# Patient Record
Sex: Male | Born: 1935 | Race: White | Hispanic: No | Marital: Married | Smoking: Former smoker
Health system: Southern US, Community
[De-identification: ages and names within clinical notes are randomized; demographics above are authoritative.]

## PROBLEM LIST (undated history)

## (undated) DIAGNOSIS — D649 Anemia, unspecified: Secondary | ICD-10-CM

## (undated) DIAGNOSIS — I739 Peripheral vascular disease, unspecified: Secondary | ICD-10-CM

## (undated) DIAGNOSIS — I2510 Atherosclerotic heart disease of native coronary artery without angina pectoris: Secondary | ICD-10-CM

## (undated) DIAGNOSIS — I472 Ventricular tachycardia: Secondary | ICD-10-CM

## (undated) DIAGNOSIS — N451 Epididymitis: Secondary | ICD-10-CM

## (undated) DIAGNOSIS — I480 Paroxysmal atrial fibrillation: Secondary | ICD-10-CM

## (undated) DIAGNOSIS — I447 Left bundle-branch block, unspecified: Secondary | ICD-10-CM

## (undated) DIAGNOSIS — D509 Iron deficiency anemia, unspecified: Secondary | ICD-10-CM

## (undated) DIAGNOSIS — I255 Ischemic cardiomyopathy: Secondary | ICD-10-CM

## (undated) DIAGNOSIS — I10 Essential (primary) hypertension: Secondary | ICD-10-CM

## (undated) DIAGNOSIS — E785 Hyperlipidemia, unspecified: Secondary | ICD-10-CM

## (undated) DIAGNOSIS — I509 Heart failure, unspecified: Secondary | ICD-10-CM

## (undated) DIAGNOSIS — C8590 Non-Hodgkin lymphoma, unspecified, unspecified site: Secondary | ICD-10-CM

## (undated) HISTORY — DX: Non-Hodgkin lymphoma, unspecified, unspecified site: C85.90

## (undated) HISTORY — DX: Paroxysmal atrial fibrillation: I48.0

## (undated) HISTORY — DX: Atherosclerotic heart disease of native coronary artery without angina pectoris: I25.10

## (undated) HISTORY — DX: Epididymitis: N45.1

## (undated) HISTORY — DX: Heart failure, unspecified: I50.9

## (undated) HISTORY — DX: Iron deficiency anemia, unspecified: D50.9

## (undated) HISTORY — PX: TONSILLECTOMY: SUR1361

## (undated) HISTORY — DX: Essential (primary) hypertension: I10

## (undated) HISTORY — PX: CORONARY ANGIOPLASTY WITH STENT PLACEMENT: SHX49

## (undated) HISTORY — DX: Hyperlipidemia, unspecified: E78.5

## (undated) HISTORY — DX: Ischemic cardiomyopathy: I25.5

## (undated) HISTORY — PX: ORCHIECTOMY: SHX2116

---

## 1986-07-07 HISTORY — PX: CORONARY ARTERY BYPASS GRAFT: SHX141

## 2005-02-14 ENCOUNTER — Encounter: Admission: RE | Admit: 2005-02-14 | Discharge: 2005-02-14 | Payer: Self-pay | Admitting: Family Medicine

## 2005-03-03 ENCOUNTER — Ambulatory Visit (HOSPITAL_COMMUNITY): Admission: RE | Admit: 2005-03-03 | Discharge: 2005-03-03 | Payer: Self-pay | Admitting: Otolaryngology

## 2005-03-03 ENCOUNTER — Encounter (INDEPENDENT_AMBULATORY_CARE_PROVIDER_SITE_OTHER): Payer: Self-pay | Admitting: Otolaryngology

## 2005-03-03 ENCOUNTER — Encounter (INDEPENDENT_AMBULATORY_CARE_PROVIDER_SITE_OTHER): Payer: Self-pay | Admitting: *Deleted

## 2005-03-11 ENCOUNTER — Ambulatory Visit: Payer: Self-pay | Admitting: Oncology

## 2005-03-25 ENCOUNTER — Ambulatory Visit: Payer: Self-pay

## 2005-03-25 ENCOUNTER — Ambulatory Visit (HOSPITAL_COMMUNITY): Admission: RE | Admit: 2005-03-25 | Discharge: 2005-03-25 | Payer: Self-pay | Admitting: Oncology

## 2005-03-26 ENCOUNTER — Encounter (HOSPITAL_COMMUNITY): Payer: Self-pay | Admitting: Oncology

## 2005-03-26 ENCOUNTER — Encounter (INDEPENDENT_AMBULATORY_CARE_PROVIDER_SITE_OTHER): Payer: Self-pay | Admitting: *Deleted

## 2005-03-26 ENCOUNTER — Ambulatory Visit (HOSPITAL_COMMUNITY): Admission: RE | Admit: 2005-03-26 | Discharge: 2005-03-26 | Payer: Self-pay | Admitting: Oncology

## 2005-03-26 ENCOUNTER — Ambulatory Visit: Payer: Self-pay | Admitting: Oncology

## 2005-04-08 ENCOUNTER — Ambulatory Visit (HOSPITAL_COMMUNITY): Admission: RE | Admit: 2005-04-08 | Discharge: 2005-04-08 | Payer: Self-pay | Admitting: Oncology

## 2005-04-08 ENCOUNTER — Ambulatory Visit: Admission: RE | Admit: 2005-04-08 | Discharge: 2005-04-18 | Payer: Self-pay | Admitting: Radiation Oncology

## 2005-04-28 ENCOUNTER — Ambulatory Visit: Payer: Self-pay | Admitting: Oncology

## 2005-05-05 ENCOUNTER — Encounter: Admission: RE | Admit: 2005-05-05 | Discharge: 2005-05-05 | Payer: Self-pay | Admitting: Dentistry

## 2005-05-05 ENCOUNTER — Ambulatory Visit: Payer: Self-pay | Admitting: Dentistry

## 2005-06-24 ENCOUNTER — Ambulatory Visit (HOSPITAL_COMMUNITY): Admission: RE | Admit: 2005-06-24 | Discharge: 2005-06-24 | Payer: Self-pay | Admitting: Oncology

## 2005-07-01 ENCOUNTER — Ambulatory Visit: Payer: Self-pay | Admitting: Oncology

## 2005-07-02 ENCOUNTER — Ambulatory Visit: Admission: RE | Admit: 2005-07-02 | Discharge: 2005-09-12 | Payer: Self-pay | Admitting: Oncology

## 2005-08-07 ENCOUNTER — Emergency Department (HOSPITAL_COMMUNITY): Admission: EM | Admit: 2005-08-07 | Discharge: 2005-08-08 | Payer: Self-pay | Admitting: Emergency Medicine

## 2005-08-22 ENCOUNTER — Ambulatory Visit: Payer: Self-pay | Admitting: Oncology

## 2005-10-20 ENCOUNTER — Ambulatory Visit: Payer: Self-pay | Admitting: Oncology

## 2005-10-20 LAB — COMPREHENSIVE METABOLIC PANEL
ALT: 9 U/L (ref 0–40)
AST: 15 U/L (ref 0–37)
Alkaline Phosphatase: 81 U/L (ref 39–117)
BUN: 10 mg/dL (ref 6–23)
Calcium: 8.9 mg/dL (ref 8.4–10.5)
Chloride: 103 mEq/L (ref 96–112)
Creatinine, Ser: 0.7 mg/dL (ref 0.4–1.5)
Total Bilirubin: 1 mg/dL (ref 0.3–1.2)

## 2005-10-20 LAB — CBC WITH DIFFERENTIAL/PLATELET
BASO%: 0.6 % (ref 0.0–2.0)
Basophils Absolute: 0 10*3/uL (ref 0.0–0.1)
EOS%: 2.5 % (ref 0.0–7.0)
HCT: 36.8 % — ABNORMAL LOW (ref 38.7–49.9)
HGB: 12.8 g/dL — ABNORMAL LOW (ref 13.0–17.1)
LYMPH%: 24.1 % (ref 14.0–48.0)
MCH: 30.3 pg (ref 28.0–33.4)
MCHC: 34.7 g/dL (ref 32.0–35.9)
MCV: 87.3 fL (ref 81.6–98.0)
MONO%: 14.6 % — ABNORMAL HIGH (ref 0.0–13.0)
NEUT%: 58.2 % (ref 40.0–75.0)
lymph#: 0.8 10*3/uL — ABNORMAL LOW (ref 0.9–3.3)

## 2005-12-02 ENCOUNTER — Ambulatory Visit: Payer: Self-pay | Admitting: Oncology

## 2005-12-02 LAB — COMPREHENSIVE METABOLIC PANEL
ALT: 8 U/L (ref 0–40)
AST: 18 U/L (ref 0–37)
Alkaline Phosphatase: 75 U/L (ref 39–117)
CO2: 27 mEq/L (ref 19–32)
Sodium: 140 mEq/L (ref 135–145)
Total Bilirubin: 0.9 mg/dL (ref 0.3–1.2)
Total Protein: 6 g/dL (ref 6.0–8.3)

## 2005-12-02 LAB — CBC WITH DIFFERENTIAL/PLATELET
BASO%: 0.1 % (ref 0.0–2.0)
EOS%: 1.8 % (ref 0.0–7.0)
LYMPH%: 18.8 % (ref 14.0–48.0)
MCHC: 34.4 g/dL (ref 32.0–35.9)
MONO#: 0.3 10*3/uL (ref 0.1–0.9)
Platelets: 166 10*3/uL (ref 145–400)
RBC: 4.33 10*6/uL (ref 4.20–5.71)
WBC: 4.5 10*3/uL (ref 4.0–10.0)

## 2005-12-02 LAB — LACTATE DEHYDROGENASE: LDH: 203 U/L (ref 94–250)

## 2005-12-23 LAB — CBC WITH DIFFERENTIAL/PLATELET
Basophils Absolute: 0 10*3/uL (ref 0.0–0.1)
Eosinophils Absolute: 0.1 10*3/uL (ref 0.0–0.5)
HGB: 13.1 g/dL (ref 13.0–17.1)
LYMPH%: 17.2 % (ref 14.0–48.0)
MCV: 87.4 fL (ref 81.6–98.0)
MONO%: 8.2 % (ref 0.0–13.0)
NEUT#: 3.8 10*3/uL (ref 1.5–6.5)
NEUT%: 71.5 % (ref 40.0–75.0)
Platelets: 187 10*3/uL (ref 145–400)

## 2005-12-30 LAB — CBC WITH DIFFERENTIAL/PLATELET
BASO%: 0.6 % (ref 0.0–2.0)
Eosinophils Absolute: 0.1 10*3/uL (ref 0.0–0.5)
LYMPH%: 16.8 % (ref 14.0–48.0)
MCHC: 34.8 g/dL (ref 32.0–35.9)
MCV: 86.5 fL (ref 81.6–98.0)
MONO%: 8 % (ref 0.0–13.0)
Platelets: 166 10*3/uL (ref 145–400)
RBC: 4.5 10*6/uL (ref 4.20–5.71)

## 2006-01-06 LAB — CBC WITH DIFFERENTIAL/PLATELET
BASO%: 0.5 % (ref 0.0–2.0)
Basophils Absolute: 0 10*3/uL (ref 0.0–0.1)
Eosinophils Absolute: 0.1 10*3/uL (ref 0.0–0.5)
HCT: 38.8 % (ref 38.7–49.9)
HGB: 13.5 g/dL (ref 13.0–17.1)
LYMPH%: 16.9 % (ref 14.0–48.0)
MCHC: 34.9 g/dL (ref 32.0–35.9)
MONO#: 0.5 10*3/uL (ref 0.1–0.9)
NEUT%: 70.3 % (ref 40.0–75.0)
Platelets: 171 10*3/uL (ref 145–400)
WBC: 5.4 10*3/uL (ref 4.0–10.0)

## 2006-01-13 ENCOUNTER — Inpatient Hospital Stay (HOSPITAL_COMMUNITY): Admission: EM | Admit: 2006-01-13 | Discharge: 2006-01-16 | Payer: Self-pay | Admitting: Emergency Medicine

## 2006-01-13 LAB — CBC WITH DIFFERENTIAL/PLATELET
BASO%: 0.2 % (ref 0.0–2.0)
Basophils Absolute: 0 10*3/uL (ref 0.0–0.1)
EOS%: 1.3 % (ref 0.0–7.0)
HCT: 37.7 % — ABNORMAL LOW (ref 38.7–49.9)
HGB: 13 g/dL (ref 13.0–17.1)
LYMPH%: 6.2 % — ABNORMAL LOW (ref 14.0–48.0)
MCH: 30.3 pg (ref 28.0–33.4)
MCHC: 34.3 g/dL (ref 32.0–35.9)
MCV: 88.3 fL (ref 81.6–98.0)
MONO%: 9.6 % (ref 0.0–13.0)
NEUT%: 82.7 % — ABNORMAL HIGH (ref 40.0–75.0)
Platelets: 176 10*3/uL (ref 145–400)

## 2006-01-13 LAB — COMPREHENSIVE METABOLIC PANEL
ALT: 137 U/L — ABNORMAL HIGH (ref 0–40)
AST: 134 U/L — ABNORMAL HIGH (ref 0–37)
BUN: 13 mg/dL (ref 6–23)
Calcium: 8.7 mg/dL (ref 8.4–10.5)
Creatinine, Ser: 0.86 mg/dL (ref 0.40–1.50)
Total Bilirubin: 4.3 mg/dL — ABNORMAL HIGH (ref 0.3–1.2)

## 2006-01-14 ENCOUNTER — Encounter (INDEPENDENT_AMBULATORY_CARE_PROVIDER_SITE_OTHER): Payer: Self-pay | Admitting: Cardiology

## 2006-01-15 ENCOUNTER — Ambulatory Visit: Payer: Self-pay | Admitting: Oncology

## 2006-01-16 ENCOUNTER — Encounter (HOSPITAL_COMMUNITY): Admission: RE | Admit: 2006-01-16 | Discharge: 2006-03-18 | Payer: Self-pay | Admitting: Cardiology

## 2006-03-20 ENCOUNTER — Ambulatory Visit: Payer: Self-pay | Admitting: Oncology

## 2006-03-24 LAB — CBC WITH DIFFERENTIAL/PLATELET
BASO%: 0.4 % (ref 0.0–2.0)
Eosinophils Absolute: 0.1 10*3/uL (ref 0.0–0.5)
HCT: 36.6 % — ABNORMAL LOW (ref 38.7–49.9)
HGB: 12.6 g/dL — ABNORMAL LOW (ref 13.0–17.1)
LYMPH%: 17.7 % (ref 14.0–48.0)
MCH: 30.8 pg (ref 28.0–33.4)
MONO%: 9 % (ref 0.0–13.0)
NEUT#: 3.4 10*3/uL (ref 1.5–6.5)
WBC: 4.9 10*3/uL (ref 4.0–10.0)

## 2006-03-24 LAB — COMPREHENSIVE METABOLIC PANEL
Alkaline Phosphatase: 83 U/L (ref 39–117)
Creatinine, Ser: 0.7 mg/dL (ref 0.40–1.50)
Glucose, Bld: 104 mg/dL — ABNORMAL HIGH (ref 70–99)
Sodium: 141 mEq/L (ref 135–145)
Total Bilirubin: 1.2 mg/dL (ref 0.3–1.2)
Total Protein: 6.3 g/dL (ref 6.0–8.3)

## 2006-05-19 ENCOUNTER — Ambulatory Visit: Payer: Self-pay | Admitting: Oncology

## 2006-05-21 LAB — COMPREHENSIVE METABOLIC PANEL WITH GFR
ALT: 11 U/L (ref 0–53)
AST: 13 U/L (ref 0–37)
Albumin: 3.8 g/dL (ref 3.5–5.2)
Alkaline Phosphatase: 75 U/L (ref 39–117)
BUN: 10 mg/dL (ref 6–23)
CO2: 29 meq/L (ref 19–32)
Calcium: 9 mg/dL (ref 8.4–10.5)
Chloride: 104 meq/L (ref 96–112)
Creatinine, Ser: 0.77 mg/dL (ref 0.40–1.50)
Glucose, Bld: 98 mg/dL (ref 70–99)
Potassium: 4 meq/L (ref 3.5–5.3)
Sodium: 139 meq/L (ref 135–145)
Total Bilirubin: 0.9 mg/dL (ref 0.3–1.2)
Total Protein: 6.2 g/dL (ref 6.0–8.3)

## 2006-05-21 LAB — CBC WITH DIFFERENTIAL/PLATELET
BASO%: 0.3 % (ref 0.0–2.0)
Basophils Absolute: 0 10e3/uL (ref 0.0–0.1)
EOS%: 2.3 % (ref 0.0–7.0)
Eosinophils Absolute: 0.1 10e3/uL (ref 0.0–0.5)
HCT: 37.3 % — ABNORMAL LOW (ref 38.7–49.9)
HGB: 12.8 g/dL — ABNORMAL LOW (ref 13.0–17.1)
LYMPH%: 13.9 % — ABNORMAL LOW (ref 14.0–48.0)
MCH: 30.5 pg (ref 28.0–33.4)
MCHC: 34.5 g/dL (ref 32.0–35.9)
MCV: 88.7 fL (ref 81.6–98.0)
MONO#: 0.4 10e3/uL (ref 0.1–0.9)
MONO%: 7.5 % (ref 0.0–13.0)
NEUT#: 4.4 10e3/uL (ref 1.5–6.5)
NEUT%: 76 % — ABNORMAL HIGH (ref 40.0–75.0)
Platelets: 200 10e3/uL (ref 145–400)
RBC: 4.2 10e6/uL (ref 4.20–5.71)
RDW: 14 % (ref 11.2–14.6)
WBC: 5.8 10e3/uL (ref 4.0–10.0)
lymph#: 0.8 10e3/uL — ABNORMAL LOW (ref 0.9–3.3)

## 2006-05-21 LAB — LACTATE DEHYDROGENASE: LDH: 126 U/L (ref 94–250)

## 2006-06-04 LAB — CBC WITH DIFFERENTIAL/PLATELET
Basophils Absolute: 0 10*3/uL (ref 0.0–0.1)
Eosinophils Absolute: 0.1 10*3/uL (ref 0.0–0.5)
HGB: 13.1 g/dL (ref 13.0–17.1)
LYMPH%: 14 % (ref 14.0–48.0)
MCV: 86.7 fL (ref 81.6–98.0)
MONO%: 7.3 % (ref 0.0–13.0)
NEUT#: 4.8 10*3/uL (ref 1.5–6.5)
NEUT%: 76 % — ABNORMAL HIGH (ref 40.0–75.0)
Platelets: 186 10*3/uL (ref 145–400)

## 2006-08-03 ENCOUNTER — Ambulatory Visit: Payer: Self-pay | Admitting: Oncology

## 2006-08-06 LAB — COMPREHENSIVE METABOLIC PANEL
Albumin: 3.9 g/dL (ref 3.5–5.2)
Alkaline Phosphatase: 81 U/L (ref 39–117)
BUN: 11 mg/dL (ref 6–23)
Calcium: 9.1 mg/dL (ref 8.4–10.5)
Creatinine, Ser: 0.81 mg/dL (ref 0.40–1.50)
Glucose, Bld: 90 mg/dL (ref 70–99)
Potassium: 4.1 mEq/L (ref 3.5–5.3)

## 2006-08-06 LAB — CBC WITH DIFFERENTIAL/PLATELET
Basophils Absolute: 0 10*3/uL (ref 0.0–0.1)
EOS%: 2.1 % (ref 0.0–7.0)
Eosinophils Absolute: 0.1 10*3/uL (ref 0.0–0.5)
HCT: 38.8 % (ref 38.7–49.9)
HGB: 13.3 g/dL (ref 13.0–17.1)
MCH: 30.6 pg (ref 28.0–33.4)
MCV: 89.2 fL (ref 81.6–98.0)
MONO%: 10.7 % (ref 0.0–13.0)
NEUT#: 4.1 10*3/uL (ref 1.5–6.5)
NEUT%: 68.2 % (ref 40.0–75.0)
Platelets: 199 10*3/uL (ref 145–400)
RDW: 14.7 % — ABNORMAL HIGH (ref 11.2–14.6)

## 2006-08-06 LAB — LACTATE DEHYDROGENASE: LDH: 139 U/L (ref 94–250)

## 2006-09-03 LAB — CBC WITH DIFFERENTIAL/PLATELET
Basophils Absolute: 0 10*3/uL (ref 0.0–0.1)
EOS%: 2.2 % (ref 0.0–7.0)
HCT: 36.6 % — ABNORMAL LOW (ref 38.7–49.9)
HGB: 13.3 g/dL (ref 13.0–17.1)
LYMPH%: 19.6 % (ref 14.0–48.0)
MCH: 30.8 pg (ref 28.0–33.4)
MONO#: 0.4 10*3/uL (ref 0.1–0.9)
NEUT%: 69.5 % (ref 40.0–75.0)
Platelets: 190 10*3/uL (ref 145–400)
lymph#: 1 10*3/uL (ref 0.9–3.3)

## 2006-09-29 ENCOUNTER — Ambulatory Visit: Payer: Self-pay | Admitting: Oncology

## 2006-10-01 LAB — COMPREHENSIVE METABOLIC PANEL
AST: 14 U/L (ref 0–37)
Alkaline Phosphatase: 80 U/L (ref 39–117)
BUN: 11 mg/dL (ref 6–23)
Creatinine, Ser: 0.81 mg/dL (ref 0.40–1.50)
Total Bilirubin: 0.8 mg/dL (ref 0.3–1.2)

## 2006-10-01 LAB — CBC WITH DIFFERENTIAL/PLATELET
Basophils Absolute: 0 10*3/uL (ref 0.0–0.1)
EOS%: 2.2 % (ref 0.0–7.0)
HCT: 36.1 % — ABNORMAL LOW (ref 38.7–49.9)
HGB: 12.8 g/dL — ABNORMAL LOW (ref 13.0–17.1)
MCH: 31 pg (ref 28.0–33.4)
MCV: 87.6 fL (ref 81.6–98.0)
MONO%: 8 % (ref 0.0–13.0)
NEUT%: 72.7 % (ref 40.0–75.0)

## 2006-11-25 ENCOUNTER — Ambulatory Visit: Payer: Self-pay | Admitting: Oncology

## 2006-11-27 LAB — CBC WITH DIFFERENTIAL/PLATELET
BASO%: 0.5 % (ref 0.0–2.0)
LYMPH%: 17.1 % (ref 14.0–48.0)
MCHC: 35.3 g/dL (ref 32.0–35.9)
MCV: 87.6 fL (ref 81.6–98.0)
MONO#: 0.6 10*3/uL (ref 0.1–0.9)
MONO%: 9.2 % (ref 0.0–13.0)
Platelets: 193 10*3/uL (ref 145–400)
RBC: 4.15 10*6/uL — ABNORMAL LOW (ref 4.20–5.71)
RDW: 12.7 % (ref 11.2–14.6)
WBC: 6.2 10*3/uL (ref 4.0–10.0)

## 2006-11-27 LAB — COMPREHENSIVE METABOLIC PANEL
ALT: 13 U/L (ref 0–53)
Alkaline Phosphatase: 83 U/L (ref 39–117)
Potassium: 3.7 mEq/L (ref 3.5–5.3)
Sodium: 140 mEq/L (ref 135–145)
Total Bilirubin: 1.1 mg/dL (ref 0.3–1.2)
Total Protein: 6.8 g/dL (ref 6.0–8.3)

## 2007-02-21 ENCOUNTER — Ambulatory Visit: Payer: Self-pay | Admitting: Oncology

## 2007-02-25 ENCOUNTER — Ambulatory Visit (HOSPITAL_COMMUNITY): Admission: RE | Admit: 2007-02-25 | Discharge: 2007-02-25 | Payer: Self-pay | Admitting: Oncology

## 2007-02-25 LAB — CBC WITH DIFFERENTIAL/PLATELET
BASO%: 0.7 % (ref 0.0–2.0)
EOS%: 2 % (ref 0.0–7.0)
HCT: 37.4 % — ABNORMAL LOW (ref 38.7–49.9)
LYMPH%: 15 % (ref 14.0–48.0)
MCH: 31.2 pg (ref 28.0–33.4)
MCHC: 36.1 g/dL — ABNORMAL HIGH (ref 32.0–35.9)
MCV: 86.6 fL (ref 81.6–98.0)
MONO%: 6.5 % (ref 0.0–13.0)
NEUT%: 75.8 % — ABNORMAL HIGH (ref 40.0–75.0)
Platelets: 184 10*3/uL (ref 145–400)

## 2007-02-25 LAB — COMPREHENSIVE METABOLIC PANEL
ALT: 12 U/L (ref 0–53)
AST: 16 U/L (ref 0–37)
CO2: 26 mEq/L (ref 19–32)
Creatinine, Ser: 0.91 mg/dL (ref 0.40–1.50)
Total Bilirubin: 0.8 mg/dL (ref 0.3–1.2)

## 2007-02-25 LAB — LACTATE DEHYDROGENASE: LDH: 141 U/L (ref 94–250)

## 2007-06-04 ENCOUNTER — Emergency Department (HOSPITAL_COMMUNITY): Admission: EM | Admit: 2007-06-04 | Discharge: 2007-06-04 | Payer: Self-pay | Admitting: Emergency Medicine

## 2007-06-08 ENCOUNTER — Ambulatory Visit: Payer: Self-pay | Admitting: Oncology

## 2007-06-14 ENCOUNTER — Encounter (INDEPENDENT_AMBULATORY_CARE_PROVIDER_SITE_OTHER): Payer: Self-pay | Admitting: Urology

## 2007-06-14 ENCOUNTER — Ambulatory Visit (HOSPITAL_COMMUNITY): Admission: RE | Admit: 2007-06-14 | Discharge: 2007-06-15 | Payer: Self-pay | Admitting: Urology

## 2007-07-12 LAB — CBC WITH DIFFERENTIAL/PLATELET
Basophils Absolute: 0 10*3/uL (ref 0.0–0.1)
EOS%: 2.2 % (ref 0.0–7.0)
Eosinophils Absolute: 0.2 10*3/uL (ref 0.0–0.5)
HGB: 12.4 g/dL — ABNORMAL LOW (ref 13.0–17.1)
LYMPH%: 13.8 % — ABNORMAL LOW (ref 14.0–48.0)
MCH: 29.1 pg (ref 28.0–33.4)
MCV: 85.1 fL (ref 81.6–98.0)
MONO%: 7.6 % (ref 0.0–13.0)
NEUT#: 6.1 10*3/uL (ref 1.5–6.5)
Platelets: 202 10*3/uL (ref 145–400)

## 2007-07-12 LAB — COMPREHENSIVE METABOLIC PANEL
AST: 10 U/L (ref 0–37)
Alkaline Phosphatase: 69 U/L (ref 39–117)
BUN: 16 mg/dL (ref 6–23)
Creatinine, Ser: 0.75 mg/dL (ref 0.40–1.50)
Glucose, Bld: 182 mg/dL — ABNORMAL HIGH (ref 70–99)
Total Bilirubin: 0.9 mg/dL (ref 0.3–1.2)

## 2007-10-07 ENCOUNTER — Ambulatory Visit: Payer: Self-pay | Admitting: Oncology

## 2007-10-11 LAB — COMPREHENSIVE METABOLIC PANEL
ALT: 9 U/L (ref 0–53)
AST: 13 U/L (ref 0–37)
Albumin: 4.1 g/dL (ref 3.5–5.2)
Alkaline Phosphatase: 64 U/L (ref 39–117)
Potassium: 3.5 mEq/L (ref 3.5–5.3)
Sodium: 141 mEq/L (ref 135–145)
Total Protein: 6.4 g/dL (ref 6.0–8.3)

## 2007-10-11 LAB — CBC WITH DIFFERENTIAL/PLATELET
EOS%: 2.9 % (ref 0.0–7.0)
MCH: 30.5 pg (ref 28.0–33.4)
MCV: 87.5 fL (ref 81.6–98.0)
MONO%: 6.9 % (ref 0.0–13.0)
NEUT#: 6.1 10*3/uL (ref 1.5–6.5)
RBC: 4.03 10*6/uL — ABNORMAL LOW (ref 4.20–5.71)
RDW: 14.6 % (ref 11.2–14.6)

## 2008-01-05 ENCOUNTER — Ambulatory Visit: Payer: Self-pay | Admitting: Oncology

## 2008-01-10 ENCOUNTER — Ambulatory Visit (HOSPITAL_COMMUNITY): Admission: RE | Admit: 2008-01-10 | Discharge: 2008-01-10 | Payer: Self-pay | Admitting: Oncology

## 2008-01-10 LAB — COMPREHENSIVE METABOLIC PANEL
ALT: 14 U/L (ref 0–53)
AST: 17 U/L (ref 0–37)
Albumin: 4 g/dL (ref 3.5–5.2)
BUN: 12 mg/dL (ref 6–23)
CO2: 28 mEq/L (ref 19–32)
Calcium: 8.6 mg/dL (ref 8.4–10.5)
Chloride: 101 mEq/L (ref 96–112)
Potassium: 3.5 mEq/L (ref 3.5–5.3)

## 2008-01-10 LAB — LACTATE DEHYDROGENASE: LDH: 149 U/L (ref 94–250)

## 2008-01-10 LAB — CBC WITH DIFFERENTIAL/PLATELET
Basophils Absolute: 0 10*3/uL (ref 0.0–0.1)
EOS%: 4 % (ref 0.0–7.0)
HCT: 33.8 % — ABNORMAL LOW (ref 38.7–49.9)
HGB: 11.8 g/dL — ABNORMAL LOW (ref 13.0–17.1)
MCH: 30.2 pg (ref 28.0–33.4)
MONO#: 0.5 10*3/uL (ref 0.1–0.9)
NEUT#: 5.1 10*3/uL (ref 1.5–6.5)
RDW: 15.2 % — ABNORMAL HIGH (ref 11.2–14.6)
WBC: 6.7 10*3/uL (ref 4.0–10.0)
lymph#: 0.8 10*3/uL — ABNORMAL LOW (ref 0.9–3.3)

## 2008-04-13 ENCOUNTER — Ambulatory Visit: Payer: Self-pay | Admitting: Oncology

## 2008-04-17 ENCOUNTER — Ambulatory Visit (HOSPITAL_COMMUNITY): Admission: RE | Admit: 2008-04-17 | Discharge: 2008-04-17 | Payer: Self-pay | Admitting: Oncology

## 2008-05-11 LAB — COMPREHENSIVE METABOLIC PANEL
BUN: 12 mg/dL (ref 6–23)
CO2: 27 mEq/L (ref 19–32)
Calcium: 8.4 mg/dL (ref 8.4–10.5)
Chloride: 102 mEq/L (ref 96–112)
Creatinine, Ser: 0.89 mg/dL (ref 0.40–1.50)
Glucose, Bld: 98 mg/dL (ref 70–99)
Total Bilirubin: 0.9 mg/dL (ref 0.3–1.2)

## 2008-05-11 LAB — CBC WITH DIFFERENTIAL/PLATELET
Basophils Absolute: 0 10*3/uL (ref 0.0–0.1)
Eosinophils Absolute: 0.2 10*3/uL (ref 0.0–0.5)
LYMPH%: 12.9 % — ABNORMAL LOW (ref 14.0–48.0)
MCH: 29.5 pg (ref 28.0–33.4)
MCV: 87.1 fL (ref 81.6–98.0)
NEUT#: 5.8 10*3/uL (ref 1.5–6.5)
NEUT%: 74.6 % (ref 40.0–75.0)
RBC: 4.16 10*6/uL — ABNORMAL LOW (ref 4.20–5.71)
RDW: 15.3 % — ABNORMAL HIGH (ref 11.2–14.6)

## 2008-05-11 LAB — LACTATE DEHYDROGENASE: LDH: 132 U/L (ref 94–250)

## 2008-09-06 ENCOUNTER — Ambulatory Visit: Payer: Self-pay | Admitting: Oncology

## 2008-10-16 LAB — CBC WITH DIFFERENTIAL/PLATELET
Basophils Absolute: 0 10*3/uL (ref 0.0–0.1)
EOS%: 2.4 % (ref 0.0–7.0)
Eosinophils Absolute: 0.2 10*3/uL (ref 0.0–0.5)
HGB: 11.4 g/dL — ABNORMAL LOW (ref 13.0–17.1)
MCH: 28.4 pg (ref 27.2–33.4)
MONO#: 0.5 10*3/uL (ref 0.1–0.9)
NEUT#: 4.8 10*3/uL (ref 1.5–6.5)
RDW: 15.4 % — ABNORMAL HIGH (ref 11.0–14.6)
WBC: 6.4 10*3/uL (ref 4.0–10.3)
lymph#: 0.9 10*3/uL (ref 0.9–3.3)

## 2008-10-16 LAB — COMPREHENSIVE METABOLIC PANEL
AST: 15 U/L (ref 0–37)
Albumin: 4 g/dL (ref 3.5–5.2)
BUN: 14 mg/dL (ref 6–23)
Calcium: 8.9 mg/dL (ref 8.4–10.5)
Chloride: 102 mEq/L (ref 96–112)
Potassium: 3.6 mEq/L (ref 3.5–5.3)

## 2009-02-20 ENCOUNTER — Ambulatory Visit: Payer: Self-pay | Admitting: Oncology

## 2009-02-22 LAB — COMPREHENSIVE METABOLIC PANEL
BUN: 16 mg/dL (ref 6–23)
CO2: 29 mEq/L (ref 19–32)
Calcium: 8.7 mg/dL (ref 8.4–10.5)
Chloride: 101 mEq/L (ref 96–112)
Creatinine, Ser: 0.91 mg/dL (ref 0.40–1.50)
Glucose, Bld: 104 mg/dL — ABNORMAL HIGH (ref 70–99)

## 2009-02-22 LAB — CBC WITH DIFFERENTIAL/PLATELET
Basophils Absolute: 0 10*3/uL (ref 0.0–0.1)
HCT: 33 % — ABNORMAL LOW (ref 38.4–49.9)
HGB: 11 g/dL — ABNORMAL LOW (ref 13.0–17.1)
MCH: 27.7 pg (ref 27.2–33.4)
MONO#: 0.4 10*3/uL (ref 0.1–0.9)
NEUT%: 76.2 % — ABNORMAL HIGH (ref 39.0–75.0)
Platelets: 180 10*3/uL (ref 140–400)
WBC: 6.4 10*3/uL (ref 4.0–10.3)
lymph#: 0.9 10*3/uL (ref 0.9–3.3)

## 2009-02-22 LAB — LACTATE DEHYDROGENASE: LDH: 131 U/L (ref 94–250)

## 2009-03-19 ENCOUNTER — Ambulatory Visit: Payer: Self-pay | Admitting: Vascular Surgery

## 2009-04-10 ENCOUNTER — Ambulatory Visit: Payer: Self-pay | Admitting: Vascular Surgery

## 2009-04-10 ENCOUNTER — Encounter: Admission: RE | Admit: 2009-04-10 | Discharge: 2009-04-10 | Payer: Self-pay | Admitting: Vascular Surgery

## 2009-05-01 ENCOUNTER — Ambulatory Visit: Payer: Self-pay | Admitting: Vascular Surgery

## 2009-05-07 DIAGNOSIS — I739 Peripheral vascular disease, unspecified: Secondary | ICD-10-CM

## 2009-05-07 HISTORY — DX: Peripheral vascular disease, unspecified: I73.9

## 2009-05-07 HISTORY — PX: ABDOMINAL AORTIC ANEURYSM REPAIR: SUR1152

## 2009-05-17 ENCOUNTER — Inpatient Hospital Stay (HOSPITAL_COMMUNITY): Admission: RE | Admit: 2009-05-17 | Discharge: 2009-05-26 | Payer: Self-pay | Admitting: Vascular Surgery

## 2009-05-17 ENCOUNTER — Encounter: Payer: Self-pay | Admitting: Vascular Surgery

## 2009-05-17 ENCOUNTER — Ambulatory Visit: Payer: Self-pay | Admitting: Vascular Surgery

## 2009-06-05 ENCOUNTER — Ambulatory Visit: Payer: Self-pay | Admitting: Vascular Surgery

## 2009-06-12 ENCOUNTER — Ambulatory Visit: Payer: Self-pay | Admitting: Vascular Surgery

## 2009-08-14 ENCOUNTER — Ambulatory Visit: Payer: Self-pay | Admitting: Vascular Surgery

## 2009-08-23 ENCOUNTER — Ambulatory Visit: Payer: Self-pay | Admitting: Oncology

## 2009-08-28 LAB — COMPREHENSIVE METABOLIC PANEL
CO2: 28 mEq/L (ref 19–32)
Glucose, Bld: 100 mg/dL — ABNORMAL HIGH (ref 70–99)
Sodium: 141 mEq/L (ref 135–145)
Total Bilirubin: 0.8 mg/dL (ref 0.3–1.2)
Total Protein: 5.9 g/dL — ABNORMAL LOW (ref 6.0–8.3)

## 2009-08-28 LAB — CBC WITH DIFFERENTIAL/PLATELET
Basophils Absolute: 0 10*3/uL (ref 0.0–0.1)
Eosinophils Absolute: 0.2 10*3/uL (ref 0.0–0.5)
HCT: 35.1 % — ABNORMAL LOW (ref 38.4–49.9)
HGB: 11.7 g/dL — ABNORMAL LOW (ref 13.0–17.1)
LYMPH%: 12.3 % — ABNORMAL LOW (ref 14.0–49.0)
MONO#: 0.5 10*3/uL (ref 0.1–0.9)
NEUT#: 5.1 10*3/uL (ref 1.5–6.5)
NEUT%: 76.9 % — ABNORMAL HIGH (ref 39.0–75.0)
Platelets: 204 10*3/uL (ref 140–400)
RBC: 4.28 10*6/uL (ref 4.20–5.82)
WBC: 6.6 10*3/uL (ref 4.0–10.3)

## 2009-08-28 LAB — LACTATE DEHYDROGENASE: LDH: 129 U/L (ref 94–250)

## 2010-03-19 ENCOUNTER — Ambulatory Visit: Payer: Self-pay | Admitting: Oncology

## 2010-04-16 LAB — CBC WITH DIFFERENTIAL/PLATELET
Basophils Absolute: 0 10*3/uL (ref 0.0–0.1)
EOS%: 1.9 % (ref 0.0–7.0)
Eosinophils Absolute: 0.1 10*3/uL (ref 0.0–0.5)
HGB: 11 g/dL — ABNORMAL LOW (ref 13.0–17.1)
LYMPH%: 15.5 % (ref 14.0–49.0)
MCH: 28 pg (ref 27.2–33.4)
MCV: 83.3 fL (ref 79.3–98.0)
MONO%: 9.1 % (ref 0.0–14.0)
NEUT#: 3.9 10*3/uL (ref 1.5–6.5)
Platelets: 178 10*3/uL (ref 140–400)

## 2010-04-16 LAB — COMPREHENSIVE METABOLIC PANEL
Alkaline Phosphatase: 67 U/L (ref 39–117)
BUN: 14 mg/dL (ref 6–23)
Creatinine, Ser: 0.81 mg/dL (ref 0.40–1.50)
Glucose, Bld: 94 mg/dL (ref 70–99)
Total Bilirubin: 0.9 mg/dL (ref 0.3–1.2)

## 2010-06-06 DIAGNOSIS — I472 Ventricular tachycardia, unspecified: Secondary | ICD-10-CM

## 2010-06-06 DIAGNOSIS — I480 Paroxysmal atrial fibrillation: Secondary | ICD-10-CM

## 2010-06-06 HISTORY — DX: Ventricular tachycardia: I47.2

## 2010-06-06 HISTORY — DX: Paroxysmal atrial fibrillation: I48.0

## 2010-06-06 HISTORY — DX: Ventricular tachycardia, unspecified: I47.20

## 2010-06-06 HISTORY — PX: CARDIAC DEFIBRILLATOR PLACEMENT: SHX171

## 2010-07-02 ENCOUNTER — Encounter: Payer: Self-pay | Admitting: Internal Medicine

## 2010-07-02 ENCOUNTER — Inpatient Hospital Stay (HOSPITAL_COMMUNITY)
Admission: EM | Admit: 2010-07-02 | Discharge: 2010-07-07 | Payer: Self-pay | Source: Home / Self Care | Attending: Cardiology | Admitting: Cardiology

## 2010-07-03 ENCOUNTER — Encounter (INDEPENDENT_AMBULATORY_CARE_PROVIDER_SITE_OTHER): Payer: Self-pay | Admitting: Cardiology

## 2010-07-04 ENCOUNTER — Encounter: Payer: Self-pay | Admitting: Internal Medicine

## 2010-07-09 ENCOUNTER — Encounter: Payer: Self-pay | Admitting: Internal Medicine

## 2010-07-25 NOTE — Discharge Summary (Signed)
NAME:  Dakota Liu, DOCTER NO.:  [id number]  MEDICAL RECORD NO.:  [id number]          PATIENT TYPE:  INP  LOCATION:  2925                         FACILITY:  [facility]  PHYSICIAN:  Thereasa Solo. Little, M.D. DATE OF BIRTH:  [birth year]  DATE OF ADMISSION:  07/02/2010 DATE OF DISCHARGE:  07/07/2010                              DISCHARGE SUMMARY   DISCHARGE DIAGNOSES: 1. Unstable angina. 2. Ventricular tachycardia on admission. 3. Known coronary artery disease with coronary artery bypass grafting     in 1998, catheterization this admission with subsequent right     coronary artery Integrity stenting after the vein graft. 4. Ischemic cardiomyopathy with an ejection fraction of 30%, status     post biventricular implantable cardioverter-defibrillator this     admission. 5. History of lymphoma and with metal radiation 5 years ago. 6. Treated hypertension. 7. Treated dyslipidemia. 8. Vascular disease with remote abdominal aortic aneurysm repair. 9. Past history of renal insufficiency, stable this admission.  HOSPITAL COURSE:  The patient is an 77 year old male followed by Dr. Clarene Duke.  He has coronary artery disease.  He had an MI in 1986 and then bypass grafting in 1988.  He had a Myoview a year ago that was low risk. He does have cardiomyopathy with an EF of 30%.  He had abdominal aortic aneurysm repair and grafting without problems.  On the night of admission, he was awakened with chest pain and tachycardia.  In the emergency room, he was noted to be in wide complex tachycardia that was suspected to be either atrial fibrillation with bundle-branch block for v tach.  Catheterization showed patency of the patient's grafts, the LIMA to the LAD was patent, the SVG to diagonal was patent, SVG to OM-1, OM-2, and circumflex was patent, and the SVG to the RCA was patent.  The abdominal aortic graft was patent.  Renal and iliac arteries were patent.  His EF was 30%.  He did have  distal disease in the RCA after the graft insertion.  He underwent elective intervention on July 04, 2010, by Dr. Tresa Endo.  The patient was seen in consult by the EP Service for possible BiV ICD.  They felt he had sustained monomorphic v tach in the setting of ischemic cardiomyopathy.  They also felt he had atrial fibrillation.  He underwent intervention on July 04, 2010, and then [facility] ICD implant on July 05, 2010.  He tolerated this well.  We feel he can be discharged on July 07, 2010.  He will follow up with Dr. Graciela Husbands for a site check and also Dr. Clarene Duke.  We may need to consider anticoagulation for atrial fibrillation, although at discharge he is in sinus rhythm.  This can be reviewed further by Dr. Clarene Duke as an outpatient.  LABORATORY DATA AT DISCHARGE:  White count 10.6, hemoglobin 10.2, hematocrit 32.6, and platelets 144.  Sodium 134, potassium 3.4, BUN 13, and creatinine 0.99.  Chest x-ray shows mild interstitial edema on July 06, 2010, and the patient was given extra Lasix and diuresed.  DISCHARGE MEDICATIONS:  Please see med rec, there was some confusion about his home medicines, I  believe we thought he was on Benicar/HCTZ and this was continued while he was in the hospital.  He seems to be tolerating this well, so we will send him home on this.  According to the office records, it looks like this was stopped some time ago.  This will need be followed up with Dr. Clarene Duke.  He will need a BMP next week as well as a pacer site check.  He will need to see Dr. Clarene Duke in followup, we will discuss further with Dr. Clarene Duke and Dr. Tresa Endo whether or not he needs Coumadin.     Abelino Derrick, P.A.   ______________________________ Thereasa Solo. Little, M.D.    [name]  D:  07/07/2010  T:  07/08/2010  Job:  [id number]  cc:   Duke Salvia, MD, [facility]  Electronically Signed by Corine Shelter P.A. on 07/10/2010 04:02:20 PM Electronically Signed by Julieanne Manson M.D. on  07/25/2010 02:31:42 PM

## 2010-08-08 NOTE — Miscellaneous (Signed)
Summary: Device preload  Clinical Lists Changes  Observations: Added new observation of ICD INDICATN: ICM (07/09/2010 14:05) Added new observation of ICDLEADSTAT3: active (07/09/2010 14:05) Added new observation of ICDLEADSER3: [id number] (07/09/2010 14:05) Added new observation of ICDLEADMOD3: 4196  (07/09/2010 14:05) Added new observation of ICDLEADLOC3: LV  (07/09/2010 14:05) Added new observation of ICDLEADSTAT2: active  (07/09/2010 14:05) Added new observation of ICDLEADSER2: [id number]  (07/09/2010 14:05) Added new observation of ICDLEADMOD2: 7829  (07/09/2010 14:05) Added new observation of ICDLEADLOC2: RV  (07/09/2010 14:05) Added new observation of ICDLEADSTAT1: active  (07/09/2010 14:05) Added new observation of ICDLEADSER1: [id number]  (07/09/2010 14:05) Added new observation of ICDLEADMOD1: 5076  (07/09/2010 14:05) Added new observation of ICDLEADLOC1: RA  (07/09/2010 14:05) Added new observation of ICD IMP MD: Lewayne Bunting, MD  (07/09/2010 14:05) Added new observation of ICDLEADDOI3: 07/05/2010  (07/09/2010 14:05) Added new observation of ICDLEADDOI2: 07/05/2010  (07/09/2010 14:05) Added new observation of ICDLEADDOI1: 07/05/2010  (07/09/2010 14:05) Added new observation of ICD IMPL DTE: 07/05/2010  (07/09/2010 14:05) Added new observation of ICD SERL#: [id number]  (07/09/2010 14:05) Added new observation of ICD MODL#: [id number]  (07/09/2010 14:05) Added new observation of ICDMANUFACTR: Medtronic  (07/09/2010 14:05) Added new observation of ICD MD: Lewayne Bunting, MD  (07/09/2010 14:05)       ICD Specifications Following MD:  Lewayne Bunting, MD     ICD Vendor:  Medtronic     ICD Model Number:  [id number]     ICD Serial Number:  [id number] ICD DOI:  07/05/2010     ICD Implanting MD:  Lewayne Bunting, MD  Lead 1:    Location: RA     DOI: 07/05/2010     Model #: [id number]     Serial #: [id number]     Status: active Lead 2:    Location: RV     DOI: 07/05/2010     Model #: [id number]      Serial #: [id number]     Status: active Lead 3:    Location: LV     DOI: 07/05/2010     Model #: [id number]     Serial #: [id number]     Status: active  Indications::  ICM

## 2010-08-15 NOTE — Op Note (Signed)
NAME:  Dakota Liu, Dakota Liu NO.:  [id number]  MEDICAL RECORD NO.:  [id number]          PATIENT TYPE:  INP  LOCATION:  2925                         FACILITY:  [facility]  PHYSICIAN:  Doylene Canning. Ladona Ridgel, MD    DATE OF BIRTH:  04/24/1928  DATE OF PROCEDURE:  07/05/2010 DATE OF DISCHARGE:                              OPERATIVE REPORT   PROCEDURE PERFORMED:  Biventricular implantable cardioverter- defibrillator insertion.  INDICATIONS:  Ischemic cardiomyopathy, EF 30%, left bundle-branch block with sustained hemodynamically unstable monomorphic ventricular tachycardia in the setting of class III congestive heart failure.  INTRODUCTION:  The patient is an 77 year old man who presented to the hospital with sustained hemodynamically unstable ventricular tachycardia.  He underwent catheterization which demonstrated severe three-vessel coronary artery disease with no culprit lesions.  His EF was 30% by echo.  His EKG demonstrated left bundle-branch block with a QRS duration of 150 milliseconds.  Of note, the patient's activity is markedly reduced in the last several months with class III heart failure symptoms and he is now referred for BiV ICD insertion.  PROCEDURE:  After informed consent was obtained, the patient was taken to the Diagnostic EP Lab in the fasting state.  After usual preparation and draping, intravenous fentanyl and midazolam was given for sedation. Initial attempts to puncture the subclavian vein were unsuccessful and venogram was carried out demonstrating that the vein was in fact much more caudal than normal.  Having demonstrated this, the vein was punctured x3 and the Medtronic model 6947 active fixation defibrillation lead, serial number [id number] was advanced into the right ventricle.  At the final site on the RV apical septum, the R-waves measured 15 mV and the pacing impedance of 500 ohms, threshold 0.8 volts at 0.5 milliseconds and there was a prominent  injury current with active fixation of lead.  A 10-volt pacing did not stimulate the diaphragm. With the ventricular lead in satisfactory position, attention was turned to placing the atrial lead and it was placed anterolateral wall of the right atrium where P-waves measured 2 mV and the pace impedance with lead actively fixed was 451 ohms.  The threshold was 0.4 volts at 0.5 milliseconds and again 10-volt pacing did not stimulate the diaphragm. There was again a prominent injury current.  Having accomplished the above, attention was then turned to placement of the LV pacing lead. The right atrium was quite large and the initial MB2 guiding catheter was unfit to cannulate the CS.  Utilizing the Medtronic extended hook guiding catheter, the 6-French hexapolar EP catheter was inserted into the guiding catheter and the coronary sinus cannulation was quite difficult.  Eventually utilizing coronary sinus electrograms, it was confirmed that the coronary sinus was cannulated and venography was carried out.  This demonstrated a fairly large posterior vein.  It also demonstrated a smaller lateral vein before terminating in the anterior vein which was also quite small.  Initially, the posterior vein was cannulated, but because of its large size it was thought not to be the best location for LV placement.  The more lateral vein was cannulated. This was a small vein, but coursed down  the posterolateral wall of the left ventricle.  This vein was cannulated and the Medtronic Attain Ability LV pacing lead, serial number [id number] was advanced into this lateral vein.  Despite being positioned in an optimal site about halfway from base to apex, there was diaphragmatic stimulation below 5 volts and the pacing threshold was above 5 volts.  Multiple locations on the lateral vein were tried, but the diaphragm was always stimulated and the pacing threshold was always greater than 5 volts making this  lateral vein unsuitable for LV lead placement.  After this was carried out, attention was then turned to placement of the LV lead into the posterior vein.  The posterior vein was recannulated and the LV lead was advanced into it.  Unfortunately, capture was again not satisfactory with diaphragmatic stimulation below 5 volts and the threshold above 5 volts at multiple locations along the posterior vein and the left ventricle. At this point, it was noticed that there was an anastomosis between the posterior vein and the lateral vein and the 0.014 guidewire was advanced into this anastomotic area and advanced from the posterior vein through the anastomotic channels into the lateral vein.  The LV lead was advanced over the guidewire into the distal portion on the lateral wall of the lateral vein and here the threshold was less than 3 volts at 0.8 milliseconds.  Diaphragmatic stimulation was not present.  This appeared to be a satisfactory location and the guiding catheter was removed from the LV lead in the usual manner and the leads were secured to subpectoralis fascia with silk suture.  Addition sewing sleeve was secured with silk suture.  At this point, the guidewires were all removed and electrocautery was utilized to make a subcutaneous pocket. The Medtronic BiV ICD serial number [id number] was connected to the atrial RV and LV leads and placed back in the subcutaneous pocket.  The pocket was irrigated with bacitracin irrigation.  Having accomplished all of this, the patient was more deeply sedated and defibrillation threshold testing was carried out.  After the patient more deeply sedated with fentanyl and Versed, VF was induced with T-wave shock.  A 15-joule shock was subsequently delivered which terminated VF and restored sinus rhythm.  At this point, additional pacing was carried out which demonstrated that in fact the LV threshold had increased to 5 volts at 1 millisecond.  Because  there were no other real options for LV pacing, it was deemed most appropriately to leave the lead in this location as it did not appreciably removed and the skin was sewn with up with 2-0 and 3-0 Vicryl.  Benzoin and Steri- Strips were painted on the skin.  A pressure dressing was applied and the patient was returned to his room in satisfactory condition.  COMPLICATIONS:  There were no immediate procedural complications.  RESULTS:  This demonstrates successful insertion of a BiV ICD in a patient with ventricular tachycardia, ischemic cardiomyopathy, left bundle-branch block, and congestive heart failure as previously described.  It is noted that the LV pacing threshold despite initial satisfactory results was elevated at the time that the procedure was terminated.     Doylene Canning. Ladona Ridgel, MD     GWT/MEDQ  D:  07/05/2010  T:  07/06/2010  Job:  [id number]  cc:   Thurmon Fair, MD Landry Corporal, MD Thereasa Solo Little, M.D.  Electronically Signed by Lewayne Bunting MD on 08/15/2010 05:06:26 PM

## 2010-08-26 ENCOUNTER — Telehealth: Payer: Self-pay | Admitting: Internal Medicine

## 2010-08-28 NOTE — Letter (Signed)
Summary: [facility] Discharge Summary  [facility] Discharge Summary   Imported By: Earl Many 08/19/2010 09:20:17  _____________________________________________________________________  External Attachment:    Type:   Image     Comment:   External Document

## 2010-08-28 NOTE — Cardiovascular Report (Signed)
Summary: [facility] Cardiac Cath  [facility] Cardiac Cath   Imported By: Earl Many 08/15/2010 15:37:34  _____________________________________________________________________  External Attachment:    Type:   Image     Comment:   External Document

## 2010-09-03 NOTE — Progress Notes (Signed)
Summary: question re transmitting  Phone Note Call from Patient Call back at Home Phone [phone]   Caller: Spouse Reason for Call: Talk to Nurse Summary of Call: pt wife states pt does not know how to transmitt over the phone. pt has some question. Initial call taken by: Roe Coombs,  August 26, 2010 2:30 PM  Follow-up for Phone Call        spoke w/pt ---pt received a letter to make an appt.  Advised pt to call tomorrow morning and make an appt w/Dr Ladona Ridgel. Vella Kohler  August 26, 2010 5:55 PM

## 2010-09-16 LAB — CBC
HCT: 30.9 % — ABNORMAL LOW (ref 39.0–52.0)
HCT: 32.6 % — ABNORMAL LOW (ref 39.0–52.0)
HCT: 37 % — ABNORMAL LOW (ref 39.0–52.0)
Hemoglobin: 9.8 g/dL — ABNORMAL LOW (ref 13.0–17.0)
Hemoglobin: 9.8 g/dL — ABNORMAL LOW (ref 13.0–17.0)
MCH: 25.8 pg — ABNORMAL LOW (ref 26.0–34.0)
MCH: 26.6 pg (ref 26.0–34.0)
MCHC: 30.7 g/dL (ref 30.0–36.0)
MCHC: 31.7 g/dL (ref 30.0–36.0)
MCV: 82.5 fL (ref 78.0–100.0)
Platelets: 162 10*3/uL (ref 150–400)
Platelets: 172 10*3/uL (ref 150–400)
Platelets: 219 10*3/uL (ref 150–400)
RBC: 3.77 MIL/uL — ABNORMAL LOW (ref 4.22–5.81)
RDW: 15.7 % — ABNORMAL HIGH (ref 11.5–15.5)
RDW: 16.1 % — ABNORMAL HIGH (ref 11.5–15.5)
RDW: 16.2 % — ABNORMAL HIGH (ref 11.5–15.5)
WBC: 10.6 10*3/uL — ABNORMAL HIGH (ref 4.0–10.5)
WBC: 8.1 10*3/uL (ref 4.0–10.5)
WBC: 9.6 10*3/uL (ref 4.0–10.5)

## 2010-09-16 LAB — BASIC METABOLIC PANEL
BUN: 13 mg/dL (ref 6–23)
BUN: 5 mg/dL — ABNORMAL LOW (ref 6–23)
CO2: 25 mEq/L (ref 19–32)
CO2: 28 mEq/L (ref 19–32)
CO2: 28 mEq/L (ref 19–32)
Calcium: 8.1 mg/dL — ABNORMAL LOW (ref 8.4–10.5)
Calcium: 8.2 mg/dL — ABNORMAL LOW (ref 8.4–10.5)
Calcium: 8.2 mg/dL — ABNORMAL LOW (ref 8.4–10.5)
Calcium: 8.5 mg/dL (ref 8.4–10.5)
Chloride: 108 mEq/L (ref 96–112)
Creatinine, Ser: 0.76 mg/dL (ref 0.4–1.5)
Creatinine, Ser: 0.78 mg/dL (ref 0.4–1.5)
Creatinine, Ser: 0.87 mg/dL (ref 0.4–1.5)
Creatinine, Ser: 0.99 mg/dL (ref 0.4–1.5)
GFR calc Af Amer: >60 mL/min (ref >60)
GFR calc Af Amer: >60 mL/min (ref >60)
GFR calc non Af Amer: >60 mL/min (ref >60)
GFR calc non Af Amer: >60 mL/min (ref >60)
GFR calc non Af Amer: >60 mL/min (ref >60)
GFR calc non Af Amer: >60 mL/min (ref >60)
Glucose, Bld: 115 mg/dL — ABNORMAL HIGH (ref 70–99)
Glucose, Bld: 121 mg/dL — ABNORMAL HIGH (ref 70–99)
Glucose, Bld: 169 mg/dL — ABNORMAL HIGH (ref 70–99)
Potassium: 3.3 mEq/L — ABNORMAL LOW (ref 3.5–5.1)
Potassium: 3.9 mEq/L (ref 3.5–5.1)
Sodium: 133 mEq/L — ABNORMAL LOW (ref 135–145)
Sodium: 137 mEq/L (ref 135–145)
Sodium: 138 mEq/L (ref 135–145)

## 2010-09-16 LAB — PROTIME-INR
INR: 1.23 (ref 0.00–1.49)
INR: 1.28 (ref 0.00–1.49)
Prothrombin Time: 15.7 seconds — ABNORMAL HIGH (ref 11.6–15.2)

## 2010-09-16 LAB — LIPID PANEL
HDL: 28 mg/dL — ABNORMAL LOW (ref >39)
LDL Cholesterol: 113 mg/dL — ABNORMAL HIGH (ref 0–99)
Triglycerides: 115 mg/dL (ref <150)

## 2010-09-16 LAB — COMPREHENSIVE METABOLIC PANEL
Albumin: 3.6 g/dL (ref 3.5–5.2)
BUN: 9 mg/dL (ref 6–23)
Calcium: 8.5 mg/dL (ref 8.4–10.5)
Creatinine, Ser: 0.88 mg/dL (ref 0.4–1.5)
Potassium: 3.7 mEq/L (ref 3.5–5.1)
Total Protein: 6.1 g/dL (ref 6.0–8.3)

## 2010-09-16 LAB — CARDIAC PANEL(CRET KIN+CKTOT+MB+TROPI)
CK, MB: 60.2 ng/mL (ref 0.3–4.0)
Relative Index: INVALID (ref 0.0–2.5)
Total CK: 80 U/L (ref 7–232)
Troponin I: 12.19 ng/mL (ref 0.00–0.06)

## 2010-09-16 LAB — HEPATIC FUNCTION PANEL
AST: 53 U/L — ABNORMAL HIGH (ref 0–37)
Albumin: 3.3 g/dL — ABNORMAL LOW (ref 3.5–5.2)
Alkaline Phosphatase: 60 U/L (ref 39–117)
Total Bilirubin: 0.8 mg/dL (ref 0.3–1.2)

## 2010-09-16 LAB — BRAIN NATRIURETIC PEPTIDE: Pro B Natriuretic peptide (BNP): 190 pg/mL — ABNORMAL HIGH (ref 0.0–100.0)

## 2010-09-16 LAB — DIFFERENTIAL
Lymphocytes Relative: 12 % (ref 12–46)
Monocytes Absolute: 0.5 10*3/uL (ref 0.1–1.0)
Monocytes Relative: 6 % (ref 3–12)
Neutro Abs: 7.8 10*3/uL — ABNORMAL HIGH (ref 1.7–7.7)

## 2010-09-16 LAB — MAGNESIUM: Magnesium: 2.2 mg/dL (ref 1.5–2.5)

## 2010-09-16 LAB — POCT CARDIAC MARKERS
CKMB, poc: 1.7 ng/mL (ref 1.0–8.0)
Myoglobin, poc: 171 ng/mL (ref 12–200)

## 2010-09-16 LAB — CK TOTAL AND CKMB (NOT AT ARMC)
CK, MB: 2.9 ng/mL (ref 0.3–4.0)
Total CK: 65 U/L (ref 7–232)

## 2010-09-16 LAB — TSH: TSH: 2.73 u[IU]/mL (ref 0.350–4.500)

## 2010-09-27 ENCOUNTER — Telehealth: Payer: Self-pay | Admitting: Internal Medicine

## 2010-09-27 ENCOUNTER — Encounter: Payer: Medicare Other | Admitting: *Deleted

## 2010-09-30 ENCOUNTER — Emergency Department (HOSPITAL_COMMUNITY): Payer: Medicare Other

## 2010-09-30 ENCOUNTER — Inpatient Hospital Stay (HOSPITAL_COMMUNITY)
Admission: EM | Admit: 2010-09-30 | Discharge: 2010-10-03 | DRG: 251 | Disposition: A | Discharge status: Home / Self Care | Payer: Medicare Other | Attending: Cardiology | Admitting: Cardiology

## 2010-09-30 DIAGNOSIS — Z7982 Long term (current) use of aspirin: Secondary | ICD-10-CM

## 2010-09-30 DIAGNOSIS — T82897A Other specified complication of cardiac prosthetic devices, implants and grafts, initial encounter: Principal | ICD-10-CM | POA: Diagnosis present

## 2010-09-30 DIAGNOSIS — I10 Essential (primary) hypertension: Secondary | ICD-10-CM | POA: Diagnosis present

## 2010-09-30 DIAGNOSIS — Z951 Presence of aortocoronary bypass graft: Secondary | ICD-10-CM

## 2010-09-30 DIAGNOSIS — I2589 Other forms of chronic ischemic heart disease: Secondary | ICD-10-CM | POA: Diagnosis present

## 2010-09-30 DIAGNOSIS — I4891 Unspecified atrial fibrillation: Secondary | ICD-10-CM | POA: Diagnosis present

## 2010-09-30 DIAGNOSIS — Z9861 Coronary angioplasty status: Secondary | ICD-10-CM

## 2010-09-30 DIAGNOSIS — I2510 Atherosclerotic heart disease of native coronary artery without angina pectoris: Secondary | ICD-10-CM | POA: Diagnosis present

## 2010-09-30 DIAGNOSIS — Y849 Medical procedure, unspecified as the cause of abnormal reaction of the patient, or of later complication, without mention of misadventure at the time of the procedure: Secondary | ICD-10-CM | POA: Diagnosis present

## 2010-09-30 DIAGNOSIS — E785 Hyperlipidemia, unspecified: Secondary | ICD-10-CM | POA: Diagnosis present

## 2010-09-30 DIAGNOSIS — Z95810 Presence of automatic (implantable) cardiac defibrillator: Secondary | ICD-10-CM

## 2010-09-30 DIAGNOSIS — I200 Unstable angina: Secondary | ICD-10-CM | POA: Diagnosis present

## 2010-09-30 DIAGNOSIS — G609 Hereditary and idiopathic neuropathy, unspecified: Secondary | ICD-10-CM | POA: Diagnosis present

## 2010-09-30 DIAGNOSIS — I252 Old myocardial infarction: Secondary | ICD-10-CM

## 2010-09-30 DIAGNOSIS — Z7902 Long term (current) use of antithrombotics/antiplatelets: Secondary | ICD-10-CM

## 2010-09-30 LAB — CBC
MCH: 25.2 pg — ABNORMAL LOW (ref 26.0–34.0)
MCV: 81.3 fL (ref 78.0–100.0)
Platelets: 185 10*3/uL (ref 150–400)
RBC: 4.01 MIL/uL — ABNORMAL LOW (ref 4.22–5.81)
RDW: 16.1 % — ABNORMAL HIGH (ref 11.5–15.5)

## 2010-09-30 LAB — URINALYSIS, ROUTINE W REFLEX MICROSCOPIC
Glucose, UA: NEGATIVE mg/dL
Ketones, ur: NEGATIVE mg/dL
Nitrite: NEGATIVE
Specific Gravity, Urine: 1.017 (ref 1.005–1.030)
pH: 6 (ref 5.0–8.0)

## 2010-09-30 LAB — POCT CARDIAC MARKERS
CKMB, poc: <1 ng/mL — ABNORMAL LOW (ref 1.0–8.0)
Troponin i, poc: <0.05 ng/mL (ref 0.00–0.09)

## 2010-09-30 LAB — CK TOTAL AND CKMB (NOT AT ARMC)
CK, MB: 2.3 ng/mL (ref 0.3–4.0)
Total CK: 67 U/L (ref 7–232)

## 2010-09-30 LAB — COMPREHENSIVE METABOLIC PANEL
AST: 17 U/L (ref 0–37)
Albumin: 3.7 g/dL (ref 3.5–5.2)
Chloride: 105 mEq/L (ref 96–112)
Creatinine, Ser: 0.81 mg/dL (ref 0.4–1.5)
GFR calc Af Amer: >60 mL/min (ref >60)
Total Bilirubin: 1 mg/dL (ref 0.3–1.2)
Total Protein: 6 g/dL (ref 6.0–8.3)

## 2010-09-30 LAB — DIFFERENTIAL
Eosinophils Absolute: 0.1 10*3/uL (ref 0.0–0.7)
Eosinophils Relative: 2 % (ref 0–5)
Lymphs Abs: 0.8 10*3/uL (ref 0.7–4.0)
Monocytes Relative: 9 % (ref 3–12)

## 2010-09-30 LAB — CARDIAC PANEL(CRET KIN+CKTOT+MB+TROPI)
CK, MB: 2.8 ng/mL (ref 0.3–4.0)
Total CK: 167 U/L (ref 7–232)
Troponin I: 0.02 ng/mL (ref 0.00–0.06)

## 2010-09-30 LAB — MAGNESIUM: Magnesium: 2.1 mg/dL (ref 1.5–2.5)

## 2010-09-30 MED ORDER — IOHEXOL 300 MG/ML  SOLN
100.0000 mL | Freq: Once | INTRAMUSCULAR | Status: AC | PRN
  Administered 2010-09-30: 100 mL via INTRAVENOUS

## 2010-10-01 ENCOUNTER — Encounter: Payer: Self-pay | Admitting: Internal Medicine

## 2010-10-01 LAB — CARDIAC PANEL(CRET KIN+CKTOT+MB+TROPI)
Relative Index: 1.2 (ref 0.0–2.5)
Troponin I: 0.02 ng/mL (ref 0.00–0.06)

## 2010-10-01 LAB — BASIC METABOLIC PANEL
BUN: 10 mg/dL (ref 6–23)
Calcium: 8.7 mg/dL (ref 8.4–10.5)
Creatinine, Ser: 0.84 mg/dL (ref 0.4–1.5)
GFR calc non Af Amer: >60 mL/min (ref >60)
Glucose, Bld: 104 mg/dL — ABNORMAL HIGH (ref 70–99)
Potassium: 3.4 mEq/L — ABNORMAL LOW (ref 3.5–5.1)

## 2010-10-01 LAB — CBC
MCH: 26 pg (ref 26.0–34.0)
MCHC: 32 g/dL (ref 30.0–36.0)
MCV: 81.3 fL (ref 78.0–100.0)
Platelets: 161 10*3/uL (ref 150–400)
RBC: 4 MIL/uL — ABNORMAL LOW (ref 4.22–5.81)
RDW: 16.1 % — ABNORMAL HIGH (ref 11.5–15.5)

## 2010-10-01 LAB — HEPARIN LEVEL (UNFRACTIONATED): Heparin Unfractionated: 0.46 IU/mL (ref 0.30–0.70)

## 2010-10-02 LAB — BASIC METABOLIC PANEL
BUN: 13 mg/dL (ref 6–23)
CO2: 29 mEq/L (ref 19–32)
Chloride: 106 mEq/L (ref 96–112)
Creatinine, Ser: 0.91 mg/dL (ref 0.4–1.5)
Glucose, Bld: 105 mg/dL — ABNORMAL HIGH (ref 70–99)
Potassium: 3.9 mEq/L (ref 3.5–5.1)

## 2010-10-02 LAB — CBC
HCT: 30.5 % — ABNORMAL LOW (ref 39.0–52.0)
MCH: 25.9 pg — ABNORMAL LOW (ref 26.0–34.0)
MCV: 83.1 fL (ref 78.0–100.0)
Platelets: 146 10*3/uL — ABNORMAL LOW (ref 150–400)
RDW: 16.3 % — ABNORMAL HIGH (ref 11.5–15.5)
WBC: 5.8 10*3/uL (ref 4.0–10.5)

## 2010-10-03 LAB — CBC
HCT: 28.3 % — ABNORMAL LOW (ref 39.0–52.0)
MCH: 27 pg (ref 26.0–34.0)
MCV: 82.3 fL (ref 78.0–100.0)
RDW: 16.3 % — ABNORMAL HIGH (ref 11.5–15.5)
WBC: 5.8 10*3/uL (ref 4.0–10.5)

## 2010-10-03 LAB — BASIC METABOLIC PANEL
BUN: 12 mg/dL (ref 6–23)
Chloride: 107 mEq/L (ref 96–112)
Creatinine, Ser: 0.75 mg/dL (ref 0.4–1.5)
Glucose, Bld: 104 mg/dL — ABNORMAL HIGH (ref 70–99)
Potassium: 3.5 mEq/L (ref 3.5–5.1)

## 2010-10-06 NOTE — H&P (Signed)
NAME:  Dakota Liu, Dakota Liu NO.:  [id number]  MEDICAL RECORD NO.:  [id number]           PATIENT TYPE:  E  LOCATION:  [facility]                         FACILITY:  [facility]  PHYSICIAN:  Thereasa Solo. Statia Burdick, M.D. DATE OF BIRTH:  02-18-1928  DATE OF ADMISSION:  09/30/2010 DATE OF DISCHARGE:                             HISTORY & PHYSICAL   CHIEF COMPLAINT:  Chest pain and shortness of breath.  HISTORY OF PRESENT ILLNESS:  The patient is an 77 year old male followed by Dr. Clarene Duke with a history of coronary artery disease.  He has had remote MI in 1986.  He had bypass grafting in 1988.  He was recently admitted, December 2011, when he presented with weakness and ventricular tachycardia.  Catheterization showed patent grafts with a patent LIMA to the LAD, patent SVG to the diagonal, patent SVG to the OM1, OM2 and patent SVG to the RCA with distal RCA disease.  He was treated with an elective integrity stent to the distal RCA via the graft.  His EF by echocardiogram during that admission was 30-35%.  He was seen by the EP service.  They felt he had both atrial fibrillation and ventricular tachycardia with a baseline bundle left bundle-branch block.  A Bi-V ICD was implanted, July 06, 2010.  The patient has done pretty well since discharge.  He last saw Dr. Clarene Duke, August 15, 2010.  He has been without chest pain until 1 o'clock this morning when he was awakened with pain in his chest.  He describes this as high up in his chest.  It went to his neck and back and down both arms.  He also had orthopnea, he could not lay down without being short of breath.  His symptoms waxed and waned all night until about 5:00 a.m..  He took an aspirin and then felt better.  He called the office this morning and was sent to the emergency room by Dr. Clarene Duke.  CT scan of his chest and abdomen were ordered to rule out dissection.  He is currently pain-free.  He did not take any nitroglycerin at home.   He is admitted now for further evaluation.  I spoke with Dr. Clarene Duke on the phone and he would like to get the CT scans done and then if these are stable, he can be anticoagulated and set up for diagnostic catheterization tomorrow pending his renal function.  PAST MEDICAL HISTORY:  Remarkable for lymphoma, this was treated 5 years ago.  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  He has treated hypertension and treated dyslipidemia.  He has vascular disease and had an abdominal aortic aneurysm repair and graft November 2010.  In the past, he has had renal insufficiency but his creatinine today is stable at 0.8.  He has PAF as documented during his recent admission in December, he is not on Coumadin at this time and has not had recurrent atrial fibrillation as far as we know, he was discharged in January 2012 in sinus rhythm.  MEDICATIONS:  His medications as best we know are: 1. Zocor 40 mg a day. 2. Potassium 20 mEq a day. 3.  Diovan HCTZ, unknown dose. 4. Metoprolol 25 mg once a day. 5. Aspirin 325 mg a day. 6. Omeprazole 20 mg a day. 7. Plavix 75 mg a day.  ALLERGIES: 1. He has had ACE INHIBITOR intolerance with a cough. 2. He has had myalgias from ZOCOR in the past.  SOCIAL HISTORY:  He is married.  He has 3 children.  He is a remote smoker.  He is here with his wife.  FAMILY HISTORY:  Unremarkable for coronary artery disease.  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  He has bilateral foot braces and uses a cane to ambulate.  PHYSICAL EXAMINATION:  GENERAL:  He is an elderly male, appears somewhat chronically ill but in no acute distress. VITAL SIGNS:  Blood pressure 180/84, pulse 72, temperature 98, O2 percent sat is 97 on room air. HEENT: Normocephalic, atraumatic.  Extraocular movements are intact. Sclerae are nonicteric. NECK:  Without JVD or bruit. CHEST:  Clear to auscultation and percussion. CARDIAC:  Regular rate and  rhythm without obvious murmur, rub, or gallop.  Normal S1, S2. ABDOMEN:  Midline surgical scar.  Nontender.  No hepatosplenomegaly.  No bruits.  No was widened pulsation. EXTREMITIES:  Trace edema with diminished distal pulses in both lower extremities.  There are no femoral bruits noted.  Upper extremities reveal 3+/4 radial pulses that are equal in both upper extremities. NEURO:  Grossly intact.  He is awake, alert, oriented, and cooperative. Moves all extremities without obvious deficit. SKIN:  Cool and dry.  LABORATORY DATA:  EKG shows sinus rhythm with interventricular conduction delay.  White count 6.5, hemoglobin 10.1, hematocrit 32.6, platelets 185.  Sodium 136, potassium 3.8, BUN 12, creatinine 0.81. SGOT and SGPT are normal.  INR is 1.18, troponin is negative x1.  IMPRESSION: 1. Chest pain consistent with unstable angina, rule out aortic     dissection as well. 2. Known coronary artery disease with coronary artery bypass grafting     in 1988 with recent distal right coronary artery stent via patent     saphenous vein graft, July 05, 2010. 3. Known ischemic cardiomyopathy with an ejection fraction of 30-35%     by echocardiogram, January 2012. 4. Ventricular tachycardia in December 2012 on presentation, status     post biventricular implantable cardioverter defibrillator July 06, 2010. 5. History of paroxysmal atrial fibrillation in December 2011,     discharged in sinus rhythm. 6. Treated hypertension. 7. Treated dyslipidemia. 8. Known vascular disease with a history of an abdominal aortic     aneurysm repair and grafting November 2010. 9. Past history of lymphoma treated 5 years ago with residual     neuropathy.  PLAN:  The patient is admitted through the emergency room.  We will start him on nitroglycerin paste.  Continue his home medications.  He will get a CT of his abdomen and chest, rule out dissection.  If these are negative, we will start him on  anticoagulants and plan for diagnostic catheterization tomorrow pending his renal function.     Dakota Liu, P.A.   ______________________________ Thereasa Solo. Darvin Dials, M.D.    [name]  D:  09/30/2010  T:  09/30/2010  Job:  [id number]  Electronically Signed by Corine Shelter P.A. on 10/04/2010 02:40:31 PM Electronically Signed by Julieanne Manson M.D. on 10/06/2010 10:27:30 AM

## 2010-10-09 LAB — GLUCOSE, CAPILLARY
Glucose-Capillary: 102 mg/dL — ABNORMAL HIGH (ref 70–99)
Glucose-Capillary: 102 mg/dL — ABNORMAL HIGH (ref 70–99)
Glucose-Capillary: 103 mg/dL — ABNORMAL HIGH (ref 70–99)
Glucose-Capillary: 107 mg/dL — ABNORMAL HIGH (ref 70–99)
Glucose-Capillary: 110 mg/dL — ABNORMAL HIGH (ref 70–99)
Glucose-Capillary: 117 mg/dL — ABNORMAL HIGH (ref 70–99)
Glucose-Capillary: 121 mg/dL — ABNORMAL HIGH (ref 70–99)
Glucose-Capillary: 121 mg/dL — ABNORMAL HIGH (ref 70–99)
Glucose-Capillary: 94 mg/dL (ref 70–99)
Glucose-Capillary: 118 mg/dL — ABNORMAL HIGH (ref 70–99)
Glucose-Capillary: 126 mg/dL — ABNORMAL HIGH (ref 70–99)
Glucose-Capillary: 129 mg/dL — ABNORMAL HIGH (ref 70–99)
Glucose-Capillary: 132 mg/dL — ABNORMAL HIGH (ref 70–99)
Glucose-Capillary: 133 mg/dL — ABNORMAL HIGH (ref 70–99)
Glucose-Capillary: 137 mg/dL — ABNORMAL HIGH (ref 70–99)
Glucose-Capillary: 138 mg/dL — ABNORMAL HIGH (ref 70–99)
Glucose-Capillary: 139 mg/dL — ABNORMAL HIGH (ref 70–99)
Glucose-Capillary: 140 mg/dL — ABNORMAL HIGH (ref 70–99)
Glucose-Capillary: 143 mg/dL — ABNORMAL HIGH (ref 70–99)
Glucose-Capillary: 148 mg/dL — ABNORMAL HIGH (ref 70–99)
Glucose-Capillary: 153 mg/dL — ABNORMAL HIGH (ref 70–99)

## 2010-10-09 LAB — HEMOGLOBIN A1C: Hgb A1c MFr Bld: 5.9 % (ref 4.6–6.1)

## 2010-10-09 LAB — BASIC METABOLIC PANEL
BUN: 12 mg/dL (ref 6–23)
BUN: 8 mg/dL (ref 6–23)
BUN: 8 mg/dL (ref 6–23)
BUN: 8 mg/dL (ref 6–23)
CO2: 28 mEq/L (ref 19–32)
CO2: 31 mEq/L (ref 19–32)
CO2: 32 mEq/L (ref 19–32)
CO2: 27 mEq/L (ref 19–32)
CO2: 27 mEq/L (ref 19–32)
Calcium: 7.4 mg/dL — ABNORMAL LOW (ref 8.4–10.5)
Chloride: 96 mEq/L (ref 96–112)
Chloride: 99 mEq/L (ref 96–112)
Chloride: 104 mEq/L (ref 96–112)
Creatinine, Ser: 0.78 mg/dL (ref 0.4–1.5)
Creatinine, Ser: 0.82 mg/dL (ref 0.4–1.5)
Creatinine, Ser: 0.76 mg/dL (ref 0.4–1.5)
Creatinine, Ser: 0.76 mg/dL (ref 0.4–1.5)
GFR calc Af Amer: >60 mL/min (ref >60)
GFR calc non Af Amer: >60 mL/min (ref >60)
GFR calc non Af Amer: >60 mL/min (ref >60)
GFR calc non Af Amer: >60 mL/min (ref >60)
Glucose, Bld: 104 mg/dL — ABNORMAL HIGH (ref 70–99)
Glucose, Bld: 117 mg/dL — ABNORMAL HIGH (ref 70–99)
Glucose, Bld: 94 mg/dL (ref 70–99)
Potassium: 3.1 mEq/L — ABNORMAL LOW (ref 3.5–5.1)
Potassium: 3.6 mEq/L (ref 3.5–5.1)
Potassium: 3.7 mEq/L (ref 3.5–5.1)
Sodium: 134 mEq/L — ABNORMAL LOW (ref 135–145)
Sodium: 136 mEq/L (ref 135–145)

## 2010-10-09 LAB — COMPREHENSIVE METABOLIC PANEL
ALT: 15 U/L (ref 0–53)
AST: 17 U/L (ref 0–37)
Albumin: 3.7 g/dL (ref 3.5–5.2)
Alkaline Phosphatase: 56 U/L (ref 39–117)
BUN: 7 mg/dL (ref 6–23)
BUN: 8 mg/dL (ref 6–23)
BUN: 11 mg/dL (ref 6–23)
CO2: 29 mEq/L (ref 19–32)
Calcium: 7 mg/dL — ABNORMAL LOW (ref 8.4–10.5)
Calcium: 7 mg/dL — ABNORMAL LOW (ref 8.4–10.5)
Chloride: 99 mEq/L (ref 96–112)
Chloride: 102 mEq/L (ref 96–112)
Creatinine, Ser: 0.75 mg/dL (ref 0.4–1.5)
Creatinine, Ser: 0.75 mg/dL (ref 0.4–1.5)
Creatinine, Ser: 0.81 mg/dL (ref 0.4–1.5)
Creatinine, Ser: 0.83 mg/dL (ref 0.4–1.5)
GFR calc Af Amer: >60 mL/min (ref >60)
GFR calc non Af Amer: >60 mL/min (ref >60)
Glucose, Bld: 101 mg/dL — ABNORMAL HIGH (ref 70–99)
Glucose, Bld: 123 mg/dL — ABNORMAL HIGH (ref 70–99)
Glucose, Bld: 153 mg/dL — ABNORMAL HIGH (ref 70–99)
Potassium: 3.3 mEq/L — ABNORMAL LOW (ref 3.5–5.1)
Potassium: 3.6 mEq/L (ref 3.5–5.1)
Sodium: 137 mEq/L (ref 135–145)
Total Bilirubin: 1.6 mg/dL — ABNORMAL HIGH (ref 0.3–1.2)
Total Bilirubin: 1.1 mg/dL (ref 0.3–1.2)
Total Protein: 4.4 g/dL — ABNORMAL LOW (ref 6.0–8.3)
Total Protein: 4.6 g/dL — ABNORMAL LOW (ref 6.0–8.3)
Total Protein: 4.2 g/dL — ABNORMAL LOW (ref 6.0–8.3)
Total Protein: 6 g/dL (ref 6.0–8.3)

## 2010-10-09 LAB — POCT I-STAT 7, (LYTES, BLD GAS, ICA,H+H)
Bicarbonate: 29 mEq/L — ABNORMAL HIGH (ref 20.0–24.0)
Hemoglobin: 8.8 g/dL — ABNORMAL LOW (ref 13.0–17.0)
O2 Saturation: 100 %
Patient temperature: 35.8
Potassium: 3 mEq/L — ABNORMAL LOW (ref 3.5–5.1)
TCO2: 30 mmol/L (ref 0–100)
pCO2 arterial: 37.2 mmHg (ref 35.0–45.0)
pH, Arterial: 7.494 — ABNORMAL HIGH (ref 7.350–7.450)
pO2, Arterial: 457 mmHg — ABNORMAL HIGH (ref 80.0–100.0)

## 2010-10-09 LAB — CBC
HCT: 30.3 % — ABNORMAL LOW (ref 39.0–52.0)
HCT: 23.3 % — ABNORMAL LOW (ref 39.0–52.0)
HCT: 23.8 % — ABNORMAL LOW (ref 39.0–52.0)
Hemoglobin: 9.5 g/dL — ABNORMAL LOW (ref 13.0–17.0)
Hemoglobin: 8.2 g/dL — ABNORMAL LOW (ref 13.0–17.0)
Hemoglobin: 9.1 g/dL — ABNORMAL LOW (ref 13.0–17.0)
MCHC: 33.1 g/dL (ref 30.0–36.0)
MCHC: 33.7 g/dL (ref 30.0–36.0)
MCHC: 33.8 g/dL (ref 30.0–36.0)
MCHC: 32.3 g/dL (ref 30.0–36.0)
MCHC: 32.6 g/dL (ref 30.0–36.0)
MCHC: 33.1 g/dL (ref 30.0–36.0)
MCV: 83.2 fL (ref 78.0–100.0)
MCV: 83.4 fL (ref 78.0–100.0)
MCV: 83.8 fL (ref 78.0–100.0)
MCV: 81.4 fL (ref 78.0–100.0)
MCV: 81.4 fL (ref 78.0–100.0)
MCV: 81.4 fL (ref 78.0–100.0)
Platelets: 158 10*3/uL (ref 150–400)
Platelets: 179 10*3/uL (ref 150–400)
Platelets: 112 10*3/uL — ABNORMAL LOW (ref 150–400)
Platelets: 142 10*3/uL — ABNORMAL LOW (ref 150–400)
Platelets: 192 10*3/uL (ref 150–400)
RBC: 3.39 MIL/uL — ABNORMAL LOW (ref 4.22–5.81)
RBC: 2.93 MIL/uL — ABNORMAL LOW (ref 4.22–5.81)
RBC: 3.4 MIL/uL — ABNORMAL LOW (ref 4.22–5.81)
RDW: 16.7 % — ABNORMAL HIGH (ref 11.5–15.5)
RDW: 16.8 % — ABNORMAL HIGH (ref 11.5–15.5)
RDW: 17 % — ABNORMAL HIGH (ref 11.5–15.5)
RDW: 15.7 % — ABNORMAL HIGH (ref 11.5–15.5)
RDW: 16.2 % — ABNORMAL HIGH (ref 11.5–15.5)
RDW: 16.4 % — ABNORMAL HIGH (ref 11.5–15.5)
WBC: 7.3 10*3/uL (ref 4.0–10.5)
WBC: 8.8 10*3/uL (ref 4.0–10.5)

## 2010-10-09 LAB — CROSSMATCH
ABO/RH(D): O POS
ABO/RH(D): O POS
Antibody Screen: NEGATIVE
Antibody Screen: NEGATIVE

## 2010-10-09 LAB — BLOOD GAS, ARTERIAL
Bicarbonate: 26.5 mEq/L — ABNORMAL HIGH (ref 20.0–24.0)
Drawn by: 206361
O2 Saturation: 98.5 %
Patient temperature: 97.3
Patient temperature: 98.6
TCO2: 27.8 mmol/L (ref 0–100)
pCO2 arterial: 36.3 mmHg (ref 35.0–45.0)
pH, Arterial: 7.471 — ABNORMAL HIGH (ref 7.350–7.450)

## 2010-10-09 LAB — PROTIME-INR
INR: 1.48 (ref 0.00–1.49)
Prothrombin Time: 17.8 seconds — ABNORMAL HIGH (ref 11.6–15.2)

## 2010-10-09 LAB — URINALYSIS, ROUTINE W REFLEX MICROSCOPIC
Bilirubin Urine: NEGATIVE
Nitrite: NEGATIVE
Specific Gravity, Urine: 1.006 (ref 1.005–1.030)
Urobilinogen, UA: 1 mg/dL (ref 0.0–1.0)
pH: 7 (ref 5.0–8.0)

## 2010-10-09 LAB — PHOSPHORUS: Phosphorus: 1.4 mg/dL — ABNORMAL LOW (ref 2.3–4.6)

## 2010-10-09 LAB — APTT
aPTT: 29 seconds (ref 24–37)
aPTT: 32 seconds (ref 24–37)

## 2010-10-09 LAB — CARDIAC PANEL(CRET KIN+CKTOT+MB+TROPI)
Total CK: 59 U/L (ref 7–232)
Troponin I: 0.11 ng/mL — ABNORMAL HIGH (ref 0.00–0.06)

## 2010-10-09 LAB — AMYLASE: Amylase: 113 U/L (ref 27–131)

## 2010-10-09 LAB — MAGNESIUM
Magnesium: 2.2 mg/dL (ref 1.5–2.5)
Magnesium: 2.1 mg/dL (ref 1.5–2.5)

## 2010-10-09 LAB — ABO/RH: ABO/RH(D): O POS

## 2010-10-09 LAB — BRAIN NATRIURETIC PEPTIDE: Pro B Natriuretic peptide (BNP): 1097 pg/mL — ABNORMAL HIGH (ref 0.0–100.0)

## 2010-10-10 ENCOUNTER — Ambulatory Visit (INDEPENDENT_AMBULATORY_CARE_PROVIDER_SITE_OTHER): Payer: Medicare Other | Admitting: Internal Medicine

## 2010-10-10 ENCOUNTER — Encounter: Payer: Self-pay | Admitting: Internal Medicine

## 2010-10-10 VITALS — BP 150/70 | Ht 75.0 in | Wt 170.0 lb

## 2010-10-10 DIAGNOSIS — I2589 Other forms of chronic ischemic heart disease: Secondary | ICD-10-CM

## 2010-10-10 DIAGNOSIS — Z95810 Presence of automatic (implantable) cardiac defibrillator: Secondary | ICD-10-CM | POA: Insufficient documentation

## 2010-10-10 DIAGNOSIS — I4891 Unspecified atrial fibrillation: Secondary | ICD-10-CM

## 2010-10-10 DIAGNOSIS — I509 Heart failure, unspecified: Secondary | ICD-10-CM

## 2010-10-10 DIAGNOSIS — I5022 Chronic systolic (congestive) heart failure: Secondary | ICD-10-CM

## 2010-10-10 DIAGNOSIS — I480 Paroxysmal atrial fibrillation: Secondary | ICD-10-CM | POA: Insufficient documentation

## 2010-10-10 DIAGNOSIS — I5023 Acute on chronic systolic (congestive) heart failure: Secondary | ICD-10-CM | POA: Insufficient documentation

## 2010-10-10 DIAGNOSIS — I255 Ischemic cardiomyopathy: Secondary | ICD-10-CM | POA: Insufficient documentation

## 2010-10-10 DIAGNOSIS — I428 Other cardiomyopathies: Secondary | ICD-10-CM

## 2010-10-10 NOTE — Assessment & Plan Note (Signed)
He appears to be maintaining normal sinus rhythm. He will continue his current medications.

## 2010-10-10 NOTE — Assessment & Plan Note (Signed)
His device is working normally. We'll recheck in several months. 

## 2010-10-10 NOTE — Assessment & Plan Note (Signed)
His symptoms remain class II. His fluid index is improved. I discussed the importance of a low sodium diet. He will continue his current medications.

## 2010-10-10 NOTE — Patient Instructions (Signed)
Your physician wants you to follow-up in: Dec 2012 You will receive a reminder letter in the mail two months in advance. If you don't receive a letter, please call our office to schedule the follow-up appointment.

## 2010-10-10 NOTE — Progress Notes (Signed)
HPI Dakota Liu returns today for followup. He is a pleasant 77 year old male with long-standing ischemic cardiomyopathy. He has congestive heart failure and atrial fibrillation and left bundle branch block. The patient underwent insertion of a defibrillator several months ago. A biventricular device was placed at that time. He was recently hospitalized with chest pain and was found to have an occluded stent and was treated by Dr. Gery Pray. He does admit to sodium indiscretion. He is trying to improve on this. He denies peripheral edema. He denies medical noncompliance. He has had no defibrillator shocks. No Known Allergies   Current Outpatient Prescriptions  Medication Sig Dispense Refill  . acetaminophen (TYLENOL) 325 MG tablet Take 650 mg by mouth every 6 (six) hours as needed.        Marland Kitchen aspirin 325 MG tablet Take 325 mg by mouth daily.        . clopidogrel (PLAVIX) 75 MG tablet Take 75 mg by mouth daily.        . hydrochlorothiazide 25 MG tablet Take 12.5 mg by mouth daily.        . metoprolol succinate (TOPROL-XL) 25 MG 24 hr tablet Take 25 mg by mouth daily.        . nitroGLYCERIN (NITROSTAT) 0.4 MG SL tablet Place 0.4 mg under the tongue every 5 (five) minutes as needed.        Marland Kitchen omeprazole (PRILOSEC) 20 MG capsule Take 20 mg by mouth daily.        . potassium chloride SA (K-DUR,KLOR-CON) 20 MEQ tablet Take 20 mEq by mouth daily.        . simvastatin (ZOCOR) 40 MG tablet Take 40 mg by mouth at bedtime.        . valsartan (DIOVAN) 320 MG tablet Take 320 mg by mouth daily.           Past Medical History  Diagnosis Date  . HTN (hypertension)   . Dyslipidemia   . Lymphoma     with metal radiation about 2006  . Epididymitis   . PAF (paroxysmal atrial fibrillation)     ROS:   All systems reviewed and negative except as noted in the HPI.   Past Surgical History  Procedure Date  . Tonsillectomy   . Orchiectomy   . Coronary artery bypass graft   . Abdominal aortic aneurysm repair     . Cardiac defibrillator placement      No family history on file.   History   Social History  . Marital Status: Married    Spouse Name: N/A    Number of Children: N/A  . Years of Education: N/A   Occupational History  . Not on file.   Social History Main Topics  . Smoking status: Former Games developer  . Smokeless tobacco: Not on file  . Alcohol Use: Not on file  . Drug Use: Not on file  . Sexually Active: Not on file   Other Topics Concern  . Not on file   Social History Narrative  . No narrative on file     BP 150/70  Ht 6\' 3"  (1.905 m)  Wt 170 lb (77.111 kg)  BMI 21.25 kg/m2  Physical Exam:  Well appearing NAD HEENT: Unremarkable Neck:  No JVD, no thyromegally Lymphatics:  No adenopathy Back:  No CVA tenderness Lungs:  Clear.well-healed ICD incision. HEART:  Regular rate rhythm, no murmurs, no rubs, no clicks Abd:  Flat, positive bowel sounds, no organomegally, no rebound, no guarding Ext:  2 plus  pulses, no edema, no cyanosis, no clubbing Skin:  No rashes no nodules Neuro:  CN II through XII intact, motor grossly intact  DEVICE  Normal device function.  See PaceArt for details.   Assess/Plan:

## 2010-10-15 ENCOUNTER — Other Ambulatory Visit (HOSPITAL_COMMUNITY): Payer: Self-pay | Admitting: Oncology

## 2010-10-15 ENCOUNTER — Encounter (HOSPITAL_BASED_OUTPATIENT_CLINIC_OR_DEPARTMENT_OTHER): Payer: Medicare Other | Admitting: Oncology

## 2010-10-15 DIAGNOSIS — Z950 Presence of cardiac pacemaker: Secondary | ICD-10-CM

## 2010-10-15 DIAGNOSIS — C8581 Other specified types of non-Hodgkin lymphoma, lymph nodes of head, face, and neck: Secondary | ICD-10-CM

## 2010-10-15 DIAGNOSIS — C8291 Follicular lymphoma, unspecified, lymph nodes of head, face, and neck: Secondary | ICD-10-CM

## 2010-10-15 DIAGNOSIS — Z09 Encounter for follow-up examination after completed treatment for conditions other than malignant neoplasm: Secondary | ICD-10-CM

## 2010-10-15 LAB — CBC WITH DIFFERENTIAL/PLATELET
BASO%: 0.2 % (ref 0.0–2.0)
HCT: 29.9 % — ABNORMAL LOW (ref 38.4–49.9)
LYMPH%: 11.3 % — ABNORMAL LOW (ref 14.0–49.0)
MCH: 26.6 pg — ABNORMAL LOW (ref 27.2–33.4)
MCHC: 33.2 g/dL (ref 32.0–36.0)
MCV: 80.1 fL (ref 79.3–98.0)
MONO#: 0.6 10*3/uL (ref 0.1–0.9)
MONO%: 6.6 % (ref 0.0–14.0)
NEUT%: 80.2 % — ABNORMAL HIGH (ref 39.0–75.0)
Platelets: 242 10*3/uL (ref 140–400)
RBC: 3.74 10*6/uL — ABNORMAL LOW (ref 4.20–5.82)
WBC: 8.6 10*3/uL (ref 4.0–10.3)

## 2010-10-15 LAB — COMPREHENSIVE METABOLIC PANEL
AST: 12 U/L (ref 0–37)
BUN: 10 mg/dL (ref 6–23)
Calcium: 8.8 mg/dL (ref 8.4–10.5)
Chloride: 106 mEq/L (ref 96–112)
Creatinine, Ser: 0.82 mg/dL (ref 0.40–1.50)
Total Bilirubin: 0.7 mg/dL (ref 0.3–1.2)

## 2010-10-15 NOTE — Procedures (Signed)
  NAME:  Dakota Liu, Dakota Liu NO.:  [id number]  MEDICAL RECORD NO.:  [id number]           PATIENT TYPE:  I  LOCATION:  2919                         FACILITY:  [facility]  PHYSICIAN:  Nanetta Batty, M.D.   DATE OF BIRTH:  [birth year]  DATE OF PROCEDURE: DATE OF DISCHARGE:                           CARDIAC CATHETERIZATION   PROCEDURE:  Cutting balloon atherectomy.  [name] is an 77 year old gentleman with history of ischemic heart disease and prior bypass surgery in 1988.  He has had a remote MI prior to that.  He does have severe LV dysfunction and EF in the 30% range. He has had the ICD placed.  Dr. Tresa Endo placed Integrity bare-metal stent in the distal RCA through this vein graft approximately 3 months ago. He was admitted 2 days ago with unstable angina and was cathed by Dr. Ritta Slot yesterday revealing in-stent restenosis within the distal RCA stent.  The remainder of his anatomy is unchanged.  He presents now for a staged re-intervention.  DESCRIPTION OF PROCEDURE:  The patient was brought to the Second Floor [facility] in the postabsorptive state.  He was premedicated with p.o. Valium.  His right groin was prepped and shaved in the usual sterile fashion.  Xylocaine 1% was used for local anesthesia.  A 6-French sheath was inserted into the right femoral artery using standard Seldinger technique.  The patient was on aspirin and Plavix, and received Angiomax bolus with an ACT of 393.  Visipaque dye was used for the entirety of the case.  Total of 60 mL of contrast was given to the patient.  Using a 6-French RCB guide along with 0.14 x 190 Asahi soft wire and a 2.5 x 15 angioscope "cutting balloon" was used to perform cutting balloon atherectomy on the area of in-stent restenosis.  This was then carefully placed angiographically and inflated to 12 atmospheres resulting in reduction of a 90% diffuse in-stent restenosis to 0% residual with TIMI  3 flow.  The patient tolerated the procedure well. He received 200 mcg of intracoronary nitroglycerin.  IMPRESSION:  Successful cutting balloon atherectomy of the distal right coronary artery for in-stent restenosis in the setting of acute coronary artery syndrome with an excellent angiographic result.  The guidewire and catheter were removed.  The sheath was securely in place.  The patient left the lab in stable condition.  He will be gently hydrated overnight.  Sheath will be removed in 3 hours.  He will be discharged home in the morning on aspirin and Plavix, and will follow up with Dr. Julieanne Manson.  He left the lab in stable condition.     Nanetta Batty, M.D.     Cordelia Pen  D:  10/02/2010  T:  10/03/2010  Job:  [id number]  cc:   [facility] Thereasa Solo. Little, M.D.  Electronically Signed by Nanetta Batty M.D. on 10/15/2010 10:45:38 AM

## 2010-10-18 NOTE — Discharge Summary (Signed)
NAME:  Dakota Liu, Dakota Liu NO.:  [id number]  MEDICAL RECORD NO.:  [id number]           PATIENT TYPE:  I  LOCATION:  2919                         FACILITY:  [facility]  PHYSICIAN:  Landry Corporal, MD DATE OF BIRTH:  01/22/1928  DATE OF ADMISSION:  09/30/2010 DATE OF DISCHARGE:  10/03/2010                              DISCHARGE SUMMARY   DISCHARGE DIAGNOSES: 1. Unstable angina status post left heart cath with percutaneous     transluminal coronary angioplasty, cutting balloon atherectomy to     the distal right coronary artery. 2. Status post coronary artery bypass grafting in 1988. 3. History of abdominal aortic aneurysm, this was not seen on CT     angiogram of the abdomen this admission September 30, 2010. 4. Ischemic cardiomyopathy with an ejection fraction of 30% status     post biventricular ICD in December 2011. 5. Paroxysmal atrial fibrillation. 6. History of lymphoma.  HOSPITAL COURSE:  [name] is an 77 year old Caucasian male, history of coronary artery disease status post coronary artery bypass grafting in 1988, remote myocardial infarction in 1986.  He was recently admitted in December 2011 with weakness and ventricular tachycardia.  At that time, he had patent grafts, patent LIMA to LAD, patent SVG to the diagonal, patent SVG to the OM-1, OM-2, and patent SVG to the RCA, and distal RCA disease.  He was treated with an elective Integrity stent to slow RCA via the graft.  Ejection fraction at that time was 30% to 35%. He also received a BiV ICD that was implanted on July 05, 2010.  He presented with chest pain, it was high up in his chest, going to his neck and back, and down both arms.  He also describes orthopnea, could not lie down without being short of breath.  He was admitted.  CT scan of the chest and abdomen and pelvis were completed to rule out dissection.  CT scan of the abdomen and pelvis showed no evidence of aortoiliac aneurysm,  dissection, or acute findings.  Mildly large prostate and diverticulosis.  CT angiogram of the chest showed no evidence of thoracic aortic aneurysm, dissection, or other acute findings.  There was a large hiatal hernia.  Cardiac enzymes were checked and were negative x3.  He did have an elevated BNP of 762. Chest x-ray showed no acute findings and there was a hiatal hernia. There was no pleural effusions or pneumothorax.  The patient was started on nitroglycerin paste as well as home medications.  He was also started on IV heparin.  Given the negative CT scans, he was set up for left heart catheterization.  On the 27th, he did have continued chest pain near AICD site.  Otherwise, he was doing well.  Cardiac cath revealed 80% to 90% diffuse in-stent restenosis in the RCA.  The patient was subsequently scheduled for intervention on October 02, 2010.  The morning of the 28th, he had no chest pain.  He was taken to the Cath Lab where cutting balloon atherectomy was performed to the distal right coronary artery for in-stent restenosis in the setting of acute coronary syndrome  with excellent angiographic results.  Currently, the patient is doing well.  No concerns overnight.  Seen by Dr. Herbie Baltimore, feels he is stable for discharge with followup.  DISCHARGE LABS:  WBC is 5.8, hemoglobin 9.3, hematocrit 28.3, platelets 137.  Sodium 139, potassium 3.5, chloride 107, carbon dioxide 25, glucose 104, BUN 12, creatinine 0.75, calcium 8.1.  Normal urinalysis. Negative MRSA by PCR.  STUDIES/PROCEDURES: 1. CT angiogram of the chest.  Impression:  No evidence of thoracic     aortic aneurysm, dissection, or other acute findings.  There is a     large hiatal hernia. 2. CT angiogram of abdomen and pelvis.  Impression:  Evidence of aorto-     iliac aneurysm, dissection, or acute findings.  Diverticulosis     noted.  No radiographic evidence of diverticulitis.  Mildly     enlarged prostate. 3. Chest x-ray on  September 30, 2010.  No acute findings.  No pleural     effusion or pneumothorax.  Heart size is normal.  Hiatal hernia is     identified. 4. Cardiac catheterization October 01, 2010.  Findings:  AO pressure was     139/73, mean of 99.  Aortic valve was not crossed today.  The LIMA     to the LAD was found to be patent, free of any disease.  SVG to the     D1 was found to be patent, free of any disease.  The SVG to the     sequential OM-1, OM-2, and OM-3 were also patent, free of any     disease.  SVG to the RCA was patent, free of any disease.  The     native LAD and the native circumflex had 100% stenosis in the     proximal portion.  The point of insertion of the SVG to the RCA was     free of any disease.  There was stent was placed in 2011 by Dr.     Tresa Endo in the native RCA just beyond the point of insertion.  This     stent had 80% to 90% diffuse in-stent restenosis.  Discussed with     Dr. Allyson Sabal, it was decided to take him off the table and came back     for intervention subsequent day.  Cardiac cath on October 02, 2010,     successful cutting balloon atherectomy in distal right coronary     artery for in-stent restenosis in the setting of acute coronary     artery syndrome with excellent angiographic results.  DISCHARGE MEDICATIONS: 1. Acetaminophen 325 mg 2 tablets by mouth every 4 hours as needed for     pain. 2. Hydrochlorothiazide 25 mg one-half tablet by mouth daily. 3. Metoprolol succinate 25 mg XL tab 1 tablet by mouth daily. 4. Potassium chloride 20 mEq 1 tablet by mouth daily. 5. Simvastatin 40 mg 1 tablet by mouth daily. 6. Aspirin 325 mg one tablet by mouth daily. 7. Diovan 320 mg 1 tablet by mouth daily. 8. Nitroglycerin sublingual 0.4 mg 1 tablet under the tongue every 5     minutes for chest pain, three doses total. 9. Omeprazole 20 mg 1 tablet by mouth daily. 10.Plavix 75 mg 1 tablet by mouth daily with meal.  DISPOSITION:  He will be discharged home in stable  condition.  He is to increase his activity slowly.  May shower and bathe.  No lifting for 2 days.  He is recommended to eat a low-sodium heart-healthy diet.  If his catheter site becomes very painful, swollen, or discharges fluid or pus, he is to call our office.  He will follow with Dr. Clarene Duke on Wednesday, October 23, 2010, at 1:30 p.m.  It was also recommended that he check his weight daily on a scale and call our office if he has weight gain of 1-2 pounds in a day or 4-5 pounds in a week.  Also recommended that he follows up with his cancer doctor for which he has an appointment regarding his stable anemia.    ______________________________ Wilburt Finlay, PA  I saw Mr. Erny on the day of discharge.  He was stable for discharge as indicated in the discharge summary.  I agree with Bryan's dictated note.   ______________________________ Landry Corporal, MD    BH/MEDQ  D:  10/03/2010  T:  10/04/2010  Job:  [id number]  cc:   Thereasa Solo. Little, M.D. Samul Dada, M.D.  Electronically Signed by Wilburt Finlay PA on 10/18/2010 04:22:59 PM Electronically Signed by Bryan Lemma MD on 10/18/2010 04:53:13 PM

## 2010-10-28 NOTE — Cardiovascular Report (Signed)
  NAME:  Dakota Liu, Dakota Liu NO.:  [id number]  MEDICAL RECORD NO.:  [id number]           PATIENT TYPE:  I  LOCATION:  2919                         FACILITY:  [facility]  PHYSICIAN:  Ritta Slot, MD     DATE OF BIRTH:  Dec 22, 1927  DATE OF PROCEDURE:  10/01/2010 DATE OF DISCHARGE:                           CARDIAC CATHETERIZATION   This is a left heart catheterization.  PATIENT PROFILE:  Dakota Liu is a very pleasant 77 year old gentleman who has had remote MI in 1986 with bypass grafting in 1988 with a LIMA to the LAD, SVG to the diagonal, SVG to OM-1 and OM-2, and SVG to RCA. He recently underwent cardiac cath by Dr. Tresa Endo in December 2011 during which time he intervened upon the native RCA through the SVG to the RCA by placing a bare-metal stent.  He comes back in now complaining of chest pain.  He is therefore brought for repeat cardiac catheterization.  After informed consent, the patient was placed in the supine position. His right groin was prepped and draped in the usual sterile fashion.  He received 1 mg of Versed and 25 mcg of fentanyl and 20 mL of lidocaine was used for local anesthesia.  Using modified Seldinger technique, a 5-French sheath was placed in RFA. Through the 5-French sheath was placed a JL-4 catheter.  This was engaged in left main without difficulty.  Visualization of native left circulation was obtained in the AP view.  The JL-4 was then exchanged over J-wire for a JR-4 catheter.  The JR-4 catheter was used to engage into the LIMA to the LAD, SVG to D1, SVG to OM-3, and SVG to RCA without difficulty, and the native right.  FINDINGS:  AO pressure 139/73, mean of 99.  The aortic valve was not crossed today.  The LIMA to the LAD was found to be patent, free of any disease.  The SVG to D1 was found to be patent, free of any disease.  Thee SVG to sequential OM-1, OM-2, and OM-3 were also patent and free of any disease.  SVG to the RCA was  patent and free of any disease.  The native LAD and the native circumflex they all had 100% stenosis in their proximal portions.  The point of insertion of the SVG to the RCA was free of any disease, but there was a stent that was placed in 2011 by Dr. Tresa Endo in the native RCA just beyond the point of insertion.  This stent now had an 80-90% diffuse in-stent restenosis. After discussion with Dr. Allyson Sabal, it was decided to take him off the table today and he will come back for intervention tomorrow to the 90% in-stent restenosis of the native RCA, initially to be intervened upon through the SVG to the RCA.     Ritta Slot, MD     HS/MEDQ  D:  10/01/2010  T:  10/02/2010  Job:  [id number]  Electronically Signed by Ritta Slot MD on 10/28/2010 04:04:31 PM

## 2010-11-19 NOTE — Assessment & Plan Note (Signed)
OFFICE VISIT   NOX, TALENT  DOB:  11-04-1927                                       08/14/2009  [id number]#:[id number]   The patient returns for his continued follow-up regarding abdominal  aortic aneurysm resection done November 11 for an infrarenal aortic  aneurysm with an aortic/common iliac graft.  He has done very well since  discharge from the hospital and today reports that he has regained his  preoperative strength level and his appetite is good.  His bowel habits  have returned to normal and he is back to normal activity level.  He is  having some mild discomfort in his abdominal incision but seems to  continue to be improving.  He did see Dr. Clarene Duke recently who felt that  he was progressing nicely.   PHYSICAL EXAMINATION:  On exam today blood pressure 156/79, heart rate  68, respirations 14, temperature 98.  Abdominal incision is well-healed.  No evidence of ventral hernia.  He has excellent femoral pulses  bilaterally.  Well-perfused lower extremities.   I am very pleased with his progress and be happy to see him again in the  future as necessary.  He will continue to follow-up with his medical  doctor, Dr. Andrey Campanile.     Quita Skye Hart [place], M.D.  Electronically Signed   JDL/MEDQ  D:  08/14/2009  T:  08/15/2009  Job:  [id number]

## 2010-11-19 NOTE — Assessment & Plan Note (Signed)
OFFICE VISIT   Dakota Liu, Dakota Liu  DOB:  1928-04-02                                       05/01/2009  WUXLK#:[id number]   Please include this with the patient's new patient consultation for his  history and physical.   Dakota Liu returns today for further discussion regarding his  infrarenal abdominal aortic aneurysm.  He had a cardiac evaluation by  Dr. Caprice Kluver and I have discussed that with him, and reviewed the  results which revealed no evidence of ischemia on a Cardiolite study  with an ejection fraction of 31%.  He is felt to be at moderate risk by  Dr. Clarene Duke for surgery.  I have also reviewed his CT angiogram performed  at [facility] in detail and discussed that with other physicians  regarding stent grafting which he is not felt to be a good candidate  for.  He continues to be asymptomatic from the standpoint of his  abdominal aortic aneurysm.  He has technical factors including a short  infrarenal aortic neck which makes him a poor candidate for aortic stent  grafting.   REVIEW OF SYSTEMS:  He denies any chest pain, dyspnea on exertion, PND,  orthopnea, hemoptysis.  No GI or GU symptoms at this time.   Chronic stable medical problems include:  1. Hypertension.  2. Coronary artery disease.  3. Lymphoma.  4. Hyperlipidemia.   FAMILY HISTORY:  Positive for diabetes in his mother, stroke in his  father.  Negative for coronary artery disease.   He has not smoked in 30 years.   PHYSICAL EXAM:  Blood pressure 156/82, heart rate 66, respirations 16.  GENERAL:  Alert and oriented x3.  NECK:  Supple, 3+ carotid pulses palpable.  No bruits are audible.  HEENT:  Exam is normal.  CHEST:  Clear to auscultation.  CARDIOVASCULAR:  Regular rhythm, no murmurs.  ABDOMEN:  Soft, nontender with a pulsatile mass approximating 5-6 cm.  He has 3+ femoral and posterior tibial pulses bilaterally.   He has had his old records summarized and has had direct  visualization  and independent interpretation of his CT angiogram.   I think the best plan would be open resection grafting of his abdominal  aortic aneurysm with aorto-bi-common iliac graft.  I have discussed this  at length with him and his wife, and the risks involved, and they would  like to proceed.  We scheduled that for Thursday, November 11, at [facility].   Quita Skye. Hart [place], M.D.  Electronically Signed   JDL/MEDQ  D:  05/01/2009  T:  05/02/2009  Job:  [id number]   cc:   Thereasa Solo. Little, M.D.  Gloriajean Dell. Andrey Campanile, M.D.

## 2010-11-19 NOTE — Assessment & Plan Note (Signed)
OFFICE VISIT   Dakota Liu, Dakota Liu  DOB:  1927-10-01                                       06/12/2009  YQMVH#:[id number]   The patient returns 2 weeks post resection and grafting of an infrarenal  abdominal aortic aneurysm performed on November 11.  He required  reinsertion of his nasogastric tube while in the hospital and had some  problems with diarrhea following his discharge but that has now all  resolved and he is beginning to turn the corner and feel stronger.  He  is increasing his activity.  He still does not have a good appetite but  is not losing weight.  He states that food does not taste quite right.   PHYSICAL EXAM:  Today blood pressure 147/76, heart rate 82, respirations  14, temperature 98.  His abdominal incision has healed nicely.  There is  no evidence of ventral hernia.  He has 3+ femoral pulses bilaterally  with well-perfused lower extremities.   I think he is making good progress and I have encouraged him to continue  to increase his activity as tolerated.  I plan to see him back in 2  months for continued followup.   Quita Skye Hart [place], M.D.  Electronically Signed   JDL/MEDQ  D:  06/12/2009  T:  06/13/2009  Job:  [id number]   cc:   Gloriajean Dell. Andrey Campanile, M.D.

## 2010-11-19 NOTE — Assessment & Plan Note (Signed)
OFFICE VISIT   Dakota Liu, Dakota Liu  DOB:  [birth year]                                       04/10/2009  EAVWU#:[id number]   Patient returns for further discussion about his infrarenal abdominal  aortic aneurysm, referred for further evaluation by Dr. Caprice Kluver.  CT  angiogram was performed, which I have reviewed in depth.  He does have  an aneurysm measuring up to 5.4 cm in maximum diameter.  The infrarenal  neck is borderline, measuring 12 to 15 mm in length.  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  He remains asymptomatic from his coronary artery  disease having undergone coronary artery bypass grafting in 1988 by Dr.  Andrey Campanile.  He has had no abdominal or back symptoms.   I discussed this with Dr. Clarene Duke today, who will further evaluate  patient from a cardiac standpoint with radionucleotide scanning.  Will  further review these CT angiograms regarding potential for stent  grafting and patient will return in 2 to 3 weeks for further discussion  about this.   Quita Skye Hart [place], M.D.  Electronically Signed   JDL/MEDQ  D:  04/10/2009  T:  04/11/2009  Job:  [id number]

## 2010-11-19 NOTE — Op Note (Signed)
NAME:  Dakota Liu, Dakota Liu NO.:  [id number]   MEDICAL RECORD NO.:  [id number]          PATIENT TYPE:  OIB   LOCATION:  1429                         FACILITY:  [facility]   PHYSICIAN:  Sigmund I. Patsi Sears, M.D.DATE OF BIRTH:  [birth year]   DATE OF PROCEDURE:  06/14/2007  DATE OF DISCHARGE:                               OPERATIVE REPORT   PREOPERATIVE DIAGNOSIS:  Chronic epididymitis orchitis with impending  abscess formation.   POSTOPERATIVE DIAGNOSIS:  Chronic epididymitis orchitis with impending  abscess formation.   OPERATIONS:  Left inguinal orchiectomy.   SURGEON:  Sigmund I. Patsi Sears, M.D.   ANESTHESIA:  General LMA.   PREPARATION:  After appropriate preanesthesia, the patient is brought to  the operating room, placed on the operating table in dorsal supine  position where general LMA anesthesia was induced.  He remained in this  position, where the left inguinal area and scrotum were shaved, prepped  with Betadine solution, and draped in usual fashion.   REVIEW OF HISTORY:  This 77 year old male, has a history of 4-week  increasing testicle pain on the left side, resistant to multiple  antibiotics.  Because of his continued pain, and ultrasound evidence of  impending abscess, the patient has elected to proceed with orchiectomy.   PROCEDURE:  A 5 cm incision was made in the left lower inguinal canal  and subcutaneous tissue dissected with electrosurgical unit.  The left  spermatic cord identified at the level of pubic tubercle, and a Penrose  drain was placed underneath the spermatic cord.   The testicle was then dissected free from its attachments in the  scrotum, and delivered into the wound.  The spermatic cord is grossly  edematous.  Marcaine block is placed proximalward, dissection of the  cord is accomplished and using 3-0 Vicryl ties, all of vessels and large  lymphatics are ligated.  The vas was isolated and ligated separately  with 3-0 Vicryl  suture.  8-0 Vicryl sutures used to ligate the main  vascular supply of the testicle, and a 0 Vicryl suture ligature was used  in the main vascular pedicle.  There was no bleeding noted.  The  testicle was amputated and removed.  The wound was irrigated with saline  solution.  A Penrose drain was placed through a separate stab wound in  the inferior portion of the scrotum and sutured in place with 3-0 Vicryl  suture.   The wound was then closed in two layers with interrupted 3-0 Vicryl  sutures, as well as skin stapler.  Sterile dressing was applied.  Kerlix  and mesh pants were placed.  The patient received IV Toradol.  He  received IV Ancef during the procedure as well.  He was awakened and  taken to recovery room in good condition.      Sigmund I. Patsi Sears, M.D.  Electronically Signed     SIT/MEDQ  D:  06/14/2007  T:  06/15/2007  Job:  [id number]

## 2010-11-19 NOTE — Consult Note (Signed)
NEW PATIENT CONSULTATION   Dakota Liu, Dakota Liu  DOB:  October 30, 1927                                       03/19/2009  VHQIO#:[id number]   The patient is a 77 year old male patient referred for an infrarenal  abdominal aortic aneurysm which is being followed by Dr. Clarene Duke at  [facility] for the last 3 years.  The most recent  ultrasound previously was 4.7 cm in maximum diameter which has now  increased to 5 cm and he was referred for further evaluation.   PAST MEDICAL HISTORY:  1. Hypertension.  2. Coronary artery disease with previous myocardial infarction in 1986      and underwent coronary artery bypass grafting in 1988 by Dr.      Andrey Campanile.  3. Also has lymphoma 4 years ago.  No active disease.  4. Negative for diabetes, COPD.  5. Does have hyperlipidemia.   FAMILY HISTORY:  Positive for diabetes in his mother, stroke in his  father, negative for coronary artery disease.   PREVIOUS SURGERY:  1. Coronary artery bypass grafting in 1988.  2. Tonsillectomy.  3. Removal left testicle for infection.  4. Left neck biopsy for lymphoma.   SOCIAL HISTORY:  The patient is married and has 3 children, is retired.  Smoked a pack of cigarettes a day for 30 years, but quit in 1986.  Does  not use alcohol.   REVIEW OF SYSTEMS:  He has had some recent weight loss, which he states  happens in the summer each year.  No chest pain, dyspnea on exertion,  PND, orthopnea.  Denies any other pulmonary, GI, or GU symptoms.  He  does have bilateral foot drop, unknown etiology.  Otherwise, negative in  all systems.   ALLERGIES:  None known.   MEDICATIONS:  Please see health history exam.   PHYSICAL EXAM:  Blood pressure is 130/92, heart rate is 67, respirations  20.  In general, he is an elderly male in no apparent distress.  Alert  and oriented x3.  Neck is supple, 3+ carotid pulses palpable.  No bruits  are audible.  Neurologic exam is normal.  No palpable  adenopathy in the  neck.  Chest is clear to auscultation.  Cardiovascular exam is a regular  rhythm with no murmurs.  Abdomen is soft with a 5 cm pulsatile mass  which is nontender.  He has 3+ femoral and popliteal pulses bilaterally.  Both feet are well perfused.   IMPRESSION:  Infrarenal abdominal aortic aneurysm approximately 5 cm by  recent ultrasound.   PLAN:  To obtain a CT angiogram and he will return for further  discussion regarding this.  I had a long discussion with him and his  wife regarding potential stent grafting versus open surgery if the  aneurysm large enough to require that at this time.  He will return in  the near future for further discussion.   Quita Skye Hart Rochester, M.D.  Electronically Signed   JDL/MEDQ  D:  03/19/2009  T:  03/19/2009  Job:  [id number]   cc:   Thereasa Solo. Little, M.D.

## 2010-11-22 NOTE — H&P (Signed)
NAME:  Dakota Liu, Dakota Liu NO.:  [id number]   MEDICAL RECORD NO.:  [id number]          PATIENT TYPE:  INP   LOCATION:  0106                         FACILITY:  [facility]   PHYSICIAN:  Merlene Laughter. Renae Gloss, M.D.DATE OF BIRTH:  26-Sep-1927   DATE OF ADMISSION:  01/13/2006  DATE OF DISCHARGE:                                HISTORY & PHYSICAL   PRIMARY CARE PHYSICIAN:  Gloriajean Dell. Andrey Campanile, M.D.   CODE STATUS:  Full code.   CHIEF COMPLAINT:  Chest pain.   HISTORY OF PRESENT ILLNESS:  Dakota Liu is a 77 year old gentleman who  complains of 1-day history of mid sternal chest pain radiating to his back.  He denies shortness of breath, palpitations, nausea, vomiting or  diaphoresis.  He has no other acute constitutional or systemic complaints at  this time.   ALLERGIES:  No known drug allergies.   MEDICATIONS:  1.  Aspirin 81 mg p.o. daily.  2.  PPI.   PAST MEDICAL HISTORY:  1.  History of non-Hodgkin's lymphoma diagnosed in 2006.  He is currently      undergoing Taxol cycles.  He is status post radiation treatment.  2.  History of MI and CABG in 1986, cardiologist Dr. Clarene Duke.  3.  Hypertension, however, the patient has been normotensive for several      months and is not on any antihypertensive medications at this time.   FAMILY HISTORY:  Noncontributory secondary to the patient's age.   SOCIAL HISTORY:  The patient is married and lives with his wife.  He is  independent.  He denies tobacco or alcohol.   REVIEW OF SYSTEMS:  Significant for chest pain as mentioned in HPI.  The  remainder of his review of systems are unremarkable.   PHYSICAL EXAMINATION:  VITAL SIGNS:  Temperature 98.6, pulse 77,  respirations 20, blood pressure 109/63, O2 saturations on room air 95%.  GENERAL:  Well-developed, well-nourished, white male in no acute distress.  HEENT:  TMs were within normal limits bilaterally.  No oropharyngeal  lesions.  NECK:  Supple, no masses, 2+ carotids, no bruits.  LUNGS:  Decreased breath sounds in the bases bilaterally.  HEART:  S1, S2, regular rate and rhythm with no murmurs, rubs or gallops.  ABDOMEN:  Soft, nontender, nondistended, positive bowel sounds.  EXTREMITIES:  No clubbing, cyanosis or edema.  SKIN:  Warm and intact.  NEUROLOGIC:  Alert and oriented x3.  Cranial nerves 2-12 grossly intact.   LABORATORY DATA AND X-RAY FINDINGS:  Hemoglobin 13.4, WBC 8.2, platelets  170.  Sodium 138, potassium 4.3, chloride 102, CO2 27, BUN 13, creatinine  0.9, glucose 172.  AST 161, ALT 151, Alk phos 135, total bilirubin 5.7.  Troponin enzyme less than 0.0-0.5, CK-MB 2.9.   Abdominal ultrasound shows gallbladder sludge, but no stones.   ASSESSMENT/PLAN:  1.  Chest pain.  Dakota Liu has several cardiac risks factors especially      including his remote history of myocardial infarction and coronary      artery bypass graft as well as history of hypertension and age.  He will  be admitted to telemetry for observation and to rule out an ischemic      cause of his chest pain.  The possibility of aortic dissection is      concerning especially considering the quality and severity of his chest      pain radiating to his back.  At this time, however, chest computed      tomography scan results are pending.  His chest pain has been well-      controlled with one dose of morphine in the emergency department.  Other      possible etiologies would include gastrointestinal concerns such as      peptic ulcer disease or gastritis.  Portable chest x-ray does reveal the      left lower lobe opacity which may be significant for community-acquired      pneumonia.  He has increased risk of pneumonia given his      immunosuppressive state.  He will be started on Rocephin and Zithromax.  2.  Elevated liver function tests.  The etiology of his liver function      elevation is unclear at this time.  Abdominopelvic computed tomography      scan results are pending.   Currently, he is asymptomatic from a      gastrointestinal standpoint.  3.  Hypertension.  Dakota Liu has been off of antihypertensives for several      months.  We will continue to monitor his blood pressure off      antihypertensives at this time.           ______________________________  Merlene Laughter Renae Gloss, M.D.     KRS/MEDQ  D:  01/13/2006  T:  01/13/2006  Job:  [id number]   cc:   Gloriajean Dell. Andrey Campanile, M.D.  Fax: [phone]

## 2010-11-22 NOTE — Discharge Summary (Signed)
NAME:  Dakota Liu, Dakota Liu NO.:  [id number]   MEDICAL RECORD NO.:  [id number]          PATIENT TYPE:  INP   LOCATION:  1432                         [facility]ACILITY:  [facility]   PHYSICIAN:  Isidor Holts, M.D.  DATE OF BIRTH:  01-13-1928   DATE OF ADMISSION:  01/13/2006  DATE OF DISCHARGE:  01/16/2006                                 DISCHARGE SUMMARY   PRIMARY CARE PHYSICIAN:  Dr. Benedetto Goad   DISCHARGE DIAGNOSES:  1.  Chest pain, likely noncardiac.  2.  Gastroesophageal reflux disease.  3.  Left lower lobe pneumonia.  4.  Hypertension.  5.  History of non-Hodgkin's lymphoma.  6.  Abnormal LFTs/jaundice, query etiology.   DISCHARGE MEDICATIONS:  1.  Protonix 40 mg p.o. daily.  2.  Enteric-coated aspirin 81 mg p.o. daily.  3.  Avelox 400 mg p.o. daily until January 26, 2006.  4.  Lopressor 25 mg p.o. daily.  5.  Imdur 30 mg p.o. daily.  6.  Ultram 50 mg p.o. q.6 hours. #28.   PROCEDURES:  1.  Portable chest x-ray dated January 13, 2006.  This showed subtle left lower      lobe pneumonia.  2.  Repeat 2-view chest x-ray dated January 13, 2006.  This showed posterior      left lower lobe airspace opacity.  3.  Abdominal ultrasound scan dated January 13, 2006.  This shows gallbladder      sludge without signs of cholelithiasis or definite acute cholecystitis.      No biliary dilatation.  A 4.5 cm abdominal aortic aneurysm, obscured      pancreas by bowel gas.  4.  CT abdomen and pelvis dated January 13, 2006.  This showed aneurysmal      dilatation of a distal abdominal aorta with mural thrombus and      atherosclerotic calcification.  The liver was unremarkable except for a      tiny simply cyst in the right lobe.  The spleen, gallbladder, pancreas,      adrenal glands, and both kidneys had a normal appearance.  There was a      small dilatation of distal abdominal aorta, measured approximately 4.1      cm in greatest AP dimension.  Sigmoid diverticula were noted without  radiographic evidence of diverticulitis, no ileal or inguinal adenopathy      was present.  No retroperitoneal adenopathy.  5.  Chest CT angiogram dated January 13, 2006.  This showed small, nonspecific      mediastinal nodes but no evidence of coarse adenopathy.  No evidence of      pulmonary embolus.  No evidence of thoracic aortic aneurysm or      dissection.  6.  Stress Myoview dated January 13, 2006.  This showed enlarged fixed defect      involving the apex, septum, and anterior wall, compatible with old      infarct/scar.  No significant reversibility to suggest ischemia.  LVEF      was 45%.  7.  2 D echocardiogram dated January 14, 2006.  This showed LVEF of 45-50%,  i.e., mildly decreased LV function.  There was moderate hypokinesis of      the anterior lateral wall, moderate hypokinesis of the anterior septal      wall, aortic valve thickness mildly increased.  There was mild to      moderate mitral valvular regurgitation.  The left atrium was moderately      dilated.   CONSULTATION:  1.  Dr. Clarene Duke, cardiologist.  2.  Dr. Yates Decamp, cardiologist.   ADMISSION HISTORY:  As in H&P notes of January 13, 2006, dictated by Dr.  Andi Devon.  However, in brief, this is a 77 year old male, with known  history of coronary artery disease, status post MI, status post CABG 1986,  history of non-Hodgkin's lymphoma, diagnosed 2006, undergoing chemotherapy  under the auspices of Dr. Johney Frame, history of diet controlled  hypertension, who presents with a 1 day history of mid sternal chest pain,  radiating to the back, not associated with shortness of breath, palpations,  nausea, vomiting, or diaphoresis.  He was admitted for evaluation,  investigation, and management.   CLINICAL COURSE:  1.  Chest pain.  The patient presents with a 1 day history of mid sternal      chest pain.  Given his background history of coronary artery disease, as      well as hypertension, it was felt that the  patient had sufficient risk      factors for coronary ischemia.  EKG showed no acute ischemic changes.      Cardiac enzymes remained nonelevated.  However, we were sufficiently      concerned to request cardiology consultation, which was provided by Dr.      Clarene Duke and Dr. Jacinto Halim.  2 D echocardiogram confirmed moderate hypokinesis      of the anterolateral wall as well as the anterior septal wall with an EF      of 45-50%.  The patient underwent stress Myoview on January 16, 2006, which      showed a fixed defect involving the apex and much of the septum and      anterior walls, but no evidence of ischemia.  Ejection fraction was      calculated at 45%.  Per cardiology recommendations, the patient is for      medical management only.  The patient has therefore been placed on      Aspirin, Imdur, and Lopressor.  He had no further recurrence of chest      pain, during the course of his hospital stay.  It is pertinent to note      that chest CT angiogram dated January 13, 2006, showed no evidence of      pulmonary embolism or dissecting thoracic aortic aneurysm, although      admitting studies including abdominal ultrasound scan and abdominal CT      scan confirmed the stable 4.1 cm abdominal aortic aneurysm.   1.  Left lower lobe pneumonia.  This was an incidental finding on chest x-      ray dated January 13, 2006.  Patient was treated with  a 4 day course of      Rocephin/azithromycin with good clinical response.  He is being      discharged on Avelox and is scheduled to complete the course on January 26, 2006.   1.  Abnormal liver function tests.  The patient was noted to have a tinge of      jaundice at the  time of initial presentation.  Subsequent LFTs showed      the following findings:  Total bilirubin 5.7, alkaline phosphatase 135,      AST 161, ALT 151.  Abdominal CT scan and abdominal ultrasound scan     showed no evidence of hepatic pathology, apart from a simple cyst and no       evidence of biliary duct dilatation.  It was felt that abnormal LFTs may      have been secondary to chemotherapy, utilized for the management of non-      Hodgkin's lymphoma, but I have discussed this with Dr. Johney Frame,      oncologist, and he has assured me that the chemotherapeutic agent      utilized in the management of this patient's case has no hepatotoxicity,      although it is possible that it may activate an already preexistent      hepatitis. A viral hepatitis profile was done, however, at the time of      this dictation, results were not yet available.  The patient's LFTs have      trended towards improvement, and as a matter of fact, on January 16, 2006,      although total bilirubin had climbed to 9.1.  Alkaline phosphatase was      115, AST 23, ALT 49.  Of course, it is possible that the patient's      pneumonia may be the culprit.   1.  History of non-Hodgkin's lymphoma.  This is currently being managed by      Dr. Johney Frame.  The patient has a scheduled appointment to see him in      September 2007.   1.  Hypertension.  This was diet controlled prior to admission, and the      patient remained normotensive throughout the course of his hospital      stay.   DISPOSITION:  The patient from day 1 of hospital admission, has been  insistent on being discharged home.  As a matter of fact, on January 16, 2006,  he stated quite emphatically to me, that he was going to go home, whether I  concurred or not.  I have reviewed the patient's clinical data and performed  a thorough physical examination, and it appears to me that on the basis of  the data currently available to [place], the patient is clinically stable for  discharge, provided that he follows up meticulously with his primary MD,  with regard to monitoring his liver functions, and keeps his scheduled  appointment with his oncologist.  All of this has been explained to the  patient and his spouse.  They have verbalized  understanding.   DIET:  Healthy heart diet.   ACTIVITY:  As tolerated.  Recommended to increase activity slowly.   WOUND CARE:  Not applicable.   PAIN MANAGEMENT:  Refer to discharge medication list.   FOLLOW UP INSTRUCTIONS:  The patient is instructed to follow up with his  primary MD, Dr. Benedetto Goad, within 1 week of discharge.  He is to call for  an appointment.  He has a scheduled appointment with Dr. Julieanne Manson,  cardiologist, on February 18, 2006, at 9:15 a.m.  Telephone number [phone],  at [address].  He also has a scheduled appointment with Dr. Johney Frame for September 2007 and has been recommended to call Dr. Mamie Levers  office to ascertain the precise date.  We have recommended that  Dr. Benedetto Goad monitor the patient's LFTs and follow his hepatitis serology.      Isidor Holts, M.D.  Electronically Signed     CO/MEDQ  D:  01/16/2006  T:  01/16/2006  Job:  [id number]   cc:   Gloriajean Dell. Andrey Campanile, M.D.  Fax: [phone]   Thereasa Solo. Little, M.D.  Fax: [phone]   Samul Dada, M.D.  Fax: [phone]

## 2010-11-22 NOTE — Op Note (Signed)
NAME:  Dakota Liu, Dakota Liu NO.:  [id number]   MEDICAL RECORD NO.:  [id number]          PATIENT TYPE:  AMB   LOCATION:  SDS                          FACILITY:  [facility]   PHYSICIAN:  Kinnie Scales. Annalee Genta, M.D.DATE OF BIRTH:  1928/04/20   DATE OF PROCEDURE:  03/03/2005  DATE OF DISCHARGE:                                 OPERATIVE REPORT   PRE AND POSTOPERATIVE DIAGNOSIS:  1.  Right lateral neck mass.  2.  Right tonsil enlargement.   SURGICAL PROCEDURES:  1.  Direct laryngoscopy.  2.  Right tonsillectomy.   ANESTHESIA:  General endotracheal.   SURGEON:  Dr. Annalee Genta   COMPLICATIONS:  None.   BLOOD LOSS:  Minimal.   Patient transferred from the operating room to recovery room in stable  condition.   BRIEF HISTORY:  [name] is a 77 year old white male who is referred by  his primary care physician, Dr. Benedetto Goad for evaluation of a new onset  right neck mass. The patient noted a two-week history of increasing right  lateral neck swelling, no significant associated discomfort, erythema or  other symptoms and no other adenopathy was noted.  A CT scan was obtained  which showed significant lymphadenopathy in the right neck as well was  enlargement in the right tonsil region. No other ulceration, mass or lesions  noted in the upper aerodigestive tract.  Examination in the office revealed  similar findings. Given the patient's history and examination, I recommended  direct laryngoscopy and a biopsy of the right tonsil to include possible  tonsillectomy. Risks, benefits and possible complications of procedure were  discussed in detail. The patient and his family understood and concurred  plan for surgery which scheduled as above.   SURGICAL PROCEDURE:  [name] was brought to the operating room on March 03, 2005, placed in supine position on the operating table. General  endotracheal anesthesia was established without difficulty. The patient  adequately  anesthetized. A Dedo laryngoscope was used to perform direct  laryngoscopy.  Oral cavity, oropharynx, base of tongue, supraglottis and  larynx were thoroughly examined. The only finding was a enlarged right  tonsil measuring approximately 2 x 3 cm. There was no ulceration or mucosal  lesion and the tonsil was firm but mobile in the tonsillar fossa.  The  hypopharynx was also examined and esophageal introitus and pyriform sinus  was normal bilaterally.  Given these findings, I undertook a tonsillectomy  for tissue diagnosis.   Tonsillectomy was then performed.  A Crowe-Davis mouth gag inserted out  difficulty with a gag in place. The right tonsil was gently palpated. It was  firm but mobile.  The right tonsil distracted medially and using Bovie  electrocautery, dissection was carried out the subcapsular space removing  the entire right tonsil from superior pole to tongue base. There was no  significant deep tissue adhesion and no evidence of extension into the  lateral oral pharyngeal wall.  Entire tonsil was removed and sent to  pathology for gross and microscopic evaluation. The patient's oral cavity,  oropharynx then irrigated and suctioned. The right tonsillar  fossa was  gently abraded with a dry tonsil sponge. Several areas of point hemorrhage  were cauterized with Bovie electrocautery. The mouth gag was released and  reapplied. There was no active bleeding. Oral gastric tube was passed, the  stomach contents were aspirated. The patient awakened from the anesthetic.  Crowe-Davis mouth gag was released, removed, no loose or broken teeth. The  patient was extubated, was transferred from the operating room to recovery  room in stable condition. No complications. Blood loss was minimal.           ______________________________  Kinnie Scales. Annalee Genta, M.D.     DLS/MEDQ  D:  16/04/9603  T:  03/03/2005  Job:  [id number]

## 2011-01-09 ENCOUNTER — Encounter: Payer: Medicare Other | Admitting: *Deleted

## 2011-01-13 ENCOUNTER — Encounter: Payer: Self-pay | Admitting: *Deleted

## 2011-01-15 ENCOUNTER — Telehealth: Payer: Self-pay | Admitting: Internal Medicine

## 2011-01-15 NOTE — Telephone Encounter (Signed)
Pt wife calling need help with set up on device.

## 2011-01-15 NOTE — Telephone Encounter (Signed)
Procedure reviewed for sending remote transmission with the patient and his wife.  He will try and resend later today

## 2011-04-07 LAB — BUN: BUN: 10

## 2011-04-07 LAB — CREATININE, SERUM
Creatinine, Ser: 0.96
GFR calc non Af Amer: >60

## 2011-04-14 LAB — CBC
HCT: 33.8 — ABNORMAL LOW
Platelets: 373
RBC: 3.9 — ABNORMAL LOW
WBC: 9.5

## 2011-04-14 LAB — ANAEROBIC CULTURE

## 2011-04-14 LAB — BASIC METABOLIC PANEL
BUN: 11
Chloride: 99
GFR calc Af Amer: >60
GFR calc non Af Amer: >60
Potassium: 3.3 — ABNORMAL LOW

## 2011-04-14 LAB — TISSUE CULTURE

## 2011-04-15 ENCOUNTER — Other Ambulatory Visit (HOSPITAL_COMMUNITY): Payer: Self-pay | Admitting: Oncology

## 2011-04-15 ENCOUNTER — Encounter (HOSPITAL_BASED_OUTPATIENT_CLINIC_OR_DEPARTMENT_OTHER): Payer: Medicare Other | Admitting: Oncology

## 2011-04-15 DIAGNOSIS — Z95810 Presence of automatic (implantable) cardiac defibrillator: Secondary | ICD-10-CM

## 2011-04-15 DIAGNOSIS — D649 Anemia, unspecified: Secondary | ICD-10-CM

## 2011-04-15 LAB — BASIC METABOLIC PANEL
BUN: 10
CO2: 31
Chloride: 91 — ABNORMAL LOW
Glucose, Bld: 135 — ABNORMAL HIGH
Potassium: 3.6

## 2011-04-15 LAB — MORPHOLOGY

## 2011-04-15 LAB — CBC WITH DIFFERENTIAL/PLATELET
Basophils Absolute: 0 10*3/uL (ref 0.0–0.1)
Eosinophils Absolute: 0.1 10*3/uL (ref 0.0–0.5)
HCT: 29.7 % — ABNORMAL LOW (ref 38.4–49.9)
LYMPH%: 14.5 % (ref 14.0–49.0)
MCV: 75.5 fL — ABNORMAL LOW (ref 79.3–98.0)
MONO#: 0.5 10*3/uL (ref 0.1–0.9)
NEUT#: 4.8 10*3/uL (ref 1.5–6.5)
NEUT%: 75.8 % — ABNORMAL HIGH (ref 39.0–75.0)
Platelets: 235 10*3/uL (ref 140–400)
WBC: 6.3 10*3/uL (ref 4.0–10.3)

## 2011-04-15 LAB — COMPREHENSIVE METABOLIC PANEL
AST: 13 U/L (ref 0–37)
Albumin: 4.2 g/dL (ref 3.5–5.2)
Alkaline Phosphatase: 64 U/L (ref 39–117)
BUN: 15 mg/dL (ref 6–23)
Potassium: 3.3 mEq/L — ABNORMAL LOW (ref 3.5–5.3)

## 2011-04-15 LAB — URINALYSIS, ROUTINE W REFLEX MICROSCOPIC
Bilirubin Urine: NEGATIVE
Glucose, UA: NEGATIVE
Hgb urine dipstick: NEGATIVE
Ketones, ur: NEGATIVE
pH: 5

## 2011-04-15 LAB — CHCC SMEAR

## 2011-04-19 LAB — IRON AND TIBC
%SAT: 7 % — ABNORMAL LOW (ref 20–55)
Iron: 28 ug/dL — ABNORMAL LOW (ref 42–165)
UIBC: 382 ug/dL (ref 125–400)

## 2011-04-19 LAB — TRANSFERRIN RECEPTOR, SOLUABLE: Transferrin Receptor, Soluble: 3.08 mg/L — ABNORMAL HIGH (ref 0.76–1.76)

## 2011-04-25 ENCOUNTER — Other Ambulatory Visit (HOSPITAL_COMMUNITY): Payer: Self-pay | Admitting: Oncology

## 2011-04-25 DIAGNOSIS — C8291 Follicular lymphoma, unspecified, lymph nodes of head, face, and neck: Secondary | ICD-10-CM

## 2011-04-25 DIAGNOSIS — Z09 Encounter for follow-up examination after completed treatment for conditions other than malignant neoplasm: Secondary | ICD-10-CM

## 2011-04-25 DIAGNOSIS — C8581 Other specified types of non-Hodgkin lymphoma, lymph nodes of head, face, and neck: Secondary | ICD-10-CM

## 2011-04-25 DIAGNOSIS — D509 Iron deficiency anemia, unspecified: Secondary | ICD-10-CM

## 2011-07-14 ENCOUNTER — Encounter: Payer: Self-pay | Admitting: *Deleted

## 2011-07-17 ENCOUNTER — Other Ambulatory Visit: Payer: Self-pay | Admitting: Vascular Surgery

## 2011-09-19 ENCOUNTER — Telehealth: Payer: Self-pay | Admitting: *Deleted

## 2011-09-19 NOTE — Telephone Encounter (Signed)
09-19-11 pt sent response to letter re past due device check, he is now having checks at [facility], dr al little/mt

## 2011-10-10 ENCOUNTER — Telehealth: Payer: Self-pay | Admitting: Oncology

## 2011-10-10 NOTE — Telephone Encounter (Signed)
pt r/s to 5/16 as he is havubg hrt trble now and wishes to deal with this first  aom

## 2011-10-14 ENCOUNTER — Other Ambulatory Visit: Payer: Medicare Other | Admitting: Lab

## 2011-10-14 ENCOUNTER — Ambulatory Visit: Payer: Medicare Other | Admitting: Oncology

## 2011-11-06 ENCOUNTER — Telehealth: Payer: Self-pay | Admitting: Oncology

## 2011-11-06 NOTE — Telephone Encounter (Signed)
spoke with elderly man whom states that he will advise the pt of the appt change from 5/16 to  5/17(allow time for tx pt)    aom

## 2011-11-20 ENCOUNTER — Ambulatory Visit: Payer: Medicare Other | Admitting: Oncology

## 2011-11-20 ENCOUNTER — Other Ambulatory Visit: Payer: Medicare Other | Admitting: Lab

## 2011-11-21 ENCOUNTER — Telehealth: Payer: Self-pay | Admitting: Oncology

## 2011-11-21 ENCOUNTER — Ambulatory Visit (HOSPITAL_BASED_OUTPATIENT_CLINIC_OR_DEPARTMENT_OTHER): Payer: Medicare Other | Admitting: Oncology

## 2011-11-21 ENCOUNTER — Encounter: Payer: Self-pay | Admitting: Oncology

## 2011-11-21 ENCOUNTER — Other Ambulatory Visit (HOSPITAL_BASED_OUTPATIENT_CLINIC_OR_DEPARTMENT_OTHER): Payer: Medicare Other | Admitting: Lab

## 2011-11-21 VITALS — BP 136/77 | HR 79 | Temp 97.7°F | Ht 75.0 in | Wt 177.2 lb

## 2011-11-21 DIAGNOSIS — Z09 Encounter for follow-up examination after completed treatment for conditions other than malignant neoplasm: Secondary | ICD-10-CM

## 2011-11-21 DIAGNOSIS — C8211 Follicular lymphoma grade II, lymph nodes of head, face, and neck: Secondary | ICD-10-CM

## 2011-11-21 DIAGNOSIS — D509 Iron deficiency anemia, unspecified: Secondary | ICD-10-CM

## 2011-11-21 DIAGNOSIS — C8291 Follicular lymphoma, unspecified, lymph nodes of head, face, and neck: Secondary | ICD-10-CM

## 2011-11-21 DIAGNOSIS — Z950 Presence of cardiac pacemaker: Secondary | ICD-10-CM

## 2011-11-21 DIAGNOSIS — C8581 Other specified types of non-Hodgkin lymphoma, lymph nodes of head, face, and neck: Secondary | ICD-10-CM

## 2011-11-21 HISTORY — DX: Iron deficiency anemia, unspecified: D50.9

## 2011-11-21 LAB — COMPREHENSIVE METABOLIC PANEL
ALT: 13 U/L (ref 0–53)
Alkaline Phosphatase: 65 U/L (ref 39–117)
Sodium: 137 mEq/L (ref 135–145)
Total Bilirubin: 0.7 mg/dL (ref 0.3–1.2)
Total Protein: 6.1 g/dL (ref 6.0–8.3)

## 2011-11-21 LAB — IRON AND TIBC
%SAT: 21 % (ref 20–55)
Iron: 73 ug/dL (ref 42–165)

## 2011-11-21 LAB — CBC WITH DIFFERENTIAL/PLATELET
BASO%: 0.5 % (ref 0.0–2.0)
EOS%: 2.5 % (ref 0.0–7.0)
MCH: 28 pg (ref 27.2–33.4)
MCHC: 32.6 g/dL (ref 32.0–36.0)
MCV: 86 fL (ref 79.3–98.0)
MONO%: 8.8 % (ref 0.0–14.0)
NEUT#: 4.7 10*3/uL (ref 1.5–6.5)
RBC: 4.28 10*6/uL (ref 4.20–5.82)
RDW: 19.4 % — ABNORMAL HIGH (ref 11.0–14.6)
nRBC: 0 % (ref 0–0)

## 2011-11-21 LAB — FERRITIN: Ferritin: 36 ng/mL (ref 22–322)

## 2011-11-21 NOTE — Progress Notes (Signed)
CC:   Oneita Hurt, M.D. Kinnie Scales. Annalee Genta, M.D. Gloriajean Dell. Andrey Campanile, M.D. Michael L. Thad Ranger, M.D. Thereasa Solo. Little, M.D.  PROBLEM LIST: 1. Follicular lymphoma of the right tonsil, grade 2, stage II, CD20     positive, negative bone marrow.  Biopsy obtained on 03/03/2005.     The patient received 4 cycles of treatment with Rituxan, Cytoxan     and Decadron from 04/11/2005 through 06/02/2005 followed by     radiation treatments to the right tonsil and right supraclavicular     area 3780 cGy in 21 fractions from 07/10/2005 through 08/20/2005.     The patient then received maintenance Rituxan from 12/16/2005     through 10/06/2007 and has remained disease free. 2. Iron-deficiency anemia with stools that were Hemoccult negative in     October 2012, treated with oral iron. 3. Neuropathy with bilateral foot drop. 4. Hypertension. 5. Coronary artery disease status post coronary artery bypass grafting     in 1988. 6. Ischemic cardiomyopathy. 7. Paroxysmal atrial fibrillation. 8. Biventricular implantable cardioverter/defibrillator placed in     December 2011. 9. Status post repair of an infrarenal abdominal aortic aneurysm in     November 2010. 10.Neuropathy with bilateral foot drop. 11.Sigmoid diverticulosis. 12.History of acute myocardial infarction in 1986.  MEDICATIONS: 1. Aspirin 81 mg daily. 2. Plavix 75 mg daily. 3. Ferrous sulfate 325 mg daily.  This may have been started in late     2012. 4. Hydrochlorothiazide 12.5 mg daily. 5. Toprol-XL 25 mg daily. 6. Nitrostat 0.4 mg sublingually as needed. 7. Prilosec 20 mg daily. 8. K-Dur 20 mEq daily. 9. Zocor 40 mg at bedtime. 10.Diovan 325 mg daily.  HISTORY:  Dakota Liu was seen today for followup of his stage II follicular lymphoma of the right tonsil with diagnosis established in August 2006, i.e. almost 7 years ago.  The patient remains without evidence of disease following treatment with chemotherapy and  radiation. The patient was unable to receive vincristine because of his neuropathy. He was unable to receive Adriamycin because of his cardiomyopathy. Nevertheless, he has done well.  He is here today with his wife, Dakota Dandy.  He was last seen by [place] on 04/15/2011.  At that time, his hemoglobin was 9.6, hematocrit 29.7.  MCV and MCH had dropped.  The ferritin came back 13.  Stools were Hemoccult negative.  The patient was started on iron and his anemia has corrected. It will be recalled that he is on iron and Plavix and has a history of sigmoid diverticulosis.  The patient's condition has remained stable.  He has had no medical setbacks.  He has not been in the hospital since late March of 2012 when he was admitted for unstable angina, underwent a heart catheterization with percutaneous coronary angioplasty and cutting balloon atherectomy to the distal right coronary artery.  Dakota Liu will soon be 78 years old and has no symptoms to suggest recurrence of his lymphoma.  PHYSICAL EXAM:  General:  He is a frail elderly gentleman, very pleasant today.  He is in no acute distress.  Face is somewhat plethoric.  Vital signs:  Weight is 177.2 pounds, height 63 inches, body surface area 2.06 m square.  Blood pressure 136/77.  Other vital signs are normal.  HEENT: There is no scleral icterus.  Mouth and pharynx are benign.  Exam of his tonsils using a tongue depressor showed no obvious recurrence of disease in the oropharynx.  No adenopathy in the neck or other  lymph node areas. Lungs:  Clear.  Breath sounds are decreased.  Cardiac:  Regular rhythm without obvious systolic ejection murmur.  Abdomen:  Benign with no organomegaly, masses palpable.  Extremities:  Trace to 1+ edema of the lower legs and ankles.  He is wearing L-braces for his bilateral foot drop.  He uses a cane in his right hand.  He has fragile skin and some purpura.  Neurologic:  Exam is without focal findings.  LABORATORY DATA:   Today, white count 6.5, ANC 4.7, hemoglobin 12.0, hematocrit 36.8, platelets 170,000.  Red cell indices are normal as compared with 04/15/2011 when his hemoglobin was 9.6 and his red cell indices were decreased.  Chemistries and iron studies today are pending. Chemistries from 04/15/2011 were normal except for a potassium of 3.3. Albumin was 4.2, LDH 147.  Ferritin was 13 and iron saturation 7% on 04/15/2011.  IMAGING STUDIES: 1. CT angiogram of chest, abdomen and pelvis on 09/30/2010 showed no     evidence for thoracic aortic aneurysm dissection or other acute     findings.  There was a large hiatal hernia.  Diverticulosis was     present in the sigmoid without diverticulitis.  There was a mildly     enlarged prostate gland. 2. Chest x-ray, 2 view, from 09/30/2010 showed a hiatal hernia but no     acute findings.  IMPRESSION AND PLAN:  Dakota Liu continues to do extraordinarily well, remains disease free now approaching 7 years from the time of diagnosis of a stage II follicular lymphoma of the right tonsil.  The patient is getting along extraordinarily well considering the severity of his underlying cardiac disease.  I have suggested that he remain on iron 1 a day.  He probably has either intermittent GI bleeding or perhaps a low level GI blood loss.  I should mention that a 2D echocardiogram carried out on 10/14/2011 showed left ventricular systolic function  mildly to moderately reduced with a left ventricular ejection fraction of 40% to 45%.  This is markedly improved compared with the left ventricular ejection fraction of 30% noted in December 2011.  We will plan to see Dakota Liu again in 6 months at which time we will check CBC, chemistries, and iron studies.  We will also check a TSH in view of his neck radiation.    ______________________________ Samul Dada, M.D. DSM/MEDQ  D:  11/21/2011  T:  11/21/2011  Job:  [id number]

## 2011-11-21 NOTE — Progress Notes (Signed)
This office note has been dictated.  #[id number]

## 2011-11-21 NOTE — Telephone Encounter (Signed)
s/w pt and he req that we mail his appt to him,done   aom

## 2011-11-24 ENCOUNTER — Telehealth: Payer: Self-pay | Admitting: Medical Oncology

## 2011-11-24 NOTE — Telephone Encounter (Signed)
I called pt and spoke with wife Maryper Dr. Arline Asp to let him know that his ferritin is 36. Dr. Arline Asp would like for him to increase his Iron to twice a day. She voiced understanding.

## 2012-05-20 ENCOUNTER — Telehealth: Payer: Self-pay | Admitting: Oncology

## 2012-05-20 NOTE — Telephone Encounter (Signed)
Pt called and needed to change Friday appt....done

## 2012-05-20 NOTE — Telephone Encounter (Signed)
Pt called and needed to change Friday appt....done °

## 2012-05-20 NOTE — Telephone Encounter (Signed)
Returned call re r/s appt but was not able to reach pt line busy.

## 2012-05-21 ENCOUNTER — Ambulatory Visit: Payer: Medicare Other | Admitting: Family

## 2012-05-21 ENCOUNTER — Other Ambulatory Visit: Payer: Medicare Other | Admitting: Lab

## 2012-06-07 ENCOUNTER — Ambulatory Visit (HOSPITAL_BASED_OUTPATIENT_CLINIC_OR_DEPARTMENT_OTHER): Payer: Medicare Other | Admitting: Family

## 2012-06-07 ENCOUNTER — Encounter: Payer: Self-pay | Admitting: Family

## 2012-06-07 ENCOUNTER — Telehealth: Payer: Self-pay | Admitting: Oncology

## 2012-06-07 ENCOUNTER — Other Ambulatory Visit (HOSPITAL_BASED_OUTPATIENT_CLINIC_OR_DEPARTMENT_OTHER): Payer: Medicare Other | Admitting: Lab

## 2012-06-07 VITALS — BP 151/90 | HR 81 | Temp 98.0°F | Resp 18 | Ht 75.0 in | Wt 177.7 lb

## 2012-06-07 DIAGNOSIS — T66XXXA Radiation sickness, unspecified, initial encounter: Secondary | ICD-10-CM

## 2012-06-07 DIAGNOSIS — D509 Iron deficiency anemia, unspecified: Secondary | ICD-10-CM

## 2012-06-07 DIAGNOSIS — C8211 Follicular lymphoma grade II, lymph nodes of head, face, and neck: Secondary | ICD-10-CM

## 2012-06-07 DIAGNOSIS — M216X9 Other acquired deformities of unspecified foot: Secondary | ICD-10-CM

## 2012-06-07 DIAGNOSIS — C8291 Follicular lymphoma, unspecified, lymph nodes of head, face, and neck: Secondary | ICD-10-CM

## 2012-06-07 DIAGNOSIS — G5790 Unspecified mononeuropathy of unspecified lower limb: Secondary | ICD-10-CM

## 2012-06-07 LAB — COMPREHENSIVE METABOLIC PANEL (CC13)
ALT: 13 U/L (ref 0–55)
Alkaline Phosphatase: 79 U/L (ref 40–150)
CO2: 26 mEq/L (ref 22–29)
Sodium: 141 mEq/L (ref 136–145)
Total Bilirubin: 0.96 mg/dL (ref 0.20–1.20)
Total Protein: 6.2 g/dL — ABNORMAL LOW (ref 6.4–8.3)

## 2012-06-07 LAB — CBC WITH DIFFERENTIAL/PLATELET
Basophils Absolute: 0 10*3/uL (ref 0.0–0.1)
EOS%: 1.3 % (ref 0.0–7.0)
HCT: 40.6 % (ref 38.4–49.9)
HGB: 13.6 g/dL (ref 13.0–17.1)
MCH: 30.7 pg (ref 27.2–33.4)
MCV: 92 fL (ref 79.3–98.0)
MONO%: 8.1 % (ref 0.0–14.0)
NEUT%: 75.6 % — ABNORMAL HIGH (ref 39.0–75.0)
lymph#: 0.9 10*3/uL (ref 0.9–3.3)

## 2012-06-07 LAB — IRON AND TIBC: TIBC: 332 ug/dL (ref 215–435)

## 2012-06-07 LAB — LACTATE DEHYDROGENASE (CC13): LDH: 150 U/L (ref 125–245)

## 2012-06-07 LAB — TSH: TSH: 1.757 u[IU]/mL (ref 0.350–4.500)

## 2012-06-07 NOTE — Progress Notes (Signed)
Patient ID: Dakota Liu, male   DOB: 1928/02/21, 78 y.o.   MRN: [id number] CSN: [id number]  CC: Oneita Hurt, MD Kinnie Scales. Annalee Genta, MD Gloriajean Dell. Andrey Campanile, MD Marolyn Hammock. Thad Ranger, MD  Thurmon Fair, MD     Problem List: Dakota BOULTON is a 78 y.o. Caucasian male with a problem list consisting of:  1. Follicular lymphoma of the right tonsil, grade 2, stage II, CD20 positive, negative bone marrow. Biopsy obtained on 03/03/2005. The patient received 4 cycles of treatment with Rituxan, Cytoxan and Decadron from 04/11/2005 through 06/02/2005 followed by radiation treatments to the right tonsil and right supraclavicular area 3780 cGy in 21 fractions from 07/10/2005 through 08/20/2005. The patient then received maintenance Rituxan from 12/16/2005 through 10/06/2007 and has remained disease free.  2. Iron-deficiency anemia with stools that were Hemoccult negative in October 2012, treated with oral iron.  3. Neuropathy with bilateral foot drop 4. Hypertension 5. Coronary artery disease status post coronary artery bypass grafting in 1988.  6. Ischemic cardiomyopathy 7. Paroxysmal atrial fibrillation 8. Biventricular implantable cardioverter/defibrillator placed in December 2011.  9. Status post repair of an infrarenal abdominal aortic aneurysm in November 2010.  10.Neuropathy with bilateral foot drop  11.Sigmoid diverticulosis 12.History of acute myocardial infarction in 1986.  Dr. Arline Asp and I saw [name] today for follow up of his stage II follicular lymphoma of the right tonsil with diagnosis established in August 2006. He is here today with his wife, Dakota Liu.  He was last seen by [place] on 11/21/2011.  It will be recalled on 04/15/2011 his hemoglobin was 9.6, hematocrit 29.7, MCV and MCH dropped and Ferritin was at 13. Stools were Hemoccult negative. The patient was started on oral iron and his anemia has corrected.  It will be noted that [name] is on ASA and Plavix and has a  history of sigmoid diverticulosis.   [name] had a 2D echocardiogram carried out on 10/14/2011 which showed left ventricular systolic function mildly to moderately reduced with a left ventricular ejection fraction of 40% to 45%. This is markedly improved compared with the left ventricular ejection fraction of 30% noted in December 2011.   [name] remains without evidence of disease following treatment with chemotherapy and radiation. The patient was unable to receive vincristine because of his neuropathy. He was unable to receive Adriamycin because of his cardiomyopathy. Nevertheless, he has done well.  The patient's condition has remained stable. He has had no medical setbacks. He has not been in the hospital since late March of 2012 when he was admitted for unstable angina, underwent a heart catheterization with percutaneous coronary angioplasty and cutting balloon atherectomy to the distal right coronary artery. [name] is 78 years old and has no symptoms to suggest recurrence of his lymphoma.  He denies any symptomatology and states that he feels well.     Past Medical History: Past Medical History  Diagnosis Date  . HTN (hypertension)   . Dyslipidemia   . Lymphoma     with metal radiation about 2006  . Epididymitis   . PAF (paroxysmal atrial fibrillation)   . Anemia, iron deficiency 11/21/2011    Surgical History: Past Surgical History  Procedure Date  . Tonsillectomy   . Orchiectomy   . Coronary artery bypass graft   . Abdominal aortic aneurysm repair   . Cardiac defibrillator placement     Current Medications: Current Outpatient Prescriptions  Medication Sig Dispense Refill  . aspirin 81 MG tablet Take  81 mg by mouth daily.      . clopidogrel (PLAVIX) 75 MG tablet Take 75 mg by mouth daily.        . Ferrous Sulfate (IRON) 325 (65 FE) MG TABS Take 1 tablet by mouth daily.      . hydrochlorothiazide 25 MG tablet Take 12.5 mg by mouth daily.        . metoprolol  succinate (TOPROL-XL) 25 MG 24 hr tablet Take 25 mg by mouth daily.        . nitroGLYCERIN (NITROSTAT) 0.4 MG SL tablet Place 0.4 mg under the tongue every 5 (five) minutes as needed.        Marland Kitchen omeprazole (PRILOSEC) 20 MG capsule Take 20 mg by mouth daily.        . potassium chloride SA (K-DUR,KLOR-CON) 20 MEQ tablet Take 20 mEq by mouth daily.        . simvastatin (ZOCOR) 40 MG tablet Take 40 mg by mouth at bedtime.        . valsartan (DIOVAN) 320 MG tablet Take 320 mg by mouth daily.          Allergies: No Known Allergies  Family History: Family History  Problem Relation Age of Onset  . Diabetes Mother   . Heart Problems Mother   . Stroke Father   . Cancer Sister     Social History: History  Substance Use Topics  . Smoking status: Former Games developer  . Smokeless tobacco: Never Used  . Alcohol Use: No    Review of Systems: 10 Point review of systems was completed and is negative.   Physical Exam:   Blood pressure 151/90, pulse 81, temperature 98 F (36.7 C), temperature source Oral, resp. rate 18, height 6\' 3"  (1.905 m), weight 177 lb 11.2 oz (80.604 kg).  General appearance: Alert, cooperative, well nourished, no apparent distress Head: Normocephalic, without obvious abnormality, atraumatic Eyes: Conjunctivae/corneas clear. PERRLA, EOMI Nose: Nares, septum and mucosa are normal, no drainage or sinus tenderness Throat: Lips, mucosa, and tongue are normal, dentures, no obvious recurrence of disease in the oropharynx Neck: No adenopathy, supple, symmetrical, trachea midline, thyroid not enlarged, no tenderness Resp: Clear to auscultation bilaterally Cardio: Regular rate and rhythm, S1, S2 normal, no murmur, click, rub or gallop, left chest implanted defibrillator without signs of infection GI: Soft, distended, hiatal hernia, non-tender, normoactive bowel sounds, no organomegaly Extremities: Upper extremities normal, atraumatic, no cyanosis or edema, bilateral LE foot braces,  bilateral LE edema +1 Skin:  Diffuse erythema on head, trunk and extremities Lymph nodes: Cervical, supraclavicular, and axillary nodes normal Neurologic: Grossly normal   Laboratory Data: Results for orders placed in visit on 06/07/12 (from the past 48 hour(s))  CBC WITH DIFFERENTIAL     Status: Abnormal   Collection Time   06/07/12  9:23 AM      Component Value Range Comment   WBC 6.3  4.0 - 10.3 10e3/uL    NEUT# 4.7  1.5 - 6.5 10e3/uL    HGB 13.6  13.0 - 17.1 g/dL    HCT 16.1  09.6 - 04.5 %    Platelets 151  140 - 400 10e3/uL    MCV 92.0  79.3 - 98.0 fL    MCH 30.7  27.2 - 33.4 pg    MCHC 33.4  32.0 - 36.0 g/dL    RBC 4.09  8.11 - 9.14 10e6/uL    RDW 14.8 (*) 11.0 - 14.6 %    lymph# 0.9  0.9 -  3.3 10e3/uL    MONO# 0.5  0.1 - 0.9 10e3/uL    Eosinophils Absolute 0.1  0.0 - 0.5 10e3/uL    Basophils Absolute 0.0  0.0 - 0.1 10e3/uL    NEUT% 75.6 (*) 39.0 - 75.0 %    LYMPH% 14.6  14.0 - 49.0 %    MONO% 8.1  0.0 - 14.0 %    EOS% 1.3  0.0 - 7.0 %    BASO% 0.4  0.0 - 2.0 %   COMPREHENSIVE METABOLIC PANEL (CC13)     Status: Abnormal   Collection Time   06/07/12  9:23 AM      Component Value Range Comment   Sodium 141  136 - 145 mEq/L    Potassium 3.5  3.5 - 5.1 mEq/L    Chloride 105  98 - 107 mEq/L    CO2 26  22 - 29 mEq/L    Glucose 134 (*) 70 - 99 mg/dl    BUN 16.1  7.0 - 09.6 mg/dL    Creatinine 0.9  0.7 - 1.3 mg/dL    Total Bilirubin 0.45  0.20 - 1.20 mg/dL    Alkaline Phosphatase 79  40 - 150 U/L    AST 15  5 - 34 U/L    ALT 13  0 - 55 U/L    Total Protein 6.2 (*) 6.4 - 8.3 g/dL    Albumin 3.7  3.5 - 5.0 g/dL    Calcium 8.9  8.4 - 40.9 mg/dL   LACTATE DEHYDROGENASE (CC13)     Status: Normal   Collection Time   06/07/12  9:23 AM      Component Value Range Comment   LDH 150  125 - 245 U/L 05/13/12 - NOTE new reference range.     Imaging Studies: 1. CT angiogram of chest, abdomen and pelvis on 09/30/2010 showed no evidence for thoracic aortic aneurysm dissection or  other acute findings. There was a large hiatal hernia. Diverticulosis was present in the sigmoid without diverticulitis. There was a mildly enlarged prostate gland.  2. Chest x-ray, 2 view, from 09/30/2010 showed a hiatal hernia but no acute findings. 3. Chest x-ray 2 view on 03/24/2012 showed a hiatal hernia, no interval change and no evidence of congestive heart failure.     Impression/Plan: Mr. Freedland continues to do well and remains disease free 7 years from the time of diagnosis of a stage II follicular lymphoma of the right tonsil. He is also doing very well considering the severity of his underlying cardiac disease. Dr. Arline Asp suggested that he remain on iron 1 a day.  Mr. Pickron probably has either intermittent GI bleeding or perhaps a low level GI blood loss.    Dr. Arline Asp gave Mr. Darbyshire the option of continued follow up every 6 months with [place] or to see him on an as needed basis.  Dr. Arline Asp also explained to Mr. And Mrs. Strum that his lymphoma could reoccur and may not reoccur in the same location.  Mr. Papaleo opted to see [place] on an as needed basis and stated that he visits his PCP Dr. Andrey Campanile every 6 months.   We encouraged Mr. and Mrs. Huizar to contact us if they have any questions or concerns.  They verbalized understanding.   Larina Bras, NP-C 06/07/2012, 10:06 AM

## 2012-06-07 NOTE — Patient Instructions (Addendum)
Contact us if you have any questions or concerns.   Please have Dr. Andrey Campanile check a LDH level every 6 months when you visit him if possible.  Thank you.

## 2012-06-07 NOTE — Telephone Encounter (Signed)
Per 12/2 pof pt will f/u on an as needed basis.

## 2012-10-11 ENCOUNTER — Encounter: Payer: Self-pay | Admitting: *Deleted

## 2012-10-13 ENCOUNTER — Encounter: Payer: Self-pay | Admitting: Cardiovascular Disease

## 2013-01-11 ENCOUNTER — Emergency Department (HOSPITAL_COMMUNITY)
Admission: EM | Admit: 2013-01-11 | Discharge: 2013-01-12 | Disposition: A | Discharge status: Home / Self Care | Payer: Medicare Other | Attending: Emergency Medicine | Admitting: Emergency Medicine

## 2013-01-11 DIAGNOSIS — Z7982 Long term (current) use of aspirin: Secondary | ICD-10-CM | POA: Insufficient documentation

## 2013-01-11 DIAGNOSIS — M546 Pain in thoracic spine: Secondary | ICD-10-CM | POA: Insufficient documentation

## 2013-01-11 DIAGNOSIS — Z8679 Personal history of other diseases of the circulatory system: Secondary | ICD-10-CM | POA: Insufficient documentation

## 2013-01-11 DIAGNOSIS — Z951 Presence of aortocoronary bypass graft: Secondary | ICD-10-CM | POA: Insufficient documentation

## 2013-01-11 DIAGNOSIS — Z79899 Other long term (current) drug therapy: Secondary | ICD-10-CM | POA: Insufficient documentation

## 2013-01-11 DIAGNOSIS — Z87898 Personal history of other specified conditions: Secondary | ICD-10-CM | POA: Insufficient documentation

## 2013-01-11 DIAGNOSIS — E785 Hyperlipidemia, unspecified: Secondary | ICD-10-CM | POA: Insufficient documentation

## 2013-01-11 DIAGNOSIS — D509 Iron deficiency anemia, unspecified: Secondary | ICD-10-CM | POA: Insufficient documentation

## 2013-01-11 DIAGNOSIS — Z87891 Personal history of nicotine dependence: Secondary | ICD-10-CM | POA: Insufficient documentation

## 2013-01-11 DIAGNOSIS — I10 Essential (primary) hypertension: Secondary | ICD-10-CM | POA: Insufficient documentation

## 2013-01-11 DIAGNOSIS — M549 Dorsalgia, unspecified: Secondary | ICD-10-CM

## 2013-01-11 DIAGNOSIS — I509 Heart failure, unspecified: Secondary | ICD-10-CM | POA: Insufficient documentation

## 2013-01-11 DIAGNOSIS — R0789 Other chest pain: Secondary | ICD-10-CM | POA: Insufficient documentation

## 2013-01-11 DIAGNOSIS — I2510 Atherosclerotic heart disease of native coronary artery without angina pectoris: Secondary | ICD-10-CM | POA: Insufficient documentation

## 2013-01-11 DIAGNOSIS — Z87448 Personal history of other diseases of urinary system: Secondary | ICD-10-CM | POA: Insufficient documentation

## 2013-01-11 LAB — BASIC METABOLIC PANEL
Chloride: 97 mEq/L (ref 96–112)
GFR calc Af Amer: 88 mL/min — ABNORMAL LOW (ref >90)
GFR calc non Af Amer: 76 mL/min — ABNORMAL LOW (ref >90)
Potassium: 3.3 mEq/L — ABNORMAL LOW (ref 3.5–5.1)

## 2013-01-11 LAB — POCT I-STAT TROPONIN I: Troponin i, poc: 0 ng/mL (ref 0.00–0.08)

## 2013-01-11 LAB — PROTIME-INR: INR: 1.08 (ref 0.00–1.49)

## 2013-01-11 LAB — CBC
MCHC: 32.7 g/dL (ref 30.0–36.0)
Platelets: 157 10*3/uL (ref 150–400)
RDW: 15 % (ref 11.5–15.5)
WBC: 7.9 10*3/uL (ref 4.0–10.5)

## 2013-01-11 NOTE — ED Notes (Signed)
Rt. Sided chest pain, radiating to the back for last 2 days. Pt. Has an ICD and has not gone off yet.

## 2013-01-11 NOTE — ED Notes (Signed)
Pt reports that he stared having intermittent right sided stabbing back pain that started last night. Pt reports the pain has now radiated to right side of chest. States pain comes and goes. Denies any associated factors including N/V/SOB. Pt states "I felt SOB earlier, but I think I was just concerned."  Pt concerned about ICD firing, due to hx of two MIs in past. PT denying CP at this time.

## 2013-01-12 ENCOUNTER — Emergency Department (HOSPITAL_COMMUNITY): Payer: Medicare Other

## 2013-01-12 LAB — PRO B NATRIURETIC PEPTIDE: Pro B Natriuretic peptide (BNP): 1083 pg/mL — ABNORMAL HIGH (ref 0–450)

## 2013-01-12 MED ORDER — TRAMADOL HCL 50 MG PO TABS
50.0000 mg | ORAL_TABLET | Freq: Four times a day (QID) | ORAL | Status: DC | PRN
  Administered 2013-01-12: 50 mg via ORAL
  Filled 2013-01-12: qty 1

## 2013-01-12 MED ORDER — TRAMADOL HCL 50 MG PO TABS: 25.0000 mg | ORAL_TABLET | Freq: Four times a day (QID) | ORAL | Status: DC | PRN

## 2013-01-12 NOTE — ED Provider Notes (Signed)
History    CSN: [id number] Arrival date & time 01/11/13  2300  First MD Initiated Contact with Patient 01/11/13 2322     Chief Complaint  Patient presents with  . Chest Pain   (Consider location/radiation/quality/duration/timing/severity/associated sxs/prior Treatment) HPI 79 year old male presents to emergency department with complaint of right upper back pain and right anterior chest pain.  Pain started yesterday evening.  Pain lasts less than a minute at a time.  It is sharp.  It has been intermittent since onset.  He denies shortness of breath, no nausea no diaphoresis.  No cough.  Patient was at rest when the pain started.  No prior history of same.  Patient is concerned about his AICD/pacemaker.  Patient has history of 2 prior MIs.  Current pain is not like his prior MIs.  Patient reports he recently mopped the floor while sitting in a rolling chair as he is nonambulatory.  He is unsure if this activity caused his current pain.  Patient has been taking Naprosyn at home, which has alleviated the pain for hours at a time.  Patient has been told, however, he is not supposed to take Naprosyn as he is on aspirin and Plavix.  Past Medical History  Diagnosis Date  . HTN (hypertension)   . Dyslipidemia   . Lymphoma     with metal radiation about 2006  . Epididymitis   . PAF (paroxysmal atrial fibrillation)   . Anemia, iron deficiency 11/21/2011  . CAD (coronary artery disease)   . Ischemic cardiomyopathy     mod to severe EF 30%  . CHF (congestive heart failure)   . Atrial tachycardia    Past Surgical History  Procedure Laterality Date  . Tonsillectomy    . Orchiectomy    . Coronary artery bypass graft  1986  . Abdominal aortic aneurysm repair    . Cardiac defibrillator placement    . Coronary angioplasty with stent placement  07/05/10    RCA   Family History  Problem Relation Age of Onset  . Diabetes Mother   . Heart Problems Mother   . Stroke Father   . Cancer Sister     History  Substance Use Topics  . Smoking status: Former Games developer  . Smokeless tobacco: Never Used  . Alcohol Use: No    Review of Systems  See History of Present Illness; otherwise all other systems are reviewed and negative  Allergies  Simvastatin  Home Medications   Current Outpatient Rx  Name  Route  Sig  Dispense  Refill  . aspirin 81 MG tablet   Oral   Take 81 mg by mouth daily.         . clopidogrel (PLAVIX) 75 MG tablet   Oral   Take 75 mg by mouth daily.           . Ferrous Sulfate (IRON) 325 (65 FE) MG TABS   Oral   Take 1 tablet by mouth daily.         . furosemide (LASIX) 20 MG tablet   Oral   Take 20 mg by mouth daily.          . hydrochlorothiazide 25 MG tablet   Oral   Take 12.5-25 mg by mouth daily.          . metoprolol succinate (TOPROL-XL) 25 MG 24 hr tablet   Oral   Take 25 mg by mouth daily.           . nitroGLYCERIN (  NITROSTAT) 0.4 MG SL tablet   Sublingual   Place 0.4 mg under the tongue every 5 (five) minutes as needed.           Marland Kitchen omeprazole (PRILOSEC) 20 MG capsule   Oral   Take 20 mg by mouth daily.           . potassium chloride SA (K-DUR,KLOR-CON) 20 MEQ tablet   Oral   Take 20 mEq by mouth daily.           . simvastatin (ZOCOR) 40 MG tablet   Oral   Take 20 mg by mouth at bedtime.          . valsartan (DIOVAN) 320 MG tablet   Oral   Take 320 mg by mouth daily.            BP 151/66  Pulse 63  Temp(Src) 98.1 F (36.7 C) (Axillary)  Resp 17  SpO2 96% Physical Exam  Nursing note and vitals reviewed. Constitutional: He is oriented to person, place, and time. He appears well-developed and well-nourished.  HENT:  Head: Normocephalic and atraumatic.  Nose: Nose normal.  Mouth/Throat: Oropharynx is clear and moist.  Eyes: Conjunctivae and EOM are normal. Pupils are equal, round, and reactive to light.  Neck: Normal range of motion. Neck supple. No JVD present. No tracheal deviation present. No  thyromegaly present.  Cardiovascular: Normal rate, regular rhythm, normal heart sounds and intact distal pulses.  Exam reveals no gallop and no friction rub.   No murmur heard. Pulmonary/Chest: Effort normal and breath sounds normal. No stridor. No respiratory distress. He has no wheezes. He has no rales. He exhibits no tenderness.  Abdominal: Soft. Bowel sounds are normal. He exhibits no distension and no mass. There is no tenderness. There is no rebound and no guarding.  Musculoskeletal: Normal range of motion. He exhibits no edema and no tenderness.  Lymphadenopathy:    He has no cervical adenopathy.  Neurological: He is alert and oriented to person, place, and time. He exhibits normal muscle tone. Coordination normal.  Skin: Skin is warm and dry. No rash noted. No erythema. No pallor.  Psychiatric: He has a normal mood and affect. His behavior is normal. Judgment and thought content normal.    ED Course  Procedures (including critical care time) Labs Reviewed  CBC - Abnormal; Notable for the following:    RBC 4.08 (*)    Hemoglobin 11.5 (*)    HCT 35.2 (*)    All other components within normal limits  BASIC METABOLIC PANEL - Abnormal; Notable for the following:    Sodium 134 (*)    Potassium 3.3 (*)    Glucose, Bld 142 (*)    GFR calc non Af Amer 76 (*)    GFR calc Af Amer 88 (*)    All other components within normal limits  PRO B NATRIURETIC PEPTIDE - Abnormal; Notable for the following:    Pro B Natriuretic peptide (BNP) 1083.0 (*)    All other components within normal limits  PROTIME-INR  POCT I-STAT TROPONIN I   Dg Chest 2 View  01/12/2013   *RADIOLOGY REPORT*  Clinical Data: Chest pain  CHEST - 2 VIEW  Comparison: 03/24/2012  Findings: Left AICD remains in place, unchanged.  Scarring in the left base.  Right lung is clear.  No effusions.  No acute bony abnormality.  Heart is normal in size.  Small hiatal hernia.  IMPRESSION: Left basilar scarring.  Small hiatal hernia.  No  acute findings.   Original Report Authenticated By: Charlett Nose, M.D.    Date: 01/12/2013  Rate: 76  Rhythm: biventricular pacemaker  QRS Axis: left  Intervals: normal  ST/T Wave abnormalities: normal  Conduction Disutrbances:none  Narrative Interpretation:   Old EKG Reviewed: unchanged   1. Upper back pain   2. Chest pain, atypical     MDM  79 year old male with right back and chest pain.  It is atypical presentation for ACS.  His EKG shows paced rhythm.  Pain has been ongoing since yesterday evening.  It has been relieved by NSAIDs.  I do not feel that ACS or CAD as cause for pain.  No signs of pneumonia.  On chest x-ray.  There is no overlying skin changes to suggest shingles.  Suspect this is musculoskeletal in origin.  Given history of aspirin, and Plavix use, will switch to Ultram.  Will have him followup with his primary care Dr.  Olivia Mackie, MD 01/12/13 [phone]

## 2013-01-17 ENCOUNTER — Ambulatory Visit (INDEPENDENT_AMBULATORY_CARE_PROVIDER_SITE_OTHER): Payer: Medicare Other | Admitting: Physician Assistant

## 2013-01-17 ENCOUNTER — Encounter: Payer: Self-pay | Admitting: Physician Assistant

## 2013-01-17 VITALS — BP 126/78 | HR 88 | Ht 75.0 in | Wt 180.0 lb

## 2013-01-17 DIAGNOSIS — R531 Weakness: Secondary | ICD-10-CM

## 2013-01-17 DIAGNOSIS — R82998 Other abnormal findings in urine: Secondary | ICD-10-CM

## 2013-01-17 DIAGNOSIS — R5381 Other malaise: Secondary | ICD-10-CM

## 2013-01-17 DIAGNOSIS — R5383 Other fatigue: Secondary | ICD-10-CM

## 2013-01-17 DIAGNOSIS — I2589 Other forms of chronic ischemic heart disease: Secondary | ICD-10-CM

## 2013-01-17 DIAGNOSIS — I2510 Atherosclerotic heart disease of native coronary artery without angina pectoris: Secondary | ICD-10-CM

## 2013-01-17 NOTE — Patient Instructions (Signed)
Followup in 6 months with Dr. Royann Shivers

## 2013-01-17 NOTE — Assessment & Plan Note (Signed)
We will check a urinalysis with reflex microscopy after this appointment.

## 2013-01-17 NOTE — Progress Notes (Signed)
Date:  01/17/2013   ID:  Dakota Liu, DOB 1928/06/11, MRN [id number]  PCP:  Pamelia Hoit, MD  Primary Cardiologist:  Croitoru    History of Present Illness: Dakota Liu is a 79 y.o. male with a history of coronary artery disease, ischemic cardiomyopathy with severely depressed left ventricular systolic function, biventricular defibrillator, hypertension, hyperlipidemia and remote history of lymphoma. Patient wears braces on his ankles due to being unsteady on his feet.  Last 2-D echocardiogram was April 2013 showed an ejection fraction of 40-45% compared LV relaxation. Severe anterior wall hypokinesis. Moderate severe apical wall hypokinesis. Left atrium was moderately dilated. Right ventricular systolic pressure was 30-40 mm of mercury. Mild tricuspid regurgitation. Patient was seen on 01/11/2013 at the emergency room for chest pain that radiated to his back was sharp in nature. He was treated treated for musculoskeletal pain and provided with Toradol at discharge.  Chest x-ray showed no acute abnormalities. BNP was mildly elevated at ~1000.  The pain has subsequently resolved however, the patient reports feeling weak tired with decreased  appetite since the eighth. Also reports very dark urine since then as well.  Does report some episodes of waking up feeling sweaty and having to change his shirt.  He currently denies nausea, vomiting, fever, chest pain, shortness of breath, orthopnea, dizziness, PND, cough, congestion, abdominal pain, hematochezia, melena, lower extremity edema, claudication.  Wt Readings from Last 3 Encounters:  01/17/13 180 lb (81.647 kg)  06/07/12 177 lb 11.2 oz (80.604 kg)  11/21/11 177 lb 3.2 oz (80.377 kg)     Past Medical History  Diagnosis Date  . HTN (hypertension)   . Dyslipidemia   . Lymphoma     with metal radiation about 2006  . Epididymitis   . PAF (paroxysmal atrial fibrillation)   . Anemia, iron deficiency 11/21/2011  . CAD (coronary  artery disease)   . Ischemic cardiomyopathy     mod to severe EF 30%  . CHF (congestive heart failure)   . Atrial tachycardia     Current Outpatient Prescriptions  Medication Sig Dispense Refill  . aspirin 81 MG tablet Take 81 mg by mouth daily.      . clopidogrel (PLAVIX) 75 MG tablet Take 75 mg by mouth daily.        . furosemide (LASIX) 20 MG tablet Take 20 mg by mouth daily.       . hydrochlorothiazide 25 MG tablet Take 12.5-25 mg by mouth daily.       . metoprolol succinate (TOPROL-XL) 25 MG 24 hr tablet Take 25 mg by mouth daily.        . nitroGLYCERIN (NITROSTAT) 0.4 MG SL tablet Place 0.4 mg under the tongue every 5 (five) minutes as needed.        Marland Kitchen omeprazole (PRILOSEC) 20 MG capsule Take 20 mg by mouth daily.        . potassium chloride SA (K-DUR,KLOR-CON) 20 MEQ tablet Take 20 mEq by mouth daily.        . simvastatin (ZOCOR) 40 MG tablet Take 20 mg by mouth at bedtime.       . valsartan (DIOVAN) 320 MG tablet Take 320 mg by mouth daily.         No current facility-administered medications for this visit.    Allergies:    Allergies  Allergen Reactions  . Tramadol Nausea And Vomiting and Other (See Comments)    Dry heaving, GI upset  . Simvastatin  myalgia    Social History:  The patient  reports that he quit smoking about 28 years ago. His smoking use included Cigarettes. He has a 44 pack-year smoking history. He has never used smokeless tobacco. He reports that he does not drink alcohol or use illicit drugs.   Family history:   Family History  Problem Relation Age of Onset  . Diabetes Mother   . Heart Problems Mother   . Stroke Father   . Cancer Sister     ROS:  Please see the history of present illness.  All other systems reviewed and negative.   PHYSICAL EXAM: VS:  BP 126/78  Pulse 88  Ht 6\' 3"  (1.905 m)  Wt 180 lb (81.647 kg)  BMI 22.5 kg/m2 Well nourished, well developed, in no acute distress HEENT: Pupils are equal round react to light  accommodation extraocular movements are intact.  Neck: no JVDNo cervical lymphadenopathy. Cardiac: Regular rate and rhythm without murmurs rubs or gallops. Lungs:  clear to auscultation bilaterally, no wheezing, rhonchi or rales Abd: soft, nontender, no suprapubic tenderness, positive bowel sounds all quadrants, no hepatosplenomegaly Ext: no lower extremity edema.  2+ radial and dorsalis pedis pulses. Skin: warm and dry Neuro:  Grossly normal  Lab Results  Component Value Date   WBC 7.9 01/11/2013   HGB 11.5* 01/11/2013   HCT 35.2* 01/11/2013   MCV 86.3 01/11/2013   PLT 157 01/11/2013     Chemistry      Component Value Date/Time   NA 134* 01/11/2013 2305   NA 141 06/07/2012 0923   K 3.3* 01/11/2013 2305   K 3.5 06/07/2012 0923   CL 97 01/11/2013 2305   CL 105 06/07/2012 0923   CO2 30 01/11/2013 2305   CO2 26 06/07/2012 0923   BUN 14 01/11/2013 2305   BUN 14.0 06/07/2012 0923   CREATININE 0.93 01/11/2013 2305   CREATININE 0.9 06/07/2012 0923      Component Value Date/Time   CALCIUM 8.8 01/11/2013 2305   CALCIUM 8.9 06/07/2012 0923   ALKPHOS 79 06/07/2012 0923   ALKPHOS 65 11/21/2011 1428   AST 15 06/07/2012 0923   AST 18 11/21/2011 1428   ALT 13 06/07/2012 0923   ALT 13 11/21/2011 1428   BILITOT 0.96 06/07/2012 0923   BILITOT 0.7 11/21/2011 1428    \ EKG:  A sensed V. Pace. Rate 88 beats per minute    ASSESSMENT AND PLAN:  Problem List Items Addressed This Visit   Generalized weakness   Dark urine - Primary     We will check a urinalysis with reflex microscopy after this appointment.    Relevant Orders      Urinalysis, Routine w reflex microscopic    Other Visit Diagnoses   Weakness generalized        Relevant Orders       EKG 12-Lead

## 2013-01-18 ENCOUNTER — Other Ambulatory Visit: Payer: Self-pay | Admitting: Physician Assistant

## 2013-01-18 DIAGNOSIS — R531 Weakness: Secondary | ICD-10-CM

## 2013-01-18 LAB — URINALYSIS, MICROSCOPIC ONLY
Bacteria, UA: NONE SEEN
Crystals: NONE SEEN

## 2013-01-18 LAB — URINALYSIS, ROUTINE W REFLEX MICROSCOPIC
Glucose, UA: NEGATIVE mg/dL
Ketones, ur: NEGATIVE mg/dL
Protein, ur: NEGATIVE mg/dL
Specific Gravity, Urine: 1.021 (ref 1.005–1.030)
Urobilinogen, UA: 4 mg/dL — ABNORMAL HIGH (ref 0.0–1.0)

## 2013-01-21 ENCOUNTER — Telehealth: Payer: Self-pay | Admitting: Physician Assistant

## 2013-01-21 NOTE — Telephone Encounter (Signed)
I spoke to the patient's wife regarding additional labs.  She also added she forgot to tell me about some tick bites that he had recently.  PCP started Abx.  Labrandon Knoch 10:42 AM

## 2013-04-06 DIAGNOSIS — I255 Ischemic cardiomyopathy: Secondary | ICD-10-CM

## 2013-04-06 HISTORY — DX: Ischemic cardiomyopathy: I25.5

## 2013-04-19 ENCOUNTER — Other Ambulatory Visit: Payer: Self-pay | Admitting: Cardiology

## 2013-04-19 NOTE — Telephone Encounter (Signed)
Rx was sent to pharmacy electronically. 

## 2013-04-26 ENCOUNTER — Encounter: Payer: Self-pay | Admitting: Cardiovascular Disease

## 2013-04-26 ENCOUNTER — Ambulatory Visit (INDEPENDENT_AMBULATORY_CARE_PROVIDER_SITE_OTHER): Payer: Medicare Other | Admitting: Cardiovascular Disease

## 2013-04-26 VITALS — BP 112/62 | HR 92 | Ht 74.0 in | Wt 175.2 lb

## 2013-04-26 DIAGNOSIS — I255 Ischemic cardiomyopathy: Secondary | ICD-10-CM

## 2013-04-26 DIAGNOSIS — I4891 Unspecified atrial fibrillation: Secondary | ICD-10-CM

## 2013-04-26 DIAGNOSIS — Z95810 Presence of automatic (implantable) cardiac defibrillator: Secondary | ICD-10-CM

## 2013-04-26 DIAGNOSIS — I2589 Other forms of chronic ischemic heart disease: Secondary | ICD-10-CM

## 2013-04-26 DIAGNOSIS — I509 Heart failure, unspecified: Secondary | ICD-10-CM

## 2013-04-26 DIAGNOSIS — I2510 Atherosclerotic heart disease of native coronary artery without angina pectoris: Secondary | ICD-10-CM

## 2013-04-26 DIAGNOSIS — I5022 Chronic systolic (congestive) heart failure: Secondary | ICD-10-CM

## 2013-04-26 LAB — ICD DEVICE OBSERVATION

## 2013-04-26 NOTE — Patient Instructions (Signed)
Dr. Royann Shivers  recommends that you schedule a follow-up appointment in: 6 months for an office visit and pacemaker check.

## 2013-05-01 ENCOUNTER — Observation Stay (HOSPITAL_COMMUNITY)
Admission: EM | Admit: 2013-05-01 | Discharge: 2013-05-04 | Disposition: A | Discharge status: Home / Self Care | Payer: Medicare Other | Attending: Internal Medicine | Admitting: Internal Medicine

## 2013-05-01 ENCOUNTER — Emergency Department (HOSPITAL_COMMUNITY): Payer: Medicare Other

## 2013-05-01 ENCOUNTER — Encounter (HOSPITAL_COMMUNITY): Payer: Self-pay | Admitting: Emergency Medicine

## 2013-05-01 DIAGNOSIS — D509 Iron deficiency anemia, unspecified: Secondary | ICD-10-CM | POA: Diagnosis present

## 2013-05-01 DIAGNOSIS — Z950 Presence of cardiac pacemaker: Secondary | ICD-10-CM

## 2013-05-01 DIAGNOSIS — I25810 Atherosclerosis of coronary artery bypass graft(s) without angina pectoris: Secondary | ICD-10-CM

## 2013-05-01 DIAGNOSIS — I447 Left bundle-branch block, unspecified: Secondary | ICD-10-CM | POA: Diagnosis present

## 2013-05-01 DIAGNOSIS — I10 Essential (primary) hypertension: Secondary | ICD-10-CM | POA: Insufficient documentation

## 2013-05-01 DIAGNOSIS — R0789 Other chest pain: Secondary | ICD-10-CM

## 2013-05-01 DIAGNOSIS — I2589 Other forms of chronic ischemic heart disease: Secondary | ICD-10-CM | POA: Insufficient documentation

## 2013-05-01 DIAGNOSIS — I472 Ventricular tachycardia, unspecified: Secondary | ICD-10-CM | POA: Diagnosis not present

## 2013-05-01 DIAGNOSIS — I5022 Chronic systolic (congestive) heart failure: Secondary | ICD-10-CM

## 2013-05-01 DIAGNOSIS — I480 Paroxysmal atrial fibrillation: Secondary | ICD-10-CM | POA: Diagnosis present

## 2013-05-01 DIAGNOSIS — Z95810 Presence of automatic (implantable) cardiac defibrillator: Secondary | ICD-10-CM | POA: Diagnosis present

## 2013-05-01 DIAGNOSIS — I739 Peripheral vascular disease, unspecified: Secondary | ICD-10-CM | POA: Diagnosis present

## 2013-05-01 DIAGNOSIS — I255 Ischemic cardiomyopathy: Secondary | ICD-10-CM | POA: Diagnosis present

## 2013-05-01 DIAGNOSIS — I509 Heart failure, unspecified: Secondary | ICD-10-CM

## 2013-05-01 DIAGNOSIS — R079 Chest pain, unspecified: Secondary | ICD-10-CM | POA: Insufficient documentation

## 2013-05-01 DIAGNOSIS — I5023 Acute on chronic systolic (congestive) heart failure: Secondary | ICD-10-CM | POA: Diagnosis present

## 2013-05-01 DIAGNOSIS — E876 Hypokalemia: Secondary | ICD-10-CM | POA: Insufficient documentation

## 2013-05-01 DIAGNOSIS — E785 Hyperlipidemia, unspecified: Secondary | ICD-10-CM | POA: Diagnosis present

## 2013-05-01 DIAGNOSIS — I079 Rheumatic tricuspid valve disease, unspecified: Secondary | ICD-10-CM | POA: Insufficient documentation

## 2013-05-01 DIAGNOSIS — C8211 Follicular lymphoma grade II, lymph nodes of head, face, and neck: Secondary | ICD-10-CM | POA: Diagnosis present

## 2013-05-01 DIAGNOSIS — I4891 Unspecified atrial fibrillation: Secondary | ICD-10-CM | POA: Insufficient documentation

## 2013-05-01 DIAGNOSIS — I2510 Atherosclerotic heart disease of native coronary artery without angina pectoris: Principal | ICD-10-CM | POA: Diagnosis present

## 2013-05-01 DIAGNOSIS — I200 Unstable angina: Secondary | ICD-10-CM | POA: Diagnosis present

## 2013-05-01 HISTORY — DX: Ventricular tachycardia: I47.2

## 2013-05-01 HISTORY — DX: Left bundle-branch block, unspecified: I44.7

## 2013-05-01 HISTORY — DX: Peripheral vascular disease, unspecified: I73.9

## 2013-05-01 LAB — BASIC METABOLIC PANEL
Calcium: 9.2 mg/dL (ref 8.4–10.5)
GFR calc Af Amer: 88 mL/min — ABNORMAL LOW (ref >90)
GFR calc non Af Amer: 76 mL/min — ABNORMAL LOW (ref >90)
Potassium: 3.3 mEq/L — ABNORMAL LOW (ref 3.5–5.1)
Sodium: 134 mEq/L — ABNORMAL LOW (ref 135–145)

## 2013-05-01 LAB — POCT I-STAT TROPONIN I: Troponin i, poc: 0 ng/mL (ref 0.00–0.08)

## 2013-05-01 LAB — CBC
Hemoglobin: 11.5 g/dL — ABNORMAL LOW (ref 13.0–17.0)
MCH: 27.4 pg (ref 26.0–34.0)
MCHC: 32.8 g/dL (ref 30.0–36.0)
Platelets: 189 10*3/uL (ref 150–400)

## 2013-05-01 LAB — PRO B NATRIURETIC PEPTIDE: Pro B Natriuretic peptide (BNP): 1064 pg/mL — ABNORMAL HIGH (ref 0–450)

## 2013-05-01 MED ORDER — NITROGLYCERIN 0.4 MG SL SUBL
0.4000 mg | SUBLINGUAL_TABLET | SUBLINGUAL | Status: DC | PRN
  Administered 2013-05-01 (×2): 0.4 mg via SUBLINGUAL
  Filled 2013-05-01: qty 25

## 2013-05-01 MED ORDER — ONDANSETRON HCL 4 MG/2ML IJ SOLN: 4.0000 mg | Freq: Once | INTRAMUSCULAR | Status: DC

## 2013-05-01 MED ORDER — NITROGLYCERIN 2 % TD OINT
0.5000 [in_us] | TOPICAL_OINTMENT | Freq: Four times a day (QID) | TRANSDERMAL | Status: DC
  Administered 2013-05-02 – 2013-05-04 (×7): 0.5 [in_us] via TOPICAL
  Filled 2013-05-01: qty 30
  Filled 2013-05-01: qty 1
  Filled 2013-05-01: qty 30

## 2013-05-01 MED ORDER — ONDANSETRON HCL 4 MG/2ML IJ SOLN
4.0000 mg | Freq: Once | INTRAMUSCULAR | Status: AC
  Administered 2013-05-01: 4 mg via INTRAVENOUS

## 2013-05-01 NOTE — ED Provider Notes (Signed)
CSN: [id number]     Arrival date & time 05/01/13  2022 History   First MD Initiated Contact with Patient 05/01/13 2035     Chief Complaint  Patient presents with  . Chest Pain   (Consider location/radiation/quality/duration/timing/severity/associated sxs/prior Treatment) The history is provided by the patient and medical records.   This is an 79 year old male with past history significant for hypertension, hyperlipidemia, paroxysmal A. fib, CAD, CHF, presenting to the ED for chest pain. Pain localized to bilateral lower ribs, described as a "tightness", with some radiation to his back.  Denies any SOB, diaphoresis, palpitations, weakness, numbness/paresthesias of extremities.  Pt is followed by Dr. Royann Shivers.  Prior stenting with pacemaker and defibrillator placement.  No recent illness, fevers, sweats, or chills.  No abdominal pain, nausea, vomiting, or diarrhea.  VS stable on arrival.  Past Medical History  Diagnosis Date  . HTN (hypertension)   . Dyslipidemia   . Lymphoma     with metal radiation about 2006  . Epididymitis   . PAF (paroxysmal atrial fibrillation)   . Anemia, iron deficiency 11/21/2011  . CAD (coronary artery disease)   . Ischemic cardiomyopathy     mod to severe EF 30%  . CHF (congestive heart failure)   . Atrial tachycardia    Past Surgical History  Procedure Laterality Date  . Tonsillectomy    . Orchiectomy    . Coronary artery bypass graft  1986  . Abdominal aortic aneurysm repair    . Cardiac defibrillator placement    . Coronary angioplasty with stent placement  07/05/10    RCA   Family History  Problem Relation Age of Onset  . Diabetes Mother   . Heart Problems Mother   . Stroke Father   . Cancer Sister    History  Substance Use Topics  . Smoking status: Former Smoker -- 1.00 packs/day for 44 years    Types: Cigarettes    Quit date: 07/07/1984  . Smokeless tobacco: Never Used  . Alcohol Use: No    Review of Systems  Respiratory: Positive  for chest tightness.   Cardiovascular: Positive for chest pain.  All other systems reviewed and are negative.    Allergies  Tramadol and Simvastatin  Home Medications   Current Outpatient Rx  Name  Route  Sig  Dispense  Refill  . aspirin 81 MG tablet   Oral   Take 81 mg by mouth daily.         . clopidogrel (PLAVIX) 75 MG tablet   Oral   Take 75 mg by mouth daily.           . furosemide (LASIX) 20 MG tablet   Oral   Take 20 mg by mouth daily.          . metoprolol succinate (TOPROL-XL) 25 MG 24 hr tablet   Oral   Take 25 mg by mouth daily.           Marland Kitchen omeprazole (PRILOSEC) 20 MG capsule   Oral   Take 20 mg by mouth daily.           . potassium chloride SA (K-DUR,KLOR-CON) 20 MEQ tablet      TAKE 1 TABLET BY MOUTH EVERY DAY   90 tablet   3   . simvastatin (ZOCOR) 40 MG tablet   Oral   Take 20 mg by mouth at bedtime.          . valsartan-hydrochlorothiazide (DIOVAN-HCT) 320-12.5 MG per tablet  Oral   Take 1 tablet by mouth daily.         . nitroGLYCERIN (NITROSTAT) 0.4 MG SL tablet   Sublingual   Place 0.4 mg under the tongue every 5 (five) minutes as needed.            BP 144/73  Pulse 79  Temp(Src) 97.6 F (36.4 C) (Oral)  Resp 18  SpO2 96%  Physical Exam  Nursing note and vitals reviewed. Constitutional: He is oriented to person, place, and time. He appears well-developed and well-nourished. No distress.  HENT:  Head: Normocephalic and atraumatic.  Mouth/Throat: Oropharynx is clear and moist.  Eyes: Conjunctivae and EOM are normal. Pupils are equal, round, and reactive to light.  Neck: Normal range of motion. Neck supple.  Cardiovascular: Normal rate, regular rhythm and normal heart sounds.   Pulses intact UE and LE bilaterally  Pulmonary/Chest: Effort normal and breath sounds normal. No respiratory distress. He has no wheezes.  Abdominal: Soft. Bowel sounds are normal. There is no tenderness. There is no guarding.   Musculoskeletal: Normal range of motion. He exhibits no edema.  Neurological: He is alert and oriented to person, place, and time.  Skin: Skin is warm and dry. He is not diaphoretic.  Psychiatric: He has a normal mood and affect.    ED Course  Procedures (including critical care time) Labs Review Labs Reviewed  CBC - Abnormal; Notable for the following:    WBC 11.3 (*)    RBC 4.20 (*)    Hemoglobin 11.5 (*)    HCT 35.1 (*)    All other components within normal limits  BASIC METABOLIC PANEL - Abnormal; Notable for the following:    Sodium 134 (*)    Potassium 3.3 (*)    Chloride 95 (*)    Glucose, Bld 176 (*)    GFR calc non Af Amer 76 (*)    GFR calc Af Amer 88 (*)    All other components within normal limits  PRO B NATRIURETIC PEPTIDE - Abnormal; Notable for the following:    Pro B Natriuretic peptide (BNP) 1064.0 (*)    All other components within normal limits  POCT I-STAT TROPONIN I   Imaging Review Dg Chest 2 View  05/01/2013   CLINICAL DATA:  Chest pain.  EXAM: CHEST  2 VIEW  COMPARISON:  January 12, 2013.  FINDINGS: Status post coronary artery bypass graft. Left-sided pacemaker is noted and unchanged. Moderate left hiatal hernia is noted. Minimal left pleural effusion is noted. No pneumothorax seen. Curvilinear density is noted in right lung base consistent with subsegmental atelectasis or less likely pneumonia.  IMPRESSION: Moderate left hiatal hernia. Curvilinear density seen in right lung base consistent with subsegmental atelectasis or possibly pneumonia.   Electronically Signed   By: Roque Lias M.D.   On: 05/01/2013 21:30    EKG Interpretation     Ventricular Rate:  81 PR Interval:  132 QRS Duration: 132 QT Interval:  390 QTC Calculation: 453 R Axis:   150 Text Interpretation:  Atrial-sensed ventricular-paced rhythm Biventricular pacemaker detected No significant change since last tracing            MDM   1. Chest pain   2. Chronic systolic  congestive heart failure   3. HTN (hypertension)   4. pacemaker in place   5. Cardiac defibrillator in place    EKG paced rhythm.  Initial trop negative. CXR as above-- pt has no other signs/sx of pneumonia, likely atelectasis.  BNP 1064.  Chest pain improved with NTG.  Given pts hx, will admit for CP r/o.  Discussed with hospitalist, Dr. Onalee Hua, pt will be admitted to Telemetry, team 10.  Temp admit orders placed.  VS stable.  Garlon Hatchet, PA-C 05/01/13 2302

## 2013-05-01 NOTE — ED Notes (Signed)
Pt reports intermittent "chest pain", sob, and vomiting since 4pm.  Points to bilateral sides when asked where pain is located.  Pain radiates to back.

## 2013-05-01 NOTE — H&P (Signed)
PCP:   Pamelia Hoit, MD   Chief Complaint:  cp  HPI: 79 yo male h/o cabd in 1986, AAA repair, pafib, icm ef 30% comes in with chest pain that radiated across right and left lower part of chest relieved with 2 ntg sl tablets.  No n/v/d.  No fevers.  No sob or diaphoresis with this.   He does not normally have angina, has not had to take a ntg pill at home in over 6 months.  No coughing.  No abd pain.  No le edema or swelling.  He is cp free now.  He has been feeling well the last several days, no illnessess noted.  Review of Systems:  Positive and negative as per HPI otherwise all other systems are negative  Past Medical History: Past Medical History  Diagnosis Date  . HTN (hypertension)   . Dyslipidemia   . Lymphoma     with metal radiation about 2006  . Epididymitis   . PAF (paroxysmal atrial fibrillation)   . Anemia, iron deficiency 11/21/2011  . CAD (coronary artery disease)   . Ischemic cardiomyopathy     mod to severe EF 30%  . CHF (congestive heart failure)   . Atrial tachycardia    Past Surgical History  Procedure Laterality Date  . Tonsillectomy    . Orchiectomy    . Coronary artery bypass graft  1986  . Abdominal aortic aneurysm repair    . Cardiac defibrillator placement    . Coronary angioplasty with stent placement  07/05/10    RCA    Medications: Prior to Admission medications   Medication Sig Start Date End Date Taking? Authorizing Provider  aspirin 81 MG tablet Take 81 mg by mouth daily.   Yes Historical Provider, MD  clopidogrel (PLAVIX) 75 MG tablet Take 75 mg by mouth daily.     Yes Historical Provider, MD  furosemide (LASIX) 20 MG tablet Take 20 mg by mouth daily.    Yes Historical Provider, MD  metoprolol succinate (TOPROL-XL) 25 MG 24 hr tablet Take 25 mg by mouth daily.     Yes Historical Provider, MD  omeprazole (PRILOSEC) 20 MG capsule Take 20 mg by mouth daily.     Yes Historical Provider, MD  potassium chloride SA (K-DUR,KLOR-CON) 20 MEQ  tablet TAKE 1 TABLET BY MOUTH EVERY DAY 04/19/13  Yes Marykay Lex, MD  simvastatin (ZOCOR) 40 MG tablet Take 20 mg by mouth at bedtime.    Yes Historical Provider, MD  valsartan-hydrochlorothiazide (DIOVAN-HCT) 320-12.5 MG per tablet Take 1 tablet by mouth daily. 03/07/13  Yes Historical Provider, MD  nitroGLYCERIN (NITROSTAT) 0.4 MG SL tablet Place 0.4 mg under the tongue every 5 (five) minutes as needed.      Historical Provider, MD    Allergies:   Allergies  Allergen Reactions  . Tramadol Nausea And Vomiting and Other (See Comments)    Dry heaving, GI upset  . Simvastatin     Myalgia- high doses    Social History:  reports that he quit smoking about 28 years ago. His smoking use included Cigarettes. He has a 44 pack-year smoking history. He has never used smokeless tobacco. He reports that he does not drink alcohol or use illicit drugs.  Family History: Family History  Problem Relation Age of Onset  . Diabetes Mother   . Heart Problems Mother   . Stroke Father   . Cancer Sister     Physical Exam: Filed Vitals:   05/01/13 2032  BP: 144/73  Pulse: 79  Temp: 97.6 F (36.4 C)  TempSrc: Oral  Resp: 18  SpO2: 96%   General appearance: alert, cooperative and no distress Head: Normocephalic, without obvious abnormality, atraumatic Eyes: negative Nose: Nares normal. Septum midline. Mucosa normal. No drainage or sinus tenderness. Neck: no JVD and supple, symmetrical, trachea midline Lungs: clear to auscultation bilaterally Heart: regular rate and rhythm, S1, S2 normal, no murmur, click, rub or gallop Abdomen: soft, non-tender; bowel sounds normal; no masses,  no organomegaly Extremities: extremities normal, atraumatic, no cyanosis or edema Pulses: 2+ and symmetric Skin: Skin color, texture, turgor normal. No rashes or lesions Neurologic: Grossly normal  Labs on Admission:   Recent Labs  05/01/13 2040  NA 134*  K 3.3*  CL 95*  CO2 26  GLUCOSE 176*  BUN 14   CREATININE 0.91  CALCIUM 9.2    Recent Labs  05/01/13 2040  WBC 11.3*  HGB 11.5*  HCT 35.1*  MCV 83.6  PLT 189   Radiological Exams on Admission: Dg Chest 2 View  05/01/2013   CLINICAL DATA:  Chest pain.  EXAM: CHEST  2 VIEW  COMPARISON:  January 12, 2013.  FINDINGS: Status post coronary artery bypass graft. Left-sided pacemaker is noted and unchanged. Moderate left hiatal hernia is noted. Minimal left pleural effusion is noted. No pneumothorax seen. Curvilinear density is noted in right lung base consistent with subsegmental atelectasis or less likely pneumonia.  IMPRESSION: Moderate left hiatal hernia. Curvilinear density seen in right lung base consistent with subsegmental atelectasis or possibly pneumonia.   Electronically Signed   By: Roque Lias M.D.   On: 05/01/2013 21:30    Assessment/Plan  79 yo male with multiple risk factors comes in with atypical chest pain symptoms relieved with ntg  Principal Problem:   Chest pain, atypical- cp free now.  Cont ntg paste for now.  Romi.  Ck 2 D echo in am.  ekg paced.  chf compensated.    Active Problems:   Ischemic cardiomyopathy   Chronic systolic congestive heart failure   Atrial fibrillation   Automatic implantable cardiac defibrillator in situ   Anemia, iron deficiency    Juliett Eastburn A 05/01/2013, 10:57 PM

## 2013-05-02 ENCOUNTER — Encounter (HOSPITAL_COMMUNITY): Payer: Self-pay | Admitting: *Deleted

## 2013-05-02 DIAGNOSIS — R0789 Other chest pain: Secondary | ICD-10-CM

## 2013-05-02 DIAGNOSIS — I25810 Atherosclerosis of coronary artery bypass graft(s) without angina pectoris: Secondary | ICD-10-CM

## 2013-05-02 DIAGNOSIS — R079 Chest pain, unspecified: Secondary | ICD-10-CM

## 2013-05-02 DIAGNOSIS — I379 Nonrheumatic pulmonary valve disorder, unspecified: Secondary | ICD-10-CM

## 2013-05-02 LAB — ICD DEVICE OBSERVATION
BAMS-0001: 171 {beats}/min
BATTERY VOLTAGE: 3.07 V
FVT: 0
PACEART VT: 0
RV LEAD AMPLITUDE: 20 mv
RV LEAD THRESHOLD: 2.25 V
TZAT-0002ATACH: NEGATIVE
TZAT-0002ATACH: NEGATIVE
TZAT-0002ATACH: NEGATIVE
TZAT-0004SLOWVT: 8
TZAT-0004SLOWVT: 8
TZAT-0005SLOWVT: 91 pct
TZAT-0011SLOWVT: 10 ms
TZAT-0011SLOWVT: 10 ms
TZAT-0012ATACH: 150 ms
TZAT-0012ATACH: 150 ms
TZAT-0012ATACH: 150 ms
TZAT-0012FASTVT: 170 ms
TZAT-0012SLOWVT: 170 ms
TZAT-0012SLOWVT: 170 ms
TZAT-0013SLOWVT: 2
TZAT-0018ATACH: NEGATIVE
TZAT-0018FASTVT: NEGATIVE
TZAT-0019ATACH: 6 V
TZAT-0019ATACH: 6 V
TZAT-0019SLOWVT: 8 V
TZAT-0019SLOWVT: 8 V
TZAT-0020ATACH: 1.5 ms
TZAT-0020ATACH: 1.5 ms
TZAT-0020ATACH: 1.5 ms
TZAT-0020FASTVT: 1.5 ms
TZAT-0020SLOWVT: 1.5 ms
TZON-0003ATACH: 350 ms
TZON-0003SLOWVT: 350 ms
TZON-0003VSLOWVT: 400 ms
TZON-0005SLOWVT: 12
TZST-0001ATACH: 4
TZST-0001FASTVT: 2
TZST-0001FASTVT: 3
TZST-0001FASTVT: 6
TZST-0001SLOWVT: 4
TZST-0001SLOWVT: 5
TZST-0002ATACH: NEGATIVE
TZST-0002FASTVT: NEGATIVE
TZST-0002FASTVT: NEGATIVE
TZST-0003SLOWVT: 25 J
TZST-0003SLOWVT: 35 J
TZST-0003SLOWVT: 35 J
VF: 0

## 2013-05-02 LAB — TROPONIN I
Troponin I: <0.3 ng/mL (ref <0.30)
Troponin I: <0.3 ng/mL (ref <0.30)

## 2013-05-02 MED ORDER — CLOPIDOGREL BISULFATE 75 MG PO TABS
75.0000 mg | ORAL_TABLET | Freq: Every day | ORAL | Status: DC
  Administered 2013-05-02 – 2013-05-04 (×3): 75 mg via ORAL
  Filled 2013-05-02 (×3): qty 1

## 2013-05-02 MED ORDER — SODIUM CHLORIDE 0.9 % IV SOLN: 250.0000 mL | INTRAVENOUS | Status: DC | PRN

## 2013-05-02 MED ORDER — METOPROLOL SUCCINATE ER 25 MG PO TB24
25.0000 mg | ORAL_TABLET | Freq: Every day | ORAL | Status: DC
  Administered 2013-05-02 – 2013-05-03 (×2): 25 mg via ORAL
  Filled 2013-05-02 (×3): qty 1

## 2013-05-02 MED ORDER — SODIUM CHLORIDE 0.9 % IJ SOLN: 3.0000 mL | INTRAMUSCULAR | Status: DC | PRN

## 2013-05-02 MED ORDER — ASPIRIN 81 MG PO CHEW
81.0000 mg | CHEWABLE_TABLET | Freq: Every day | ORAL | Status: DC
  Administered 2013-05-02 – 2013-05-03 (×2): 81 mg via ORAL
  Filled 2013-05-02 (×5): qty 1

## 2013-05-02 MED ORDER — PERFLUTREN LIPID MICROSPHERE
1.0000 mL | INTRAVENOUS | Status: AC | PRN
  Administered 2013-05-02: 13:00:00 10 mL via INTRAVENOUS
  Filled 2013-05-02: qty 10

## 2013-05-02 MED ORDER — SIMVASTATIN 20 MG PO TABS
20.0000 mg | ORAL_TABLET | Freq: Every day | ORAL | Status: DC
  Administered 2013-05-02 – 2013-05-03 (×2): 20 mg via ORAL
  Filled 2013-05-02 (×5): qty 1

## 2013-05-02 MED ORDER — SODIUM CHLORIDE 0.9 % IJ SOLN
3.0000 mL | Freq: Two times a day (BID) | INTRAMUSCULAR | Status: DC
  Administered 2013-05-02 – 2013-05-03 (×3): 3 mL via INTRAVENOUS

## 2013-05-02 MED ORDER — FUROSEMIDE 20 MG PO TABS
20.0000 mg | ORAL_TABLET | Freq: Every day | ORAL | Status: DC
  Administered 2013-05-02 – 2013-05-03 (×2): 20 mg via ORAL
  Filled 2013-05-02 (×3): qty 1

## 2013-05-02 MED ORDER — ONDANSETRON HCL 4 MG PO TABS: 4.0000 mg | ORAL_TABLET | Freq: Four times a day (QID) | ORAL | Status: DC | PRN

## 2013-05-02 MED ORDER — SODIUM CHLORIDE 0.9 % IJ SOLN
3.0000 mL | Freq: Two times a day (BID) | INTRAMUSCULAR | Status: DC
  Administered 2013-05-03 – 2013-05-04 (×3): 3 mL via INTRAVENOUS

## 2013-05-02 MED ORDER — SODIUM CHLORIDE 0.9 % IV SOLN
INTRAVENOUS | Status: DC
  Administered 2013-05-03: 06:00:00 via INTRAVENOUS

## 2013-05-02 MED ORDER — ONDANSETRON HCL 4 MG/2ML IJ SOLN: 4.0000 mg | Freq: Four times a day (QID) | INTRAMUSCULAR | Status: DC | PRN

## 2013-05-02 MED ORDER — PANTOPRAZOLE SODIUM 40 MG PO TBEC
40.0000 mg | DELAYED_RELEASE_TABLET | Freq: Every day | ORAL | Status: DC
  Administered 2013-05-02 – 2013-05-03 (×2): 40 mg via ORAL
  Filled 2013-05-02 (×3): qty 1

## 2013-05-02 NOTE — Progress Notes (Signed)
[facility]                                                                                Patient Demographics  Dakota Liu, is a 79 y.o. male, DOB - 09-20-1927, ZOX:[id number]  Admit date - 05/01/2013   Admitting Physician No admitting provider for patient encounter.  Outpatient Primary MD for the patient is Pamelia Hoit, MD  LOS - 1   Chief Complaint  Patient presents with  . Chest Pain        Assessment & Plan    Principal Problem:   Chest pain, atypical Active Problems:   Ischemic cardiomyopathy   Chronic systolic congestive heart failure   Atrial fibrillation   Automatic implantable cardiac defibrillator in situ   Anemia, iron deficiency  Chest pain, atypical  -Patient is currently chest pain-free. -Troponins have been negative. -Cardiology consulted. Stress test tomorrow. -Echocardiogram pending -Continue aspirin, clopidogrel, metoprolol, nitroglycerin  Ischemic cardiomyopathy with Chronic systolic congestive heart failure  -Echocardiogram pending -Continue daily weights, strict I. the nose, fluid restriction. Currently does not appear to be over volume overloaded. -Will continue Lasix  Atrial fibrillation  -Currently paced no medication at this time.  Anemia, iron deficiency -Hemoglobin 11.3.  Currently no active signs of bleeding. Will continue to monitor CBC.  Code Status: full  Family Communication: None  Disposition Plan: Admitted.  Expected discharge tomorrow.     Procedures  None  Consults   Cardiology  DVT Prophylaxis SCDs   Lab Results  Component Value Date   PLT 189 05/01/2013    Medications  Scheduled Meds: . aspirin  81 mg Oral Daily  . clopidogrel  75 mg Oral Q breakfast  . furosemide  20 mg Oral Daily  . metoprolol succinate  25 mg Oral Daily  . nitroGLYCERIN  0.5 inch Topical Q6H  . pantoprazole  40 mg Oral Daily  . simvastatin  20 mg Oral QHS  . sodium chloride  3 mL Intravenous Q12H  . sodium chloride   3 mL Intravenous Q12H   Continuous Infusions:  PRN Meds:.sodium chloride, nitroGLYCERIN, ondansetron (ZOFRAN) IV, ondansetron, sodium chloride  Antibiotics    Anti-infectives   None      Time Spent in minutes   30 minutes   Rana Hochstein D.O. on 05/02/2013 at 3:32 PM  Between 7am to 7pm - Pager - [phone]  After 7pm go to [email or web address] - password [email or web address]  And look for the night coverage person covering for me after hours  [facility] Office  [phone]    Subjective:   Dakota Liu seen and examined today. Patient no longer has any complaints of chest pain. He does wish to go home. Patient denies dizziness, shortness of breath, abdominal pain, N/V/D/C, new weakness, numbess, tingling.    Objective:   Filed Vitals:   05/02/13 0448 05/02/13 0634 05/02/13 1032 05/02/13 1413  BP: 82/50 102/60 128/60 102/36  Pulse: 75  87 80  Temp: 98 F (36.7 C)   98.1 F (36.7 C)  TempSrc: Oral   Oral  Resp: 20   18  Height:      Weight:      SpO2: 92%   94%  Wt Readings from Last 3 Encounters:  05/02/13 76.7 kg (169 lb 1.5 oz)  04/26/13 79.47 kg (175 lb 3.2 oz)  01/17/13 81.647 kg (180 lb)     Intake/Output Summary (Last 24 hours) at 05/02/13 1532 Last data filed at 05/02/13 1345  Gross per 24 hour  Intake    720 ml  Output    275 ml  Net    445 ml    Exam  General: Well developed, well nourished, NAD, appears stated age  HEENT: NCAT, PERRLA, EOMI, Anicteic Sclera, mucous membranes moist.   Neck: Supple, no JVD, no masses  Cardiovascular: S1 S2 auscultated, no rubs, murmurs or gallops. Regular rate and rhythm.  Respiratory: Clear to auscultation bilaterally with equal chest rise  Abdomen: Soft, nontender, nondistended, + bowel sounds  Extremities: warm dry without cyanosis clubbing or edema  Neuro: AAOx3, cranial nerves grossly intact.   Skin: Without rashes exudates or nodules  Psych: Normal affect and demeanor with intact  judgement and insight  Data Review   Micro Results No results found for this or any previous visit (from the past 240 hour(s)).  Radiology Reports Dg Chest 2 View  05/01/2013   CLINICAL DATA:  Chest pain.  EXAM: CHEST  2 VIEW  COMPARISON:  January 12, 2013.  FINDINGS: Status post coronary artery bypass graft. Left-sided pacemaker is noted and unchanged. Moderate left hiatal hernia is noted. Minimal left pleural effusion is noted. No pneumothorax seen. Curvilinear density is noted in right lung base consistent with subsegmental atelectasis or less likely pneumonia.  IMPRESSION: Moderate left hiatal hernia. Curvilinear density seen in right lung base consistent with subsegmental atelectasis or possibly pneumonia.   Electronically Signed   By: Roque Lias M.D.   On: 05/01/2013 21:30    CBC  Recent Labs Lab 05/01/13 2040  WBC 11.3*  HGB 11.5*  HCT 35.1*  PLT 189  MCV 83.6  MCH 27.4  MCHC 32.8  RDW 15.5    Chemistries   Recent Labs Lab 05/01/13 2040  NA 134*  K 3.3*  CL 95*  CO2 26  GLUCOSE 176*  BUN 14  CREATININE 0.91  CALCIUM 9.2   ------------------------------------------------------------------------------------------------------------------ estimated creatinine clearance is 66.7 ml/min (by C-G formula based on Cr of 0.91). ------------------------------------------------------------------------------------------------------------------ No results found for this basename: HGBA1C,  in the last 72 hours ------------------------------------------------------------------------------------------------------------------ No results found for this basename: CHOL, HDL, LDLCALC, TRIG, CHOLHDL, LDLDIRECT,  in the last 72 hours ------------------------------------------------------------------------------------------------------------------ No results found for this basename: TSH, T4TOTAL, FREET3, T3FREE, THYROIDAB,  in the last 72  hours ------------------------------------------------------------------------------------------------------------------ No results found for this basename: VITAMINB12, FOLATE, FERRITIN, TIBC, IRON, RETICCTPCT,  in the last 72 hours  Coagulation profile No results found for this basename: INR, PROTIME,  in the last 168 hours  No results found for this basename: DDIMER,  in the last 72 hours  Cardiac Enzymes  Recent Labs Lab 05/02/13 0158 05/02/13 0705 05/02/13 1326  TROPONINI <0.30 <0.30 <0.30   ------------------------------------------------------------------------------------------------------------------ No components found with this basename: POCBNP,

## 2013-05-02 NOTE — Progress Notes (Signed)
Pt had a 7 beat run of Vtach. Pt asymptomatic. Vitals WDL. MD notified. No new orders given. Will continue to monitor.  Genevive Bi, RN

## 2013-05-02 NOTE — ED Provider Notes (Signed)
Medical screening examination/treatment/procedure(s) were conducted as a shared visit with non-physician practitioner(s) and myself.  I personally evaluated the patient during the encounter.  EKG Interpretation     Ventricular Rate:  81 PR Interval:  132 QRS Duration: 132 QT Interval:  390 QTC Calculation: 453 R Axis:   150 Text Interpretation:  Atrial-sensed ventricular-paced rhythm Biventricular pacemaker detected No significant change since last tracing            Patient with multiple cardiac risk factors with chest pain and started at 4 PM tonight with concerning features. Patient given nitroglycerin and aspirin here. Initial troponin and EKG are unremarkable. Will admit for chest pain rule out  Gwyneth Sprout, MD 05/02/13 0005

## 2013-05-02 NOTE — Progress Notes (Signed)
Per Lenny Pastel, administer AM dose of 0.5in NTG and continue to monitor BP. Will continue to monitor. Troy Sine

## 2013-05-02 NOTE — Consult Note (Signed)
Reason for Consult: Chest Pain Referring Physician:   DIONTRE HARPS is an 79 y.o. male.  HPI:   Dakota Liu is a 79 y.o. male with a history of coronary artery disease, ischemic cardiomyopathy with severely depressed left ventricular systolic function, biventricular defibrillator, hypertension, hyperlipidemia and remote history of lymphoma. Patient wears braces on his ankles due to being unsteady on his feet.  His last NST was 10/2011 and revealed a moderate to severe perfusion defect which was consistent with scar and unchanged from prior.  Last 2-D echocardiogram was April 2013 showed an ejection fraction of 40-45% compared LV relaxation. Severe anterior wall hypokinesis. Moderate severe apical wall hypokinesis. Left atrium was moderately dilated. Right ventricular systolic pressure was 30-40 mm of mercury. Mild tricuspid regurgitation. Patient was seen on 01/11/2013 at the emergency room for chest pain that radiated to his back was sharp in nature. He was treated treated for musculoskeletal pain and provided with Toradol at discharge. Chest x-ray showed no acute abnormalities. BNP was mildly elevated at ~1000.   He presented yesterday with CP which began at 1600hrs.  He describes it as someone standing on his chest with radiation to his back, nausea and vomiting.  It was 7-8/10 in intensity.  The pain resolved after two SL NTG in the ER(~46minutes).  He denies  fever, shortness of breath, diaphoresis, orthopnea, dizziness, PND, cough, congestion, abdominal pain, hematochezia, melena, lower extremity edema, diarrhea, constipation, claudication.     Past Medical History  Diagnosis Date  . HTN (hypertension)   . Dyslipidemia   . Lymphoma     with metal radiation about 2006  . Epididymitis   . PAF (paroxysmal atrial fibrillation)   . Anemia, iron deficiency 11/21/2011  . CAD (coronary artery disease)   . Ischemic cardiomyopathy     mod to severe EF 30%  . CHF (congestive heart failure)   .  Atrial tachycardia     Past Surgical History  Procedure Laterality Date  . Tonsillectomy    . Orchiectomy    . Coronary artery bypass graft  1986  . Abdominal aortic aneurysm repair    . Cardiac defibrillator placement    . Coronary angioplasty with stent placement  07/05/10    RCA    Family History  Problem Relation Age of Onset  . Diabetes Mother   . Heart Problems Mother   . Stroke Father   . Cancer Sister     Social History:  reports that he quit smoking about 28 years ago. His smoking use included Cigarettes. He has a 44 pack-year smoking history. He has never used smokeless tobacco. He reports that he does not drink alcohol or use illicit drugs.  Allergies:  Allergies  Allergen Reactions  . Tramadol Nausea And Vomiting and Other (See Comments)    Dry heaving, GI upset  . Simvastatin     Myalgia- high doses (takes 20mg  at home without issue)    Medications: Prior to Admission medications   Medication Sig Start Date End Date Taking? Authorizing Provider  aspirin 81 MG tablet Take 81 mg by mouth daily.   Yes Historical Provider, MD  clopidogrel (PLAVIX) 75 MG tablet Take 75 mg by mouth daily.     Yes Historical Provider, MD  furosemide (LASIX) 20 MG tablet Take 20 mg by mouth daily.    Yes Historical Provider, MD  metoprolol succinate (TOPROL-XL) 25 MG 24 hr tablet Take 25 mg by mouth daily.     Yes Historical Provider, MD  omeprazole (PRILOSEC) 20 MG capsule Take 20 mg by mouth daily.     Yes Historical Provider, MD  potassium chloride SA (K-DUR,KLOR-CON) 20 MEQ tablet TAKE 1 TABLET BY MOUTH EVERY DAY 04/19/13  Yes Marykay Lex, MD  simvastatin (ZOCOR) 40 MG tablet Take 20 mg by mouth at bedtime.    Yes Historical Provider, MD  valsartan-hydrochlorothiazide (DIOVAN-HCT) 320-12.5 MG per tablet Take 1 tablet by mouth daily. 03/07/13  Yes Historical Provider, MD  nitroGLYCERIN (NITROSTAT) 0.4 MG SL tablet Place 0.4 mg under the tongue every 5 (five) minutes as needed.       Historical Provider, MD     Results for orders placed during the hospital encounter of 05/01/13 (from the past 48 hour(s))  CBC     Status: Abnormal   Collection Time    05/01/13  8:40 PM      Result Value Range   WBC 11.3 (*) 4.0 - 10.5 K/uL   RBC 4.20 (*) 4.22 - 5.81 MIL/uL   Hemoglobin 11.5 (*) 13.0 - 17.0 g/dL   HCT 78.2 (*) 95.6 - 21.3 %   MCV 83.6  78.0 - 100.0 fL   MCH 27.4  26.0 - 34.0 pg   MCHC 32.8  30.0 - 36.0 g/dL   RDW 08.6  57.8 - 46.9 %   Platelets 189  150 - 400 K/uL  BASIC METABOLIC PANEL     Status: Abnormal   Collection Time    05/01/13  8:40 PM      Result Value Range   Sodium 134 (*) 135 - 145 mEq/L   Potassium 3.3 (*) 3.5 - 5.1 mEq/L   Chloride 95 (*) 96 - 112 mEq/L   CO2 26  19 - 32 mEq/L   Glucose, Bld 176 (*) 70 - 99 mg/dL   BUN 14  6 - 23 mg/dL   Creatinine, Ser 6.29  0.50 - 1.35 mg/dL   Calcium 9.2  8.4 - 52.8 mg/dL   GFR calc non Af Amer 76 (*) >90 mL/min   GFR calc Af Amer 88 (*) >90 mL/min   Comment: (NOTE)     The eGFR has been calculated using the CKD EPI equation.     This calculation has not been validated in all clinical situations.     eGFR's persistently <90 mL/min signify possible Chronic Kidney     Disease.  PRO B NATRIURETIC PEPTIDE     Status: Abnormal   Collection Time    05/01/13  8:40 PM      Result Value Range   Pro B Natriuretic peptide (BNP) 1064.0 (*) 0 - 450 pg/mL  POCT I-STAT TROPONIN I     Status: None   Collection Time    05/01/13  8:43 PM      Result Value Range   Troponin i, poc 0.00  0.00 - 0.08 ng/mL   Comment 3            Comment: Due to the release kinetics of cTnI,     a negative result within the first hours     of the onset of symptoms does not rule out     myocardial infarction with certainty.     If myocardial infarction is still suspected,     repeat the test at appropriate intervals.  TROPONIN I     Status: None   Collection Time    05/02/13  1:58 AM      Result Value Range   Troponin I <0.30   <  0.30 ng/mL   Comment:            Due to the release kinetics of cTnI,     a negative result within the first hours     of the onset of symptoms does not rule out     myocardial infarction with certainty.     If myocardial infarction is still suspected,     repeat the test at appropriate intervals.  TROPONIN I     Status: None   Collection Time    05/02/13  7:05 AM      Result Value Range   Troponin I <0.30  <0.30 ng/mL   Comment:            Due to the release kinetics of cTnI,     a negative result within the first hours     of the onset of symptoms does not rule out     myocardial infarction with certainty.     If myocardial infarction is still suspected,     repeat the test at appropriate intervals.    Dg Chest 2 View  05/01/2013   CLINICAL DATA:  Chest pain.  EXAM: CHEST  2 VIEW  COMPARISON:  January 12, 2013.  FINDINGS: Status post coronary artery bypass graft. Left-sided pacemaker is noted and unchanged. Moderate left hiatal hernia is noted. Minimal left pleural effusion is noted. No pneumothorax seen. Curvilinear density is noted in right lung base consistent with subsegmental atelectasis or less likely pneumonia.  IMPRESSION: Moderate left hiatal hernia. Curvilinear density seen in right lung base consistent with subsegmental atelectasis or possibly pneumonia.   Electronically Signed   By: Roque Lias M.D.   On: 05/01/2013 21:30    Review of Systems  Constitutional: Negative for fever and diaphoresis.  HENT: Negative for congestion and sore throat.   Respiratory: Negative for cough and shortness of breath.   Cardiovascular: Positive for chest pain. Negative for orthopnea, leg swelling and PND.  Gastrointestinal: Positive for nausea and vomiting. Negative for abdominal pain, diarrhea, constipation, blood in stool and melena.  Genitourinary: Negative for dysuria and hematuria.  Musculoskeletal: Positive for back pain.  Neurological: Negative for dizziness.  All other systems  reviewed and are negative.   Blood pressure 102/60, pulse 75, temperature 98 F (36.7 C), temperature source Oral, resp. rate 20, height 6\' 2"  (1.88 m), weight 169 lb 1.5 oz (76.7 kg), SpO2 92.00%. Physical Exam  Nursing note and vitals reviewed. Constitutional: He is oriented to person, place, and time. He appears well-developed and well-nourished. No distress.  HENT:  Head: Normocephalic and atraumatic.  Eyes: EOM are normal. Pupils are equal, round, and reactive to light. No scleral icterus.  Neck: Normal range of motion. Neck supple. No JVD present.  Cardiovascular: Normal rate, regular rhythm, S1 normal and S2 normal.   No murmur heard. Pulses:      Radial pulses are 2+ on the right side, and 2+ on the left side.       Dorsalis pedis pulses are 2+ on the right side, and 1+ on the left side.       Posterior tibial pulses are 2+ on the right side, and 2+ on the left side.  Left carotid bruit.  Respiratory: Effort normal and breath sounds normal. He has no wheezes. He has no rales. He exhibits no tenderness.  GI: Soft. He exhibits no distension. There is no tenderness.  Musculoskeletal: He exhibits no edema.  No LEE   Lymphadenopathy:  He has no cervical adenopathy.  Neurological: He is alert and oriented to person, place, and time. He exhibits normal muscle tone.  Skin: Skin is warm and dry.  Psychiatric: He has a normal mood and affect.    Assessment/Plan: Principal Problem:   Chest pain, atypical Active Problems:   Ischemic cardiomyopathy   Chronic systolic congestive heart failure   Atrial fibrillation   Automatic implantable cardiac defibrillator in situ   Anemia, iron deficiency  Plan:  His symptoms as described are concerning.  Troponin however, is negative.  EKG is V paced.  He did have PCI in March 2012 to the SVG to RCA for in-stent restenosis.  I would recommend repeating NST as inpatient.  The patient already ate today.  HAGER, BRYAN 05/02/2013, 9:05 AM     Patient seen and examined. Agree with assessment and plan. Very pleasant 79 yo WM who is 26 yrs s/p CABG with a history of an ischemic cardiomyopathy, hypertension with EF in 2013 40-45%. He is s/p PCI to SVG to RCA 21/2 yrs ago. He tells me that his symptome yesterday were very similar to his heart attack pain in the past. ECG is nondiagnostic secondary to paced rhythm.  I would favor cath for definitive evaluation. Discussed at length with patient who agrees to proceed. Renal fxn stable with Cr .91.   Lennette Bihari, MD, [facility] 05/02/2013 6:28 PM

## 2013-05-02 NOTE — Progress Notes (Signed)
Received report that patient's BP low, 102/60 RN recheck. Patient asymptomatic, Lenny Pastel notified. Will continue to monitor. Troy Sine

## 2013-05-02 NOTE — Progress Notes (Signed)
Echocardiogram 2D Echocardiogram with definity has been performed.  Vyom, Brass 05/02/2013, 12:32 PM

## 2013-05-03 ENCOUNTER — Encounter (HOSPITAL_COMMUNITY): Payer: Self-pay | Admitting: Cardiology

## 2013-05-03 ENCOUNTER — Encounter (HOSPITAL_COMMUNITY)
Admission: EM | Disposition: A | Discharge status: Home / Self Care | Payer: Self-pay | Source: Home / Self Care | Attending: Emergency Medicine

## 2013-05-03 DIAGNOSIS — I447 Left bundle-branch block, unspecified: Secondary | ICD-10-CM | POA: Diagnosis present

## 2013-05-03 DIAGNOSIS — I2510 Atherosclerotic heart disease of native coronary artery without angina pectoris: Secondary | ICD-10-CM | POA: Diagnosis present

## 2013-05-03 DIAGNOSIS — E785 Hyperlipidemia, unspecified: Secondary | ICD-10-CM | POA: Diagnosis present

## 2013-05-03 DIAGNOSIS — I472 Ventricular tachycardia: Secondary | ICD-10-CM | POA: Diagnosis not present

## 2013-05-03 DIAGNOSIS — I739 Peripheral vascular disease, unspecified: Secondary | ICD-10-CM | POA: Diagnosis present

## 2013-05-03 HISTORY — PX: LEFT HEART CATHETERIZATION WITH CORONARY/GRAFT ANGIOGRAM: SHX5450

## 2013-05-03 LAB — CREATININE, SERUM: GFR calc non Af Amer: 66 mL/min — ABNORMAL LOW (ref >90)

## 2013-05-03 LAB — CBC
HCT: 29.1 % — ABNORMAL LOW (ref 39.0–52.0)
Hemoglobin: 9.7 g/dL — ABNORMAL LOW (ref 13.0–17.0)
MCH: 28 pg (ref 26.0–34.0)
MCV: 84.1 fL (ref 78.0–100.0)
Platelets: 140 10*3/uL — ABNORMAL LOW (ref 150–400)
RBC: 3.46 MIL/uL — ABNORMAL LOW (ref 4.22–5.81)
RDW: 16.2 % — ABNORMAL HIGH (ref 11.5–15.5)
WBC: 4.5 10*3/uL (ref 4.0–10.5)

## 2013-05-03 LAB — PROTIME-INR
INR: 1.33 (ref 0.00–1.49)
Prothrombin Time: 16.2 seconds — ABNORMAL HIGH (ref 11.6–15.2)

## 2013-05-03 LAB — BASIC METABOLIC PANEL
BUN: 19 mg/dL (ref 6–23)
CO2: 27 mEq/L (ref 19–32)
Chloride: 95 mEq/L — ABNORMAL LOW (ref 96–112)
Creatinine, Ser: 1.03 mg/dL (ref 0.50–1.35)
GFR calc Af Amer: 75 mL/min — ABNORMAL LOW (ref >90)
GFR calc non Af Amer: 65 mL/min — ABNORMAL LOW (ref >90)
Potassium: 3.3 mEq/L — ABNORMAL LOW (ref 3.5–5.1)
Sodium: 134 mEq/L — ABNORMAL LOW (ref 135–145)

## 2013-05-03 SURGERY — LEFT HEART CATHETERIZATION WITH CORONARY/GRAFT ANGIOGRAM
Anesthesia: LOCAL

## 2013-05-03 MED ORDER — VERAPAMIL HCL 2.5 MG/ML IV SOLN
INTRAVENOUS | Status: AC
  Filled 2013-05-03: qty 2

## 2013-05-03 MED ORDER — POTASSIUM CHLORIDE CRYS ER 20 MEQ PO TBCR
40.0000 meq | EXTENDED_RELEASE_TABLET | Freq: Once | ORAL | Status: AC
  Administered 2013-05-04: 11:00:00 40 meq via ORAL
  Filled 2013-05-03: qty 2

## 2013-05-03 MED ORDER — POTASSIUM CHLORIDE CRYS ER 20 MEQ PO TBCR
40.0000 meq | EXTENDED_RELEASE_TABLET | Freq: Once | ORAL | Status: DC
  Filled 2013-05-03: qty 2

## 2013-05-03 MED ORDER — SODIUM CHLORIDE 0.9 % IJ SOLN: 3.0000 mL | INTRAMUSCULAR | Status: DC | PRN

## 2013-05-03 MED ORDER — ASPIRIN 81 MG PO CHEW: 81.0000 mg | CHEWABLE_TABLET | ORAL | Status: DC

## 2013-05-03 MED ORDER — SODIUM CHLORIDE 0.9 % IV SOLN: 250.0000 mL | INTRAVENOUS | Status: DC | PRN

## 2013-05-03 MED ORDER — NITROGLYCERIN 0.2 MG/ML ON CALL CATH LAB
INTRAVENOUS | Status: AC
  Filled 2013-05-03: qty 1

## 2013-05-03 MED ORDER — SODIUM CHLORIDE 0.9 % IJ SOLN
3.0000 mL | Freq: Two times a day (BID) | INTRAMUSCULAR | Status: DC
  Administered 2013-05-03: 3 mL via INTRAVENOUS

## 2013-05-03 MED ORDER — HEPARIN (PORCINE) IN NACL 2-0.9 UNIT/ML-% IJ SOLN
INTRAMUSCULAR | Status: AC
  Filled 2013-05-03: qty 1500

## 2013-05-03 MED ORDER — HEPARIN SODIUM (PORCINE) 5000 UNIT/ML IJ SOLN
5000.0000 [IU] | Freq: Three times a day (TID) | INTRAMUSCULAR | Status: DC
  Filled 2013-05-03 (×4): qty 1

## 2013-05-03 MED ORDER — HEPARIN SODIUM (PORCINE) 1000 UNIT/ML IJ SOLN
INTRAMUSCULAR | Status: AC
  Filled 2013-05-03: qty 1

## 2013-05-03 MED ORDER — ACTIVE PARTNERSHIP FOR HEALTH OF YOUR HEART BOOK
Freq: Once | Status: AC
  Administered 2013-05-03
  Filled 2013-05-03: qty 1

## 2013-05-03 MED ORDER — MIDAZOLAM HCL 2 MG/2ML IJ SOLN
INTRAMUSCULAR | Status: AC
  Filled 2013-05-03: qty 2

## 2013-05-03 MED ORDER — SODIUM CHLORIDE 0.9 % IV SOLN: 1.0000 mL/kg/h | INTRAVENOUS | Status: AC

## 2013-05-03 MED ORDER — LIDOCAINE HCL (PF) 1 % IJ SOLN
INTRAMUSCULAR | Status: AC
  Filled 2013-05-03: qty 30

## 2013-05-03 NOTE — Progress Notes (Addendum)
[facility]                                                                                Patient Demographics  Dakota Liu, is a 79 y.o. male, DOB - Jun 08, 1928, ZOX:[id number]  Admit date - 05/01/2013   Admitting Physician Chrystie Nose, MD  Outpatient Primary MD for the patient is Pamelia Hoit, MD  LOS - 2   Chief Complaint  Patient presents with  . Chest Pain      Interim history This is an 79 year old male with a history of ischemic cardiomyopathy, coronary disease status post CABG other presented for chest pain. His chest pain was very reminiscent of the chest when he had previously with his myocardial infarctions. Cardiology was consulted, patient is catheterization today.  Assessment & Plan   Principal Problem:   Unstable angina Active Problems:   Ischemic cardiomyopathy- EF improved to 40-45% 10/14   Chronic systolic congestive heart failure   PAF - 12/11- (no Coumadin secondary to unsteady gait)   S/P Biv ICD 12/11   Follicular lymphoma of head, face, and neck-'06   Anemia, iron deficiency   CAD - CABG '88, RCA Stent 12/11, ISR rx'd with HSRA 3/12   LBBB (left bundle branch block)   PVD (peripheral vascular disease)- AAA R&G Nov 2010   Dyslipidemia   VT (ventricular tachycardia) - 12/11  Chest pain, atypical  -Patient is currently chest pain-free. -Troponins have been negative. -Cardiology consulted and planning cath today. -Continue aspirin, clopidogrel, metoprolol, nitroglycerin  Ischemic cardiomyopathy with Chronic systolic congestive heart failure  -EF 40-40% -Continue daily weights, strict I's and O's, fluid restriction. Currently does not appear to be over volume overloaded. -Will continue Lasix  Atrial fibrillation  -Currently paced no medication at this time.  Anemia, iron deficiency -Hemoglobin 11.3.  Currently no active signs of bleeding. Will continue to monitor CBC.  Code Status: full  Family Communication: Wife at  bedside.  Disposition Plan: Admitted.    Procedures  Heart catheterization  Echocardiogram Study Conclusions - Left ventricle: The cavity size was mildly dilated. Wall thickness was increased in a pattern of mild LVH. There was mild concentric hypertrophy. Systolic function was mildly to moderately reduced. The estimated ejection fraction was in the range of 40% to 45%. There is severe hypokinesis of the mid-distalanteroseptal and apical myocardium. The study is not technically sufficient to allow evaluation of LV diastolic function. - Aortic valve: Trileaflet; mildly thickened, mildly calcified leaflets. Sclerosis without stenosis. - Mitral valve: Mildly thickened leaflets . - Left atrium: The atrium was moderately dilated.  Consults   Cardiology  DVT Prophylaxis SCDs   Lab Results  Component Value Date   PLT 189 05/01/2013    Medications  Scheduled Meds: . aspirin  81 mg Oral Daily  . [START ON 05/04/2013] aspirin  81 mg Oral Pre-Cath  . clopidogrel  75 mg Oral Q breakfast  . furosemide  20 mg Oral Daily  . metoprolol succinate  25 mg Oral Daily  . nitroGLYCERIN  0.5 inch Topical Q6H  . pantoprazole  40 mg Oral Daily  . potassium chloride  40 mEq Oral Once  . potassium chloride  40 mEq  Oral Once  . simvastatin  20 mg Oral QHS  . sodium chloride  3 mL Intravenous Q12H  . sodium chloride  3 mL Intravenous Q12H  . sodium chloride  3 mL Intravenous Q12H   Continuous Infusions: . sodium chloride 75 mL/hr at 05/03/13 0600   PRN Meds:.sodium chloride, sodium chloride, nitroGLYCERIN, ondansetron (ZOFRAN) IV, ondansetron, sodium chloride, sodium chloride  Antibiotics    Anti-infectives   None      Time Spent in minutes  25 minutes   Chantale Leugers D.O. on 05/03/2013 at 1:01 PM  Between 7am to 7pm - Pager - [phone]  After 7pm go to [email or web address] - password [email or web address]  And look for the night coverage person covering for me after hours  [facility] Office  [phone]    Subjective:   Dakota Liu seen and examined today. Patient no longer has any complaints of chest pain. He does wish to go home. Patient denies dizziness, shortness of breath, abdominal pain, N/V/D/C, new weakness, numbess, tingling.    Objective:   Filed Vitals:   05/02/13 2008 05/02/13 2355 05/03/13 0606 05/03/13 0946  BP: 93/51 95/53 116/57 130/71  Pulse: 66 65 70   Temp: 98.1 F (36.7 C) 97.9 F (36.6 C) 97.8 F (36.6 C)   TempSrc: Oral Oral Oral   Resp: 19 20 18    Height:      Weight:   76.34 kg (168 lb 4.8 oz)   SpO2: 95%  94%     Wt Readings from Last 3 Encounters:  05/03/13 76.34 kg (168 lb 4.8 oz)  05/03/13 76.34 kg (168 lb 4.8 oz)  04/26/13 79.47 kg (175 lb 3.2 oz)     Intake/Output Summary (Last 24 hours) at 05/03/13 1301 Last data filed at 05/03/13 0857  Gross per 24 hour  Intake    760 ml  Output    475 ml  Net    285 ml    Exam  General: Well developed, well nourished, NAD, appears stated age  HEENT: NCAT, PERRLA, EOMI, Anicteic Sclera, mucous membranes moist.   Neck: Supple, no JVD, no masses  Cardiovascular: S1 S2 auscultated, no rubs, murmurs or gallops. Regular rate and rhythm.  Respiratory: Clear to auscultation bilaterally with equal chest rise  Abdomen: Soft, nontender, nondistended, + bowel sounds  Extremities: warm dry without cyanosis clubbing or edema  Neuro: AAOx3, cranial nerves grossly intact.   Skin: Without rashes exudates or nodules  Psych: Normal affect and demeanor with intact judgement and insight  Data Review   Micro Results No results found for this or any previous visit (from the past 240 hour(s)).  Radiology Reports Dg Chest 2 View  05/01/2013   CLINICAL DATA:  Chest pain.  EXAM: CHEST  2 VIEW  COMPARISON:  January 12, 2013.  FINDINGS: Status post coronary artery bypass graft. Left-sided pacemaker is noted and unchanged. Moderate left hiatal hernia is noted. Minimal left pleural  effusion is noted. No pneumothorax seen. Curvilinear density is noted in right lung base consistent with subsegmental atelectasis or less likely pneumonia.  IMPRESSION: Moderate left hiatal hernia. Curvilinear density seen in right lung base consistent with subsegmental atelectasis or possibly pneumonia.   Electronically Signed   By: Roque Lias M.D.   On: 05/01/2013 21:30    CBC  Recent Labs Lab 05/01/13 2040  WBC 11.3*  HGB 11.5*  HCT 35.1*  PLT 189  MCV 83.6  MCH 27.4  MCHC 32.8  RDW 15.5    Chemistries  Recent Labs Lab 05/01/13 2040 05/03/13 0820  NA 134* 134*  K 3.3* 3.3*  CL 95* 95*  CO2 26 27  GLUCOSE 176* 103*  BUN 14 19  CREATININE 0.91 1.03  CALCIUM 9.2 8.6   ------------------------------------------------------------------------------------------------------------------ estimated creatinine clearance is 58.6 ml/min (by C-G formula based on Cr of 1.03). ------------------------------------------------------------------------------------------------------------------ No results found for this basename: HGBA1C,  in the last 72 hours ------------------------------------------------------------------------------------------------------------------ No results found for this basename: CHOL, HDL, LDLCALC, TRIG, CHOLHDL, LDLDIRECT,  in the last 72 hours ------------------------------------------------------------------------------------------------------------------ No results found for this basename: TSH, T4TOTAL, FREET3, T3FREE, THYROIDAB,  in the last 72 hours ------------------------------------------------------------------------------------------------------------------ No results found for this basename: VITAMINB12, FOLATE, FERRITIN, TIBC, IRON, RETICCTPCT,  in the last 72 hours  Coagulation profile  Recent Labs Lab 05/03/13 0606  INR 1.33    No results found for this basename: DDIMER,  in the last 72 hours  Cardiac Enzymes  Recent Labs Lab  05/02/13 0158 05/02/13 0705 05/02/13 1326  TROPONINI <0.30 <0.30 <0.30   ------------------------------------------------------------------------------------------------------------------ No components found with this basename: POCBNP,

## 2013-05-03 NOTE — CV Procedure (Signed)
    Cardiac Catheterization Procedure Note  Name: Dakota Liu MRN: [id number] DOB: 27-Dec-1927  Procedure: Left Heart Cath, Selective Coronary Angiography, SVG angiography, LIMA angiography, LV angiography  Indication: 79 yo WM with history of CAD s/p CABG 26 years ago. He is s/p stenting of the distal RCA in 2011 with cutting balloon angioplasty in 2012 for in stent restenosis. He presents now with symptoms of chest pain concerning for unstable angina. Troponins are negative.   Procedural Details: The left wrist was prepped, draped, and anesthetized with 1% lidocaine. Using the modified Seldinger technique, a 5 French sheath was introduced into the left radial artery. 3 mg of verapamil was administered through the sheath, weight-based unfractionated heparin was administered intravenously. Standard Judkins catheters were used for selective coronary angiography, graft angiography and left ventriculography. A RCB catheter was used to image the SVG to the RCA. All other grafts were engaged with a JR4 catheter.Catheter exchanges were performed over an exchange length guidewire. There were no immediate procedural complications. A TR band was used for radial hemostasis at the completion of the procedure.  The patient was transferred to the post catheterization recovery area for further monitoring. 115 cc of contrast was used.  Procedural Findings: Hemodynamics: AO 118/49 mean 82 mm Hg LV 128/24 mm Hg  Coronary angiography: Coronary dominance: right  Left mainstem: 70% mid left main.  Left anterior descending (LAD): 95% ostial LAD then occluded proximally.  Left circumflex (LCx): There is the continuation of the LCx in the AV groove. All OM branches are occluded.  Right coronary artery (RCA): Occluded proximally.  The SVG sequentially to OM1/OM2/and OM3 is widely patent.  The SVG to the diagonal has mild disease distally to 20%. Good runoff.  The SVG to the RCA is patent. There is an  eccentric 50-60% stenosis in the mid vein graft. The distal RCA is patent at the prior stent site.  The LIMA graft to the LAD is widely patent.  Left ventriculography: Left ventricular systolic function is abnormal, LVEF is estimated at 30%, there is akinesis of the mid to distal anterior wall and mid to distal inferior wall. The apex is mildly dyskinetic. There is no significant mitral regurgitation   Final Conclusions:   1. Severe 3 vessel occlusive CAD. 2. All grafts are patent including LIMA to the LAD, SVG to the diagonal, SVG sequentially to OM1/OM2/OM3, and SVG to the RCA. The distal RCA is patent at the prior stent site. 3. Severe LV dysfunction.  Recommendations: There is no new disease to explain his recent chest pain symptoms. Recommend continued medical therapy.  Theron [facility] 05/03/2013, 5:30 PM

## 2013-05-03 NOTE — Progress Notes (Signed)
Subjective:  No chest pain  Objective:  Vital Signs in the last 24 hours: Temp:  [97.8 F (36.6 C)-98.1 F (36.7 C)] 97.8 F (36.6 C) (10/28 0606) Pulse Rate:  [65-87] 70 (10/28 0606) Resp:  [18-20] 18 (10/28 0606) BP: (90-128)/(36-60) 116/57 mmHg (10/28 0606) SpO2:  [94 %-95 %] 94 % (10/28 0606) Weight:  [168 lb 4.8 oz (76.34 kg)] 168 lb 4.8 oz (76.34 kg) (10/28 0606)  Intake/Output from previous day:  Intake/Output Summary (Last 24 hours) at 05/03/13 0938 Last data filed at 05/03/13 0857  Gross per 24 hour  Intake    760 ml  Output    475 ml  Net    285 ml    Physical Exam: General appearance: alert, cooperative, no distress and pale Lungs: decreased breath sounds Lt base Heart: regular rate and rhythm   Rate: 70  Rhythm: paced  Lab Results:  Recent Labs  05/01/13 2040  WBC 11.3*  HGB 11.5*  PLT 189    Recent Labs  05/01/13 2040 05/03/13 0820  NA 134* 134*  K 3.3* 3.3*  CL 95* 95*  CO2 26 27  GLUCOSE 176* 103*  BUN 14 19  CREATININE 0.91 1.03    Recent Labs  05/02/13 0705 05/02/13 1326  TROPONINI <0.30 <0.30    Recent Labs  05/03/13 0606  INR 1.33    Imaging: Imaging results have been reviewed  Cardiac Studies:  Assessment/Plan:   Principal Problem:   Unstable angina Active Problems:   Ischemic cardiomyopathy- EF improved to 40-45% 10/14   S/P Biv ICD 12/11   CAD - CABG '88, RCA Stent 12/11, ISR rx'd with HSRA 3/12   PAF - 12/11- (no Coumadin secondary to unsteady gait)   Follicular lymphoma of head, face, and neck-'06   LBBB (left bundle branch block)   PVD (peripheral vascular disease)- AAA R&G Nov 2010   Dyslipidemia   VT (ventricular tachycardia) - 12/11   Chronic systolic congestive heart failure   Anemia, iron deficiency    PLAN:   79 y/o s/p CABG in 23 (LIMA-LAD, SVG-Dx, SVG-OM1,OM2, SVG-RCA). He had VT 12/11. His grafts were patent but he had distal RCA disease Rx'd with a stent. He also recieved a BiV ICD 12/11  and has been a responder. He had ISR of the RCA stent Rx'd with HSRA March 2012. Admitted now with [place]. Cath today.  Corine Shelter PA-C Beeper [phone] 05/03/2013, 9:38 AM

## 2013-05-03 NOTE — H&P (View-Only) (Signed)
Reason for Consult: Chest Pain Referring Physician:   Franck S Bero is an 79 y.o. male.  HPI:   Dakota Liu is a 79 y.o. male with a history of coronary artery disease, ischemic cardiomyopathy with severely depressed left ventricular systolic function, biventricular defibrillator, hypertension, hyperlipidemia and remote history of lymphoma. Patient wears braces on his ankles due to being unsteady on his feet.  His last NST was 10/2011 and revealed a moderate to severe perfusion defect which was consistent with scar and unchanged from prior.  Last 2-D echocardiogram was April 2013 showed an ejection fraction of 40-45% compared LV relaxation. Severe anterior wall hypokinesis. Moderate severe apical wall hypokinesis. Left atrium was moderately dilated. Right ventricular systolic pressure was 30-40 mm of mercury. Mild tricuspid regurgitation. Patient was seen on 01/11/2013 at the emergency room for chest pain that radiated to his back was sharp in nature. He was treated treated for musculoskeletal pain and provided with Toradol at discharge. Chest x-ray showed no acute abnormalities. BNP was mildly elevated at ~1000.   He presented yesterday with CP which began at 1600hrs.  He describes it as someone standing on his chest with radiation to his back, nausea and vomiting.  It was 7-8/10 in intensity.  The pain resolved after two SL NTG in the ER(~10minutes).  He denies  fever, shortness of breath, diaphoresis, orthopnea, dizziness, PND, cough, congestion, abdominal pain, hematochezia, melena, lower extremity edema, diarrhea, constipation, claudication.     Past Medical History  Diagnosis Date  . HTN (hypertension)   . Dyslipidemia   . Lymphoma     with metal radiation about 2006  . Epididymitis   . PAF (paroxysmal atrial fibrillation)   . Anemia, iron deficiency 11/21/2011  . CAD (coronary artery disease)   . Ischemic cardiomyopathy     mod to severe EF 30%  . CHF (congestive heart failure)   .  Atrial tachycardia     Past Surgical History  Procedure Laterality Date  . Tonsillectomy    . Orchiectomy    . Coronary artery bypass graft  1986  . Abdominal aortic aneurysm repair    . Cardiac defibrillator placement    . Coronary angioplasty with stent placement  07/05/10    RCA    Family History  Problem Relation Age of Onset  . Diabetes Mother   . Heart Problems Mother   . Stroke Father   . Cancer Sister     Social History:  reports that he quit smoking about 28 years ago. His smoking use included Cigarettes. He has a 44 pack-year smoking history. He has never used smokeless tobacco. He reports that he does not drink alcohol or use illicit drugs.  Allergies:  Allergies  Allergen Reactions  . Tramadol Nausea And Vomiting and Other (See Comments)    Dry heaving, GI upset  . Simvastatin     Myalgia- high doses (takes 20mg at home without issue)    Medications: Prior to Admission medications   Medication Sig Start Date End Date Taking? Authorizing Provider  aspirin 81 MG tablet Take 81 mg by mouth daily.   Yes Historical Provider, MD  clopidogrel (PLAVIX) 75 MG tablet Take 75 mg by mouth daily.     Yes Historical Provider, MD  furosemide (LASIX) 20 MG tablet Take 20 mg by mouth daily.    Yes Historical Provider, MD  metoprolol succinate (TOPROL-XL) 25 MG 24 hr tablet Take 25 mg by mouth daily.     Yes Historical Provider, MD    omeprazole (PRILOSEC) 20 MG capsule Take 20 mg by mouth daily.     Yes Historical Provider, MD  potassium chloride SA (K-DUR,KLOR-CON) 20 MEQ tablet TAKE 1 TABLET BY MOUTH EVERY DAY 04/19/13  Yes David W Harding, MD  simvastatin (ZOCOR) 40 MG tablet Take 20 mg by mouth at bedtime.    Yes Historical Provider, MD  valsartan-hydrochlorothiazide (DIOVAN-HCT) 320-12.5 MG per tablet Take 1 tablet by mouth daily. 03/07/13  Yes Historical Provider, MD  nitroGLYCERIN (NITROSTAT) 0.4 MG SL tablet Place 0.4 mg under the tongue every 5 (five) minutes as needed.       Historical Provider, MD     Results for orders placed during the hospital encounter of 05/01/13 (from the past 48 hour(s))  CBC     Status: Abnormal   Collection Time    05/01/13  8:40 PM      Result Value Range   WBC 11.3 (*) 4.0 - 10.5 K/uL   RBC 4.20 (*) 4.22 - 5.81 MIL/uL   Hemoglobin 11.5 (*) 13.0 - 17.0 g/dL   HCT 35.1 (*) 39.0 - 52.0 %   MCV 83.6  78.0 - 100.0 fL   MCH 27.4  26.0 - 34.0 pg   MCHC 32.8  30.0 - 36.0 g/dL   RDW 15.5  11.5 - 15.5 %   Platelets 189  150 - 400 K/uL  BASIC METABOLIC PANEL     Status: Abnormal   Collection Time    05/01/13  8:40 PM      Result Value Range   Sodium 134 (*) 135 - 145 mEq/L   Potassium 3.3 (*) 3.5 - 5.1 mEq/L   Chloride 95 (*) 96 - 112 mEq/L   CO2 26  19 - 32 mEq/L   Glucose, Bld 176 (*) 70 - 99 mg/dL   BUN 14  6 - 23 mg/dL   Creatinine, Ser 0.91  0.50 - 1.35 mg/dL   Calcium 9.2  8.4 - 10.5 mg/dL   GFR calc non Af Amer 76 (*) >90 mL/min   GFR calc Af Amer 88 (*) >90 mL/min   Comment: (NOTE)     The eGFR has been calculated using the CKD EPI equation.     This calculation has not been validated in all clinical situations.     eGFR's persistently <90 mL/min signify possible Chronic Kidney     Disease.  PRO B NATRIURETIC PEPTIDE     Status: Abnormal   Collection Time    05/01/13  8:40 PM      Result Value Range   Pro B Natriuretic peptide (BNP) 1064.0 (*) 0 - 450 pg/mL  POCT I-STAT TROPONIN I     Status: None   Collection Time    05/01/13  8:43 PM      Result Value Range   Troponin i, poc 0.00  0.00 - 0.08 ng/mL   Comment 3            Comment: Due to the release kinetics of cTnI,     a negative result within the first hours     of the onset of symptoms does not rule out     myocardial infarction with certainty.     If myocardial infarction is still suspected,     repeat the test at appropriate intervals.  TROPONIN I     Status: None   Collection Time    05/02/13  1:58 AM      Result Value Range   Troponin I <0.30   <  0.30 ng/mL   Comment:            Due to the release kinetics of cTnI,     a negative result within the first hours     of the onset of symptoms does not rule out     myocardial infarction with certainty.     If myocardial infarction is still suspected,     repeat the test at appropriate intervals.  TROPONIN I     Status: None   Collection Time    05/02/13  7:05 AM      Result Value Range   Troponin I <0.30  <0.30 ng/mL   Comment:            Due to the release kinetics of cTnI,     a negative result within the first hours     of the onset of symptoms does not rule out     myocardial infarction with certainty.     If myocardial infarction is still suspected,     repeat the test at appropriate intervals.    Dg Chest 2 View  05/01/2013   CLINICAL DATA:  Chest pain.  EXAM: CHEST  2 VIEW  COMPARISON:  January 12, 2013.  FINDINGS: Status post coronary artery bypass graft. Left-sided pacemaker is noted and unchanged. Moderate left hiatal hernia is noted. Minimal left pleural effusion is noted. No pneumothorax seen. Curvilinear density is noted in right lung base consistent with subsegmental atelectasis or less likely pneumonia.  IMPRESSION: Moderate left hiatal hernia. Curvilinear density seen in right lung base consistent with subsegmental atelectasis or possibly pneumonia.   Electronically Signed   By: Mills  Green M.D.   On: 05/01/2013 21:30    Review of Systems  Constitutional: Negative for fever and diaphoresis.  HENT: Negative for congestion and sore throat.   Respiratory: Negative for cough and shortness of breath.   Cardiovascular: Positive for chest pain. Negative for orthopnea, leg swelling and PND.  Gastrointestinal: Positive for nausea and vomiting. Negative for abdominal pain, diarrhea, constipation, blood in stool and melena.  Genitourinary: Negative for dysuria and hematuria.  Musculoskeletal: Positive for back pain.  Neurological: Negative for dizziness.  All other systems  reviewed and are negative.   Blood pressure 102/60, pulse 75, temperature 98 F (36.7 C), temperature source Oral, resp. rate 20, height 6' 2" (1.88 m), weight 169 lb 1.5 oz (76.7 kg), SpO2 92.00%. Physical Exam  Nursing note and vitals reviewed. Constitutional: He is oriented to person, place, and time. He appears well-developed and well-nourished. No distress.  HENT:  Head: Normocephalic and atraumatic.  Eyes: EOM are normal. Pupils are equal, round, and reactive to light. No scleral icterus.  Neck: Normal range of motion. Neck supple. No JVD present.  Cardiovascular: Normal rate, regular rhythm, S1 normal and S2 normal.   No murmur heard. Pulses:      Radial pulses are 2+ on the right side, and 2+ on the left side.       Dorsalis pedis pulses are 2+ on the right side, and 1+ on the left side.       Posterior tibial pulses are 2+ on the right side, and 2+ on the left side.  Left carotid bruit.  Respiratory: Effort normal and breath sounds normal. He has no wheezes. He has no rales. He exhibits no tenderness.  GI: Soft. He exhibits no distension. There is no tenderness.  Musculoskeletal: He exhibits no edema.  No LEE   Lymphadenopathy:      He has no cervical adenopathy.  Neurological: He is alert and oriented to person, place, and time. He exhibits normal muscle tone.  Skin: Skin is warm and dry.  Psychiatric: He has a normal mood and affect.    Assessment/Plan: Principal Problem:   Chest pain, atypical Active Problems:   Ischemic cardiomyopathy   Chronic systolic congestive heart failure   Atrial fibrillation   Automatic implantable cardiac defibrillator in situ   Anemia, iron deficiency  Plan:  His symptoms as described are concerning.  Troponin however, is negative.  EKG is V paced.  He did have PCI in March 2012 to the SVG to RCA for in-stent restenosis.  I would recommend repeating NST as inpatient.  The patient already ate today.  HAGER, BRYAN 05/02/2013, 9:05 AM     Patient seen and examined. Agree with assessment and plan. Very pleasant 79 yo WM who is 26 yrs s/p CABG with a history of an ischemic cardiomyopathy, hypertension with EF in 2013 40-45%. He is s/p PCI to SVG to RCA 21/2 yrs ago. He tells me that his symptome yesterday were very similar to his heart attack pain in the past. ECG is nondiagnostic secondary to paced rhythm.  I would favor cath for definitive evaluation. Discussed at length with patient who agrees to proceed. Renal fxn stable with Cr .91.   Kirat Mezquita A. Roshon Duell, MD, FACC 05/02/2013 6:28 PM    

## 2013-05-03 NOTE — Interval H&P Note (Signed)
History and Physical Interval Note:  05/03/2013 4:49 PM  Dakota Liu  has presented today for surgery, with the diagnosis of cp  The various methods of treatment have been discussed with the patient and family. After consideration of risks, benefits and other options for treatment, the patient has consented to  Procedure(s): LEFT HEART CATHETERIZATION WITH CORONARY/GRAFT ANGIOGRAM (N/A) as a surgical intervention .  The patient's history has been reviewed, patient examined, no change in status, stable for surgery.  I have reviewed the patient's chart and labs.  Questions were answered to the patient's satisfaction.   Cath Lab Visit (complete for each Cath Lab visit)  Clinical Evaluation Leading to the Procedure:   ACS: yes  Non-ACS:    Anginal Classification: CCS III  Anti-ischemic medical therapy: Minimal Therapy (1 class of medications)  Non-Invasive Test Results: No non-invasive testing performed  Prior CABG: Previous CABG        Theron Arista [facility] 05/03/2013 4:49 PM

## 2013-05-04 DIAGNOSIS — I472 Ventricular tachycardia: Secondary | ICD-10-CM

## 2013-05-04 DIAGNOSIS — I10 Essential (primary) hypertension: Secondary | ICD-10-CM

## 2013-05-04 DIAGNOSIS — I2589 Other forms of chronic ischemic heart disease: Secondary | ICD-10-CM

## 2013-05-04 DIAGNOSIS — I200 Unstable angina: Secondary | ICD-10-CM

## 2013-05-04 LAB — CBC
HCT: 30.8 % — ABNORMAL LOW (ref 39.0–52.0)
Hemoglobin: 10 g/dL — ABNORMAL LOW (ref 13.0–17.0)
MCH: 27.8 pg (ref 26.0–34.0)
MCV: 85.6 fL (ref 78.0–100.0)
Platelets: 167 10*3/uL (ref 150–400)
RBC: 3.6 MIL/uL — ABNORMAL LOW (ref 4.22–5.81)

## 2013-05-04 LAB — BASIC METABOLIC PANEL
CO2: 27 mEq/L (ref 19–32)
Calcium: 8.3 mg/dL — ABNORMAL LOW (ref 8.4–10.5)
Chloride: 98 mEq/L (ref 96–112)
Glucose, Bld: 135 mg/dL — ABNORMAL HIGH (ref 70–99)
Potassium: 2.9 mEq/L — ABNORMAL LOW (ref 3.5–5.1)
Sodium: 135 mEq/L (ref 135–145)

## 2013-05-04 LAB — MAGNESIUM: Magnesium: 2.1 mg/dL (ref 1.5–2.5)

## 2013-05-04 MED ORDER — POTASSIUM CHLORIDE CRYS ER 20 MEQ PO TBCR: 40.0000 meq | EXTENDED_RELEASE_TABLET | Freq: Once | ORAL | Status: DC

## 2013-05-04 MED ORDER — METOPROLOL SUCCINATE ER 50 MG PO TB24: 50.0000 mg | ORAL_TABLET | Freq: Every day | ORAL | Status: DC

## 2013-05-04 MED ORDER — RANOLAZINE ER 500 MG PO TB12
500.0000 mg | ORAL_TABLET | Freq: Two times a day (BID) | ORAL | Status: DC
  Filled 2013-05-04 (×2): qty 1

## 2013-05-04 MED ORDER — RANOLAZINE ER 500 MG PO TB12: 500.0000 mg | ORAL_TABLET | Freq: Two times a day (BID) | ORAL | Status: DC

## 2013-05-04 MED ORDER — POTASSIUM CHLORIDE CRYS ER 20 MEQ PO TBCR: 20.0000 meq | EXTENDED_RELEASE_TABLET | Freq: Every day | ORAL | Status: DC

## 2013-05-04 MED ORDER — VALSARTAN 160 MG PO TABS: 160.0000 mg | ORAL_TABLET | Freq: Every day | ORAL | Status: DC

## 2013-05-04 NOTE — Discharge Summary (Signed)
DISCHARGE SUMMARY  Dakota Liu  MR#: [id number]  DOB:Jun 07, 1928  Date of Admission: 05/01/2013 Date of Discharge: 05/04/2013  Attending Physician:MCCLUNG,JEFFREY T  Patient's NWG:NFAOZH,YQMV Sherilyn Cooter, MD  Consults: Marykay Lex, MD - Cardiology  Disposition: D/C Home w/ wife   Follow-up Appts:     Follow-up Information   Follow up with Pamelia Hoit, MD. Schedule an appointment as soon as possible for a visit in 5 days.   Specialty:  Family Medicine   Contact information:   [address] [place] [address] [phone]       Call Thurmon Fair, MD. (call to determine when Dr. Salena Saner needs to see you in follow up)    Specialty:  Cardiology   Contact information:   [address] [place] [address] [phone]      Tests Needing Follow-up: -) recheck of K+ is suggested  -) recheck of BP is suggested   Discharge Diagnoses: Present on Admission:  . Unstable angina - cardiac evaluation unrevealing . Refractory Hypokalemia due to diuretic therapy  . Ischemic cardiomyopathy- EF improved to 40-45% 10/14 . Chronic systolic congestive heart failure . S/P Biv ICD 12/11 . PAF - 12/11- (no Coumadin secondary to unsteady gait) . Anemia, iron deficiency . 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 CAD - CABG '88, RCA Stent 12/11, ISR rx'd with HSRA 3/12; patent grafts on cath 10/14 . LBBB (left bundle branch block) . PVD (peripheral vascular disease)- AAA R&G Nov 2010 . Dyslipidemia  Initial presentation: 79 yo male h/o cabd in 1986, AAA repair, pafib, icm ef 30% who cames in with chest pain that radiated across right and left lower part of chest relieved with 2 ntg sl tablets. No n/v/d. No fevers. No sob or diaphoresis with this. He does not normally have angina, has not had to take a ntg pill at home in over 6 months. No coughing. No abd pain. No le edema or swelling. Marland Kitchen  Hospital Course:  Chest pain, atypical  -Troponins  negative -Cardiology consulted and completed cath with results as follows: 1. Severe 3 vessel occlusive CAD 2. All grafts are patent including LIMA to the LAD, SVG to the diagonal, SVG sequentially to OM1/OM2/OM3, and SVG to the RCA. The distal RCA is patent at the prior stent site 3. Severe LV dysfunction Recommendations: There is no new disease to explain his recent chest pain symptoms. Recommend continued medical therapy  -Continue aspirin, clopidogrel, metoprolol, nitroglycerin  -Cards added Ranexa and increased Toprol to 50mg  QD -Diovan dose decreased and HCTX component d/c due to refractory hypokalemia   Ischemic cardiomyopathy with Chronic systolic congestive heart failure  -EF 40-40% - well compensated -At time of d/c does not appear to be over volume overloaded.   Parox Atrial fibrillation  -Currently paced no medication at this time.   Anemia, iron deficiency  -Hemoglobin 11.3. Currently no active signs of bleeding. Will continue to monitor CBC.   Refractory Hypokalemia Due to lasix + HCTZ - stopped HCTZ - cont K supplementation    Medication List    STOP taking these medications       valsartan-hydrochlorothiazide 320-12.5 MG per tablet  Commonly known as:  DIOVAN-HCT      TAKE these medications       aspirin 81 MG tablet  Take 81 mg by mouth daily.     clopidogrel 75 MG tablet  Commonly known as:  PLAVIX  Take 75 mg by mouth daily.     furosemide  20 MG tablet  Commonly known as:  LASIX  Take 20 mg by mouth daily.     metoprolol succinate 50 MG 24 hr tablet  Commonly known as:  TOPROL-XL  Take 1 tablet (50 mg total) by mouth daily. Take with or immediately following a meal.  Start taking on:  05/05/2013     nitroGLYCERIN 0.4 MG SL tablet  Commonly known as:  NITROSTAT  Place 0.4 mg under the tongue every 5 (five) minutes as needed.     omeprazole 20 MG capsule  Commonly known as:  PRILOSEC  Take 20 mg by mouth daily.     potassium chloride SA 20  MEQ tablet  Commonly known as:  K-DUR,KLOR-CON  TAKE 1 TABLET BY MOUTH EVERY DAY     ranolazine 500 MG 12 hr tablet  Commonly known as:  RANEXA  Take 1 tablet (500 mg total) by mouth 2 (two) times daily.     simvastatin 40 MG tablet  Commonly known as:  ZOCOR  Take 20 mg by mouth at bedtime.     valsartan 160 MG tablet  Commonly known as:  DIOVAN  Take 1 tablet (160 mg total) by mouth daily.       Day of Discharge BP 117/60  Pulse 75  Temp(Src) 98.3 F (36.8 C) (Oral)  Resp 18  Ht 6\' 2"  (1.88 m)  Wt 76.3 kg (168 lb 3.4 oz)  BMI 21.59 kg/m2  SpO2 94%  Physical Exam: General: No acute respiratory distress Lungs: Clear to auscultation bilaterally without wheezes or crackles Cardiovascular: Regular rate and rhythm without murmur gallop or rub normal S1 and S2 Abdomen: Nontender, nondistended, soft, bowel sounds positive, no rebound, no ascites, no appreciable mass Extremities: No significant cyanosis, clubbing, or edema bilateral lower extremities  Results for orders placed during the hospital encounter of 05/01/13 (from the past 24 hour(s))  CBC     Status: Abnormal   Collection Time    05/03/13  9:48 PM      Result Value Range   WBC 4.5  4.0 - 10.5 K/uL   RBC 3.46 (*) 4.22 - 5.81 MIL/uL   Hemoglobin 9.7 (*) 13.0 - 17.0 g/dL   HCT 96.0 (*) 45.4 - 09.8 %   MCV 84.1  78.0 - 100.0 fL   MCH 28.0  26.0 - 34.0 pg   MCHC 33.3  30.0 - 36.0 g/dL   RDW 11.9 (*) 14.7 - 82.9 %   Platelets 140 (*) 150 - 400 K/uL  CREATININE, SERUM     Status: Abnormal   Collection Time    05/03/13  9:48 PM      Result Value Range   Creatinine, Ser 1.02  0.50 - 1.35 mg/dL   GFR calc non Af Amer 66 (*) >90 mL/min   GFR calc Af Amer 76 (*) >90 mL/min  BASIC METABOLIC PANEL     Status: Abnormal   Collection Time    05/04/13  9:11 AM      Result Value Range   Sodium 135  135 - 145 mEq/L   Potassium 2.9 (*) 3.5 - 5.1 mEq/L   Chloride 98  96 - 112 mEq/L   CO2 27  19 - 32 mEq/L   Glucose, Bld  135 (*) 70 - 99 mg/dL   BUN 17  6 - 23 mg/dL   Creatinine, Ser 5.62  0.50 - 1.35 mg/dL   Calcium 8.3 (*) 8.4 - 10.5 mg/dL   GFR calc non Af Amer 77 (*) >  90 mL/min   GFR calc Af Amer 89 (*) >90 mL/min  CBC     Status: Abnormal   Collection Time    05/04/13  9:11 AM      Result Value Range   WBC 4.3  4.0 - 10.5 K/uL   RBC 3.60 (*) 4.22 - 5.81 MIL/uL   Hemoglobin 10.0 (*) 13.0 - 17.0 g/dL   HCT 16.1 (*) 09.6 - 04.5 %   MCV 85.6  78.0 - 100.0 fL   MCH 27.8  26.0 - 34.0 pg   MCHC 32.5  30.0 - 36.0 g/dL   RDW 40.9 (*) 81.1 - 91.4 %   Platelets 167  150 - 400 K/uL  MAGNESIUM     Status: None   Collection Time    05/04/13  9:11 AM      Result Value Range   Magnesium 2.1  1.5 - 2.5 mg/dL    Time spent in discharge (includes decision making & examination of pt): >30 minutes  05/04/2013, 12:02 PM   Lonia Blood, MD [facility] Office  [phone] Pager [phone]  On-Call/Text Page:      [email or web address]      password [facility]

## 2013-05-04 NOTE — Progress Notes (Signed)
Subjective: No further chest pain.  No complaints.   Objective: Vital signs in last 24 hours: Temp:  [97.4 F (36.3 C)-98.5 F (36.9 C)] 98.3 F (36.8 C) (10/29 0802) Pulse Rate:  [16-85] 75 (10/29 0802) Resp:  [16-20] 18 (10/29 0802) BP: (116-139)/(52-84) 117/60 mmHg (10/29 0802) SpO2:  [91 %-96 %] 94 % (10/29 0802) Weight:  [168 lb 3.4 oz (76.3 kg)] 168 lb 3.4 oz (76.3 kg) (10/29 0049) Last BM Date: 05/04/13  Intake/Output from previous day: 10/28 0701 - 10/29 0700 In: 95.4 [I.V.:95.4] Out: 700 [Urine:700] Intake/Output this shift:    Medications Current Facility-Administered Medications  Medication Dose Route Frequency Provider Last Rate Last Dose  . 0.9 %  sodium chloride infusion  250 mL Intravenous PRN Haydee Monica, MD      . aspirin chewable tablet 81 mg  81 mg Oral Daily Haydee Monica, MD   81 mg at 05/03/13 0942  . clopidogrel (PLAVIX) tablet 75 mg  75 mg Oral Q breakfast Haydee Monica, MD   75 mg at 05/04/13 0818  . furosemide (LASIX) tablet 20 mg  20 mg Oral Daily Haydee Monica, MD   20 mg at 05/03/13 9604  . heparin injection 5,000 Units  5,000 Units Subcutaneous Q8H Peter M [place], MD      . metoprolol succinate (TOPROL-XL) 24 hr tablet 25 mg  25 mg Oral Daily Haydee Monica, MD   25 mg at 05/03/13 0942  . nitroGLYCERIN (NITROGLYN) 2 % ointment 0.5 inch  0.5 inch Topical Q6H Garlon Hatchet, PA-C   0.5 inch at 05/04/13 0556  . nitroGLYCERIN (NITROSTAT) SL tablet 0.4 mg  0.4 mg Sublingual Q5 Min x 3 PRN Garlon Hatchet, PA-C   0.4 mg at 05/01/13 2225  . ondansetron (ZOFRAN) tablet 4 mg  4 mg Oral Q6H PRN Haydee Monica, MD       Or  . ondansetron (ZOFRAN) injection 4 mg  4 mg Intravenous Q6H PRN Haydee Monica, MD      . pantoprazole (PROTONIX) EC tablet 40 mg  40 mg Oral Daily Haydee Monica, MD   40 mg at 05/03/13 0945  . potassium chloride SA (K-DUR,KLOR-CON) CR tablet 40 mEq  40 mEq Oral Once Luke K Kilroy, PA-C      . potassium chloride SA  (K-DUR,KLOR-CON) CR tablet 40 mEq  40 mEq Oral Once [employer], DO      . simvastatin (ZOCOR) tablet 20 mg  20 mg Oral QHS Haydee Monica, MD   20 mg at 05/03/13 2122  . sodium chloride 0.9 % injection 3 mL  3 mL Intravenous Q12H Haydee Monica, MD   3 mL at 05/03/13 0945  . sodium chloride 0.9 % injection 3 mL  3 mL Intravenous Q12H Haydee Monica, MD   3 mL at 05/03/13 2122  . sodium chloride 0.9 % injection 3 mL  3 mL Intravenous PRN Haydee Monica, MD        PE: General appearance: alert, cooperative and no distress Lungs: decreased breath sounds bilaterally Heart: regular rate and rhythm, S1, S2 normal, no murmur, click, rub or gallop Extremities: no LEE Pulses: 2+ and symmetric Skin: warm and dry Neurologic: Grossly normal  Lab Results:   Recent Labs  05/01/13 2040 05/03/13 2148  WBC 11.3* 4.5  HGB 11.5* 9.7*  HCT 35.1* 29.1*  PLT 189 140*   BMET  Recent Labs  05/01/13 2040 05/03/13 0820  05/03/13 2148  NA 134* 134*  --   K 3.3* 3.3*  --   CL 95* 95*  --   CO2 26 27  --   GLUCOSE 176* 103*  --   BUN 14 19  --   CREATININE 0.91 1.03 1.02  CALCIUM 9.2 8.6  --    PT/INR  Recent Labs  05/03/13 0606  LABPROT 16.2*  INR 1.33   Cardiac Panel (last 3 results)  Recent Labs  05/02/13 0158 05/02/13 0705 05/02/13 1326  TROPONINI <0.30 <0.30 <0.30    Studies/Results:  Diagnostic LHC  Final Conclusions:  1. Severe 3 vessel occlusive CAD.  2. All grafts are patent including LIMA to the LAD, SVG to the diagonal, SVG sequentially to OM1/OM2/OM3, and SVG to the RCA. The distal RCA is patent at the prior stent site.  3. Severe LV dysfunction.  Recommendations: There is no new disease to explain his recent chest pain symptoms. Recommend continued medical therapy.    Assessment/Plan  Principal Problem:   Unstable angina Active Problems:   Ischemic cardiomyopathy- EF improved to 40-45% 10/14   Chronic systolic congestive heart failure   PAF - 12/11-  (no Coumadin secondary to unsteady gait)   S/P Biv ICD 12/11   Follicular lymphoma of head, face, and neck-'06   Anemia, iron deficiency   CAD - CABG '88, RCA Stent 12/11, ISR rx'd with HSRA 3/12; patent grafts on cath 10/14   LBBB (left bundle branch block)   PVD (peripheral vascular disease)- AAA R&G Nov 2010   Dyslipidemia   VT (ventricular tachycardia) - 12/11  Plan: s/p diagnostic LHC revealing no new disease to explain his recent CP. All grafts were patent including LIMA-LAD, SVG to the diagonal, SVG sequentially to OM1/OM2/OM3, and SVG to the RCA. The distal RCA is patent at the prior stent site. EF 30% (has an ICD). Left radial access site is stable. No further chest pain. However, will add Ranexa. Already on a BB. BMP and CBC ordered to f/u potassium and hgb. Have pt ambulate the halls today. If stable, can be discharged home today, from a cardiovascular standpoint. He will need to increase his Toprol to 50 mg daily. Cut Diovan-HCT dose in half.    LOS: 3 days   Dakota Liu 05/04/2013 8:46 AM  I have seen and evaluated the patient this AM along with Boyce Medici, PA, or Wilburt Finlay, [place]. I agree with her findings, examination as well as impression recommendations.  No further CP or SOB today -- not sure if episode of CP was truly anginal,but no new obstructive CAD on cath.  Plan is Med Rx -- will add Ranexa  With run of NSVT - will increase BB dose & cut ARB-HCTZ in 1/2.    Otherwise doing well.  BP & HR stable.  Is essentially ready for d/c from cardiac perspective.  Is ambulating in hall without difficulty.   Marykay Lex, M.D., M.S. [facility] [address] [place], [place]  [address]  [phone] Pager # [phone] 05/04/2013 9:47 AM

## 2013-05-04 NOTE — Progress Notes (Signed)
Pt given discharge AVS and gone over with RN. No questions at this time. Family (wife) present as well - all belongings given to pt at time of discharge. Volunteer wheelchair called to take pt to car.    Delynn Flavin, RN

## 2013-05-10 ENCOUNTER — Telehealth: Payer: Self-pay | Admitting: Cardiovascular Disease

## 2013-05-10 NOTE — Telephone Encounter (Signed)
Dr. Herbie Baltimore notified and advised pt needs to be seen 1-2 weeks for hospital f/u with Extender.  Returned call.  Left message to call back before 4pm and appt should be 1-2 weeks from discharge.  Scheduler can set up appt if needed.

## 2013-05-10 NOTE — Telephone Encounter (Signed)
Needs to schedule

## 2013-05-10 NOTE — Telephone Encounter (Signed)
Returned call and informed wife per instructions by MD.  Verbalized understanding and agreed w/ plan.  Requested appt next week and scheduled for 11.12.14 at 2 pm w/ Corine Shelter, PA-C.

## 2013-05-10 NOTE — Telephone Encounter (Signed)
Got out of hospital last Wednesday.  Was given instructions to call and see if Dr C wanted to see him to follow up.  Mrs did not want to make an appt until someone called her back to see if she was supposed to make appt for Dakota Liu  Please call

## 2013-05-15 NOTE — Assessment & Plan Note (Signed)
Currently asymptomatic. Low risk myocardium perfusion study in April 2013 with an extensive scar involving the entire LAD distribution.

## 2013-05-15 NOTE — Progress Notes (Signed)
Patient ID: Dakota Liu, male   DOB: Sep 04, 1927, 79 y.o.   MRN: [id number]      Reason for office visit Followup biventricular ICD; ischemic cardiomyopathy, congestive heart, works as later fibrillation  Dakota Liu is a 79 y.o. male with a history of coronary artery disease now more than 25 years after bypass surgery. He has ischemic cardiomyopathy with severely depressed left ventricular systolic function, s/p biventricular defibrillator with good response to CRT, hypertension, hyperlipidemia and remote history of lymphoma. Patient wears braces on his ankles due to being unsteady on his feet. Last 2-D echocardiogram was April 2013 showed an ejection fraction of 40-45%, impared LV relaxation, severe anterior and apical hypokinesis.   He was last seen in our office in July when he had complaints of dark urine, nocturnal diaphoresis and fatigue. His urinalysis was unremarkable. The problems resolved spontaneously.  Left ventricular ejection fraction is around 40-45% by echocardiography, with a pattern of old anteroapical infarction, unchanged from before. He has a history of iron deficiency anemia and his hemoglobin oscillated between 9.7 and 11.5 with normocytic indices. He does not take warfarin but is on aspirin and clopidogrel.  Most recent coronary procedures were stenting of the distal right coronary artery and 2011 and then cutting balloon angioplasty for in-stent restenosis in 2012  This point in time he is completely asymptomatic    Allergies  Allergen Reactions  . Tramadol Nausea And Vomiting and Other (See Comments)    Dry heaving, GI upset  . Ace Inhibitors Cough  . Simvastatin     Myalgia- high doses (takes 20mg  at home without issue)    Current Outpatient Prescriptions  Medication Sig Dispense Refill  . aspirin 81 MG tablet Take 81 mg by mouth daily.      . clopidogrel (PLAVIX) 75 MG tablet Take 75 mg by mouth daily.        . furosemide (LASIX) 20 MG tablet Take  20 mg by mouth daily.       . nitroGLYCERIN (NITROSTAT) 0.4 MG SL tablet Place 0.4 mg under the tongue every 5 (five) minutes as needed.        Marland Kitchen omeprazole (PRILOSEC) 20 MG capsule Take 20 mg by mouth daily.        . potassium chloride SA (K-DUR,KLOR-CON) 20 MEQ tablet TAKE 1 TABLET BY MOUTH EVERY DAY  90 tablet  3  . simvastatin (ZOCOR) 40 MG tablet Take 20 mg by mouth at bedtime.       . metoprolol succinate (TOPROL-XL) 50 MG 24 hr tablet Take 1 tablet (50 mg total) by mouth daily. Take with or immediately following a meal.  30 tablet  0  . ranolazine (RANEXA) 500 MG 12 hr tablet Take 1 tablet (500 mg total) by mouth 2 (two) times daily.  60 tablet  0  . valsartan (DIOVAN) 160 MG tablet Take 1 tablet (160 mg total) by mouth daily.  30 tablet  0   No current facility-administered medications for this visit.    Past Medical History  Diagnosis Date  . HTN (hypertension)   . Dyslipidemia   . Lymphoma     with metal radiation about 2006  . Epididymitis   . PAF (paroxysmal atrial fibrillation) 12/11  . Anemia, iron deficiency 11/21/2011  . CAD (coronary artery disease) '88, 12/11, 3/12    CABG'88, PCI 12/1, 3/12  . Ischemic cardiomyopathy 10/14    EF improved to 40-45% after BiV ICD  . CHF (congestive heart failure)   .  VT (ventricular tachycardia) 12/11  . PVD (peripheral vascular disease) 11/10    AAA R&G  . LBBB (left bundle branch block)     Past Surgical History  Procedure Laterality Date  . Tonsillectomy    . Orchiectomy    . Coronary artery bypass graft  1988  . Abdominal aortic aneurysm repair  11/10  . Cardiac defibrillator placement  12/11  . Coronary angioplasty with stent placement  07/05/10, 3/12    RCA with ISR 3/12    Family History  Problem Relation Age of Onset  . Diabetes Mother   . Heart Problems Mother   . Stroke Father   . Cancer Sister     History   Social History  . Marital Status: Married    Spouse Name: N/A    Number of Children: N/A  .  Years of Education: N/A   Occupational History  . Not on file.   Social History Main Topics  . Smoking status: Former Smoker -- 1.00 packs/day for 44 years    Types: Cigarettes    Quit date: 07/07/1984  . Smokeless tobacco: Never Used  . Alcohol Use: No  . Drug Use: No  . Sexual Activity: Not on file   Other Topics Concern  . Not on file   Social History Narrative  . No narrative on file    Review of systems: The patient specifically denies any chest pain at rest or with exertion, dyspnea at rest or with exertion, orthopnea, paroxysmal nocturnal dyspnea, syncope, palpitations, focal neurological deficits, intermittent claudication, lower extremity edema, unexplained weight gain, cough, hemoptysis or wheezing.  The patient also denies abdominal pain, nausea, vomiting, dysphagia, diarrhea, constipation, polyuria, polydipsia, dysuria, hematuria, frequency, urgency, abnormal bleeding or bruising, fever, chills, unexpected weight changes, mood swings, change in skin or hair texture, change in voice quality, auditory or visual problems, allergic reactions or rashes, new musculoskeletal complaints other than usual "aches and pains".   PHYSICAL EXAM BP 112/62  Pulse 92  Ht 6\' 2"  (1.88 m)  Wt 175 lb 3.2 oz (79.47 kg)  BMI 22.48 kg/m2  General: Alert, oriented x3, no distress Head: no evidence of trauma, PERRL, EOMI, no exophtalmos or lid lag, no myxedema, no xanthelasma; normal ears, nose and oropharynx Neck: normal jugular venous pulsations and no hepatojugular reflux; brisk carotid pulses without delay and no carotid bruits Chest: clear to auscultation, no signs of consolidation by percussion or palpation, normal fremitus, symmetrical and full respiratory excursions; the defibrillator is a Cardiovascular: normal position and quality of the apical impulse, regular rhythm, normal first and paradoxically split second heart sounds, rate 1/6 flow systolic murmur at the left lower sternal  border, no rubs or gallops Abdomen: Midline laparotomy scar; no tenderness or distention, no masses by palpation, no abnormal pulsatility or arterial bruits, normal bowel sounds, no hepatosplenomegaly Extremities: no clubbing, cyanosis or edema; 2+ radial, ulnar and brachial pulses bilaterally; 2+ right femoral, posterior tibial and dorsalis pedis pulses; 2+ left femoral, posterior tibial and dorsalis pedis pulses; no subclavian or femoral bruits Neurological: grossly nonfocal   EKG: Atrial sensed (sinus rhythm with PACs) biventricular paced rhythm with an occasional PVC  Lipid Panel     Component Value Date/Time   CHOL  Value: 164        ATP III CLASSIFICATION:  <200     mg/dL   Desirable  161-096  mg/dL   Borderline High  >=045    mg/dL   High  07/02/2010 0825   TRIG 115 07/02/2010 0825   HDL 28* 07/02/2010 0825   CHOLHDL 5.9 07/02/2010 0825   VLDL 23 07/02/2010 0825   LDLCALC  Value: 113        Total Cholesterol/HDL:CHD Risk Coronary Heart Disease Risk Table                     Men   Women  1/2 Average Risk   3.4   3.3  Average Risk       5.0   4.4  2 X Average Risk   9.6   7.1  3 X Average Risk  23.4   11.0        Use the calculated Patient Ratio above and the CHD Risk Table to determine the patient's CHD Risk.        ATP III CLASSIFICATION (LDL):  <100     mg/dL   Optimal  161-096  mg/dL   Near or Above                    Optimal  130-159  mg/dL   Borderline  045-409  mg/dL   High  >811     mg/dL   Very High* 91/47/8295 0825    BMET    Component Value Date/Time   NA 135 05/04/2013 0911   NA 141 06/07/2012 0923   K 2.9* 05/04/2013 0911   K 3.5 06/07/2012 0923   CL 98 05/04/2013 0911   CL 105 06/07/2012 0923   CO2 27 05/04/2013 0911   CO2 26 06/07/2012 0923   GLUCOSE 135* 05/04/2013 0911   GLUCOSE 134* 06/07/2012 0923   BUN 17 05/04/2013 0911   BUN 14.0 06/07/2012 0923   CREATININE 0.89 05/04/2013 0911   CREATININE 0.9 06/07/2012 0923   CALCIUM 8.3* 05/04/2013 0911   CALCIUM  8.9 06/07/2012 0923   GFRNONAA 77* 05/04/2013 0911   GFRAA 89* 05/04/2013 0911     ASSESSMENT AND PLAN CAD - CABG '88, RCA Stent 12/11, ISR rx'd with HSRA 3/12; patent grafts on cath 10/14 Currently asymptomatic. Low risk myocardium perfusion study in April 2013 with an extensive scar involving the entire LAD distribution.   Ischemic cardiomyopathy- EF improved to 40-45% 10/14 NYHA class II, clinically euvolemic on beta blocker and ARB therapy. Only needs a low dose of loop diuretic.  S/P Biv ICD 12/11 CRT-D device check in office. All the parameters normal. No mode switch episodes recorded. 4 ventricular arrhythmia episodes recorded---max duration of 45 sec(atrial tachycardia), Max V  178. Maximum duration of true NSVT=13 bts. Patient bi-ventricularly pacing >99% of the time. Optivol normal.  Audible alerts demonstrated for patient. No changes made this session.. Patient enrolled in remote follow up. Plan to check device remotely on 08-01-2013. Patient education completed including "shock plan".  PAF - 12/11- (no Coumadin secondary to unsteady gait) No recent events by pacemaker check.   Orders Placed This Encounter  Procedures  . ICD Device Observation  . EKG 12-Lead   Meds ordered this encounter  Medications  . DISCONTD: valsartan-hydrochlorothiazide (DIOVAN-HCT) 320-12.5 MG per tablet    Sig: Take 1 tablet by mouth daily.    Junious Silk, MD, [facility] [phone] office [phone] pager

## 2013-05-15 NOTE — Assessment & Plan Note (Signed)
NYHA class II, clinically euvolemic on beta blocker and ARB therapy. Only needs a low dose of loop diuretic.

## 2013-05-15 NOTE — Assessment & Plan Note (Addendum)
CRT-D device check in office. All the parameters normal. No mode switch episodes recorded. 4 ventricular arrhythmia episodes recorded---max duration of 45 sec(atrial tachycardia), Max V  178. Maximum duration of true NSVT=13 bts. Patient bi-ventricularly pacing >99% of the time. Optivol normal.  Audible alerts demonstrated for patient. No changes made this session.. Patient enrolled in remote follow up. Plan to check device remotely on 08-01-2013. Patient education completed including "shock plan".

## 2013-05-15 NOTE — Assessment & Plan Note (Signed)
No recent events by pacemaker check.

## 2013-05-16 ENCOUNTER — Encounter: Payer: Self-pay | Admitting: Cardiovascular Disease

## 2013-05-18 ENCOUNTER — Ambulatory Visit: Payer: Medicare Other | Admitting: Cardiology

## 2013-05-31 ENCOUNTER — Ambulatory Visit (INDEPENDENT_AMBULATORY_CARE_PROVIDER_SITE_OTHER): Payer: Medicare Other | Admitting: Cardiology

## 2013-05-31 ENCOUNTER — Encounter: Payer: Self-pay | Admitting: Cardiology

## 2013-05-31 VITALS — BP 120/60 | HR 74 | Ht 74.0 in | Wt 171.0 lb

## 2013-05-31 DIAGNOSIS — I2589 Other forms of chronic ischemic heart disease: Secondary | ICD-10-CM

## 2013-05-31 DIAGNOSIS — I4891 Unspecified atrial fibrillation: Secondary | ICD-10-CM

## 2013-05-31 DIAGNOSIS — I255 Ischemic cardiomyopathy: Secondary | ICD-10-CM

## 2013-05-31 DIAGNOSIS — Z95810 Presence of automatic (implantable) cardiac defibrillator: Secondary | ICD-10-CM

## 2013-05-31 DIAGNOSIS — N390 Urinary tract infection, site not specified: Secondary | ICD-10-CM | POA: Insufficient documentation

## 2013-05-31 DIAGNOSIS — I480 Paroxysmal atrial fibrillation: Secondary | ICD-10-CM

## 2013-05-31 DIAGNOSIS — I2510 Atherosclerotic heart disease of native coronary artery without angina pectoris: Secondary | ICD-10-CM

## 2013-05-31 NOTE — Assessment & Plan Note (Signed)
No angina 

## 2013-05-31 NOTE — Progress Notes (Signed)
05/31/2013 Dakota Liu   June 17, 1928  [id number]  Primary Physicia Pamelia Hoit, MD Primary Cardiologist: Dr Royann Shivers  HPI:  79 y/o with a history of CAD, s/p CABG in '88 with subsequent PCI 12/11, 3/12. He was admitted 10/26-10/29/14 with [place]. Cath revealed patent grafts. The distal RCA PCI site was patent. His medications were adjusted. He has know LVD and is s/p MDT BiV 12/11. His EF by echo 05/02/13 was 40-45% so he appears to be a responder. He was to return for an office visit but he developed a UTI and was unable to keep that appointment. This was treated and he is here for follow up. He is doing well, no angina. He uses a walker and is not felt to be a candidate for Coumadin. He was in NSR on his recent admission.    Current Outpatient Prescriptions  Medication Sig Dispense Refill  . aspirin 81 MG tablet Take 81 mg by mouth daily.      . ciprofloxacin (CIPRO) 500 MG tablet       . clopidogrel (PLAVIX) 75 MG tablet Take 75 mg by mouth daily.        . furosemide (LASIX) 20 MG tablet Take 20 mg by mouth daily.       . metoprolol succinate (TOPROL-XL) 50 MG 24 hr tablet Take 1 tablet (50 mg total) by mouth daily. Take with or immediately following a meal.  30 tablet  0  . nitroGLYCERIN (NITROSTAT) 0.4 MG SL tablet Place 0.4 mg under the tongue every 5 (five) minutes as needed.        Marland Kitchen omeprazole (PRILOSEC) 20 MG capsule Take 20 mg by mouth daily.        . potassium chloride SA (K-DUR,KLOR-CON) 20 MEQ tablet TAKE 1 TABLET BY MOUTH EVERY DAY  90 tablet  3  . ranolazine (RANEXA) 500 MG 12 hr tablet Take 1 tablet (500 mg total) by mouth 2 (two) times daily.  60 tablet  0  . simvastatin (ZOCOR) 40 MG tablet Take 20 mg by mouth at bedtime.       . tamsulosin (FLOMAX) 0.4 MG CAPS capsule       . valsartan (DIOVAN) 160 MG tablet Take 1 tablet (160 mg total) by mouth daily.  30 tablet  0   No current facility-administered medications for this visit.    Allergies  Allergen Reactions   . Tramadol Nausea And Vomiting and Other (See Comments)    Dry heaving, GI upset  . Ace Inhibitors Cough  . Simvastatin     Myalgia- high doses (takes 20mg  at home without issue)    History   Social History  . Marital Status: Married    Spouse Name: N/A    Number of Children: N/A  . Years of Education: N/A   Occupational History  . Not on file.   Social History Main Topics  . Smoking status: Former Smoker -- 1.00 packs/day for 44 years    Types: Cigarettes    Quit date: 07/07/1984  . Smokeless tobacco: Never Used  . Alcohol Use: No  . Drug Use: No  . Sexual Activity: Not on file   Other Topics Concern  . Not on file   Social History Narrative  . No narrative on file     Review of Systems: General: negative for chills, fever, night sweats or weight changes.  Cardiovascular: negative for chest pain, dyspnea on exertion, edema, orthopnea, palpitations, paroxysmal nocturnal dyspnea or shortness of breath Dermatological: negative  for rash Respiratory: negative for cough or wheezing Urologic: negative for hematuria Abdominal: negative for nausea, vomiting, diarrhea, bright red blood per rectum, melena, or hematemesis. Positive for constipation with recent medication change (? Ranexa) Neurologic: negative for visual changes, syncope, or dizziness All other systems reviewed and are otherwise negative except as noted above.    Blood pressure 120/60, pulse 74, height 6\' 2"  (1.88 m), weight 171 lb (77.565 kg).  General appearance: alert, cooperative and no distress Lungs: clear to auscultation bilaterally Heart: regular rate and rhythm    ASSESSMENT AND PLAN:   CAD - CABG '88, RCA Stent 12/11, ISR rx'd with HSRA 3/12; patent grafts on cath 10/14 No angina  Ischemic cardiomyopathy- EF improved to 40-45% 10/14 No CHF  S/P Biv ICD 12/11 MDT 12/11  PAF - 12/11- (no Coumadin secondary to unsteady gait) .  UTI (urinary tract infection) Post hospital UTI- Rx'd with  Cipro, last dose today   PLAN  No medication changes today. I did suggest he try taking Metamucil daily for his constipation. He can see Dr Royann Shivers in 3 months.  He was hypokalemic in the the hospital and had labs at Dr Tawana Scale office last week so these will not be repeated.    Donita Newland KPA-C 05/31/2013 2:15 PM

## 2013-05-31 NOTE — Assessment & Plan Note (Signed)
Post hospital UTI- Rx'd with Cipro, last dose today

## 2013-05-31 NOTE — Assessment & Plan Note (Signed)
MDT 12/11

## 2013-05-31 NOTE — Patient Instructions (Signed)
Metamucil 1 tbsp in water daily Your physician recommends that you schedule a follow-up appointment in: 3 months with Dr Royann Shivers

## 2013-05-31 NOTE — Assessment & Plan Note (Signed)
No CHF- 

## 2013-08-01 ENCOUNTER — Ambulatory Visit (INDEPENDENT_AMBULATORY_CARE_PROVIDER_SITE_OTHER): Payer: Medicare HMO | Admitting: *Deleted

## 2013-08-01 DIAGNOSIS — I472 Ventricular tachycardia, unspecified: Secondary | ICD-10-CM

## 2013-08-01 DIAGNOSIS — I5022 Chronic systolic (congestive) heart failure: Secondary | ICD-10-CM

## 2013-08-01 DIAGNOSIS — I480 Paroxysmal atrial fibrillation: Secondary | ICD-10-CM

## 2013-08-01 DIAGNOSIS — I4729 Other ventricular tachycardia: Secondary | ICD-10-CM

## 2013-08-01 DIAGNOSIS — I4891 Unspecified atrial fibrillation: Secondary | ICD-10-CM

## 2013-08-01 DIAGNOSIS — I509 Heart failure, unspecified: Secondary | ICD-10-CM

## 2013-08-31 ENCOUNTER — Ambulatory Visit: Payer: Medicare Other | Admitting: Cardiovascular Disease

## 2013-09-22 ENCOUNTER — Encounter: Payer: Self-pay | Admitting: Cardiovascular Disease

## 2013-09-22 ENCOUNTER — Ambulatory Visit (INDEPENDENT_AMBULATORY_CARE_PROVIDER_SITE_OTHER): Payer: Medicare HMO | Admitting: Cardiovascular Disease

## 2013-09-22 VITALS — BP 130/82 | HR 73 | Resp 20 | Ht 74.0 in | Wt 168.7 lb

## 2013-09-22 DIAGNOSIS — Z79899 Other long term (current) drug therapy: Secondary | ICD-10-CM

## 2013-09-22 DIAGNOSIS — R0602 Shortness of breath: Secondary | ICD-10-CM

## 2013-09-22 DIAGNOSIS — I2510 Atherosclerotic heart disease of native coronary artery without angina pectoris: Secondary | ICD-10-CM

## 2013-09-22 DIAGNOSIS — I255 Ischemic cardiomyopathy: Secondary | ICD-10-CM

## 2013-09-22 DIAGNOSIS — I2589 Other forms of chronic ischemic heart disease: Secondary | ICD-10-CM

## 2013-09-22 DIAGNOSIS — E785 Hyperlipidemia, unspecified: Secondary | ICD-10-CM

## 2013-09-22 LAB — PACEMAKER DEVICE OBSERVATION

## 2013-09-22 MED ORDER — POTASSIUM CHLORIDE CRYS ER 20 MEQ PO TBCR: 20.0000 meq | EXTENDED_RELEASE_TABLET | Freq: Two times a day (BID) | ORAL | Status: DC

## 2013-09-22 MED ORDER — FUROSEMIDE 20 MG PO TABS: 60.0000 mg | ORAL_TABLET | Freq: Every day | ORAL | Status: DC

## 2013-09-22 MED ORDER — METOPROLOL SUCCINATE ER 25 MG PO TB24: 25.0000 mg | ORAL_TABLET | Freq: Every day | ORAL | Status: DC

## 2013-09-22 NOTE — Patient Instructions (Signed)
STOP Flomax.  Decrease Metoprolol ER to 25mg  Daily.  Increase Furosemide to 60mg  daily.  Increase Potassium to 63meq twice a day.  Your physician recommends that you return for lab work in: CMET and BNP (lab work) in 1 week.  Your physician recommends that you schedule a follow-up appointment in: 10 days with Dr. Sallyanne Kuster or an extender.

## 2013-09-22 NOTE — Progress Notes (Signed)
Reason for office visit Follow up chronic cardiac conditions of CAD, ischemic cardiomyopathy with severely depressed left ventricular systolic function s/p defibrillator, HTN and HLD. PMHx also significant for remote lymphoma.  On furosemide 20 mg daily secondary to fluid overload symptoms in October/2013. Wife reports she increased dose to 40 mg daily 6 months ago. He has had 3 episodes in the past month of orthopnea. Wife described them as waking up in the middle of the night with SOB, she had given him Nitroglycerine and symptoms resolved. Last episode she had given him up to 2 doses of nitro in order to control his SOB. Denies chest pain, sweating, increase lower extremity edema or weight gain. During the day he reports only SOB with minimal exertion (walking)  Allergies  Allergen Reactions  . Tramadol Nausea And Vomiting and Other (See Comments)    Dry heaving, GI upset  . Ace Inhibitors Cough  . Simvastatin     Myalgia- high doses (takes 20mg  at home without issue)    Current Outpatient Prescriptions  Medication Sig Dispense Refill  . aspirin 81 MG tablet Take 81 mg by mouth daily.      . clopidogrel (PLAVIX) 75 MG tablet Take 75 mg by mouth daily.        . furosemide (LASIX) 20 MG tablet Take 40 mg by mouth daily.       . metoprolol succinate (TOPROL-XL) 50 MG 24 hr tablet Take 1 tablet (50 mg total) by mouth daily. Take with or immediately following a meal.  30 tablet  0  . nitroGLYCERIN (NITROSTAT) 0.4 MG SL tablet Place 0.4 mg under the tongue every 5 (five) minutes as needed.        Marland Kitchen omeprazole (PRILOSEC) 20 MG capsule Take 20 mg by mouth daily.        . potassium chloride SA (K-DUR,KLOR-CON) 20 MEQ tablet TAKE 1 TABLET BY MOUTH EVERY DAY  90 tablet  3  . ranolazine (RANEXA) 500 MG 12 hr tablet Take 1 tablet (500 mg total) by mouth 2 (two) times daily.  60 tablet  0  . simvastatin (ZOCOR) 40 MG tablet Take 20 mg by mouth at bedtime.       . tamsulosin (FLOMAX) 0.4 MG  CAPS capsule        No current facility-administered medications for this visit.    Past Medical History  Diagnosis Date  . HTN (hypertension)   . Dyslipidemia   . Lymphoma     with metal radiation about 2006  . Epididymitis   . PAF (paroxysmal atrial fibrillation) 12/11  . Anemia, iron deficiency 11/21/2011  . CAD (coronary artery disease) '88, 12/11, 3/12    CABG'88, PCI 12/1, 3/12  . Ischemic cardiomyopathy 10/14    EF improved to 40-45% after BiV ICD  . CHF (congestive heart failure)   . VT (ventricular tachycardia) 12/11  . PVD (peripheral vascular disease) 11/10    AAA R&G  . LBBB (left bundle branch block)     Past Surgical History  Procedure Laterality Date  . Tonsillectomy    . Orchiectomy    . Coronary artery bypass graft  1988  . Abdominal aortic aneurysm repair  11/10  . Cardiac defibrillator placement  12/11  . Coronary angioplasty with stent placement  07/05/10, 3/12    RCA with ISR 3/12    Family History  Problem Relation Age of Onset  . Diabetes Mother   . Heart Problems Mother   . Stroke  Father   . Cancer Sister     History   Social History  . Marital Status: Married    Spouse Name: N/A    Number of Children: N/A  . Years of Education: N/A   Occupational History  . Not on file.   Social History Main Topics  . Smoking status: Former Smoker -- 1.00 packs/day for 44 years    Types: Cigarettes    Quit date: 07/07/1984  . Smokeless tobacco: Never Used  . Alcohol Use: No  . Drug Use: No  . Sexual Activity: Not on file   Other Topics Concern  . Not on file   Social History Narrative  . No narrative on file    Review of systems: Review of Systems  Constitutional: Negative.   HENT: Negative.   Respiratory: Positive for shortness of breath. Negative for cough.   Cardiovascular: Positive for orthopnea. Negative for chest pain, palpitations and leg swelling.  Gastrointestinal: Negative.   Neurological: Negative.    PHYSICAL EXAM BP  130/82  Pulse 73  Ht 6\' 2"  (1.88 m)  Wt 168 lb 11.2 oz (76.522 kg)  BMI 21.65 kg/m2   Gen:  Frail appearing, NAD HEENT: Moist mucous membranes. Neck supple. No JVD. No adenopathies.  CV: Regular rate and rhythm, II/VI systolic murmur more audible at left external border.  Healed sternotomy scar. PULM: Clear to auscultation bilaterally. No wheezes/rales/rhonchi ABD: mid line healed laparotomy scar. Soft, non tender, non distended, normal bowel sounds EXT: Ankle edema present bilateral, right more than left. Neuro: Alert and oriented x3. No gross focalization   EKG: Atrial sensed ventricular sensed rhythm with occasional PVC's  Lipid Panel     Component Value Date/Time   CHOL  Value: 164        ATP III CLASSIFICATION:  <200     mg/dL   Desirable  200-239  mg/dL   Borderline High  >=240    mg/dL   High        07/02/2010 0825   TRIG 115 07/02/2010 0825   HDL 28* 07/02/2010 0825   CHOLHDL 5.9 07/02/2010 0825   VLDL 23 07/02/2010 0825   LDLCALC  Value: 113        Total Cholesterol/HDL:CHD Risk Coronary Heart Disease Risk Table                     Men   Women  1/2 Average Risk   3.4   3.3  Average Risk       5.0   4.4  2 X Average Risk   9.6   7.1  3 X Average Risk  23.4   11.0        Use the calculated Patient Ratio above and the CHD Risk Table to determine the patient's CHD Risk.        ATP III CLASSIFICATION (LDL):  <100     mg/dL   Optimal  100-129  mg/dL   Near or Above                    Optimal  130-159  mg/dL   Borderline  160-189  mg/dL   High  >190     mg/dL   Very High* 07/02/2010 0825    BMET    Component Value Date/Time   NA 135 05/04/2013 0911   NA 141 06/07/2012 0923   K 2.9* 05/04/2013 0911   K 3.5 06/07/2012 0923   CL 98 05/04/2013 0911  CL 105 06/07/2012 0923   CO2 27 05/04/2013 0911   CO2 26 06/07/2012 0923   GLUCOSE 135* 05/04/2013 0911   GLUCOSE 134* 06/07/2012 0923   BUN 17 05/04/2013 0911   BUN 14.0 06/07/2012 0923   CREATININE 0.89 05/04/2013 0911   CREATININE  0.9 06/07/2012 0923   CALCIUM 8.3* 05/04/2013 0911   CALCIUM 8.9 06/07/2012 0923   GFRNONAA 77* 05/04/2013 0911   GFRAA 89* 05/04/2013 0911     ASSESSMENT AND PLAN 1. Moderated to severe  Ischemic cardiomyopathy and CHF with EF estimated in the 30% range: symptomatic on 40 mg of Furosemide.  - will increase Furosemide to 60mg  daily and Kdur to 28mcq daily as well. - Decrease metoprolol to 25mg  daily.  - will check CMET and BNP in one week and will f/o in 10 days.  2. CAD s/p bypass surgery: asymptomatic -continue ASA and plavix  3. HLD - continue statin  4. HTN: controlled  5. Defibrillator was interrogated today.   Orders Placed This Encounter  Procedures  . EKG 12-Lead     PILOTO, DAYARMYS  Sanda Klein, MD, [facility] [phone] office [phone] pager  I have seen and examined the patient along with Gwendolyn Fill, MD.  I have reviewed the chart, notes and new data.  I agree with her note.  KADRIAN MING is a 80 y.o. male with a history of coronary artery disease now more than 25 years after bypass surgery. He has ischemic cardiomyopathy with severely depressed left ventricular systolic function, s/p biventricular defibrillator with good response to CRT, hypertension, hyperlipidemia and remote history of lymphoma. Patient wears braces on his ankles due to being unsteady on his feet. Last 2-D echocardiogram was April 2013 showed an ejection fraction of 40-45%, impared LV relaxation, severe anterior and apical hypokinesis. His nuclear study shows an extensive scar in the distribution of the LAD artery. One would expect a much lower ejection fraction with that type of nuclear scintigram. Indeed,  in October of 2014, Dr. Peter Martinique performed coronary angiography and left ventriculography. The EF was estimated at 30% with akinesis of the mid to distal anterior wall and mid to distal inferior wall and apical dyskinesis. All grafts are patent and the distal  RCA was patent at the stent site. No pertinent history/was performed. Most recent coronary procedures were stenting of the distal right coronary artery and 2011 and then cutting balloon angioplasty for in-stent restenosis in 2012  Has a history of iron deficiency anemia and his hemoglobin oscillated between 9.7 and 11.5 with normocytic indices. He does not take warfarin but is on aspirin and clopidogrel.  Most recent coronary procedures were stenting of the distal right coronary artery and 2011 and then cutting balloon angioplasty for in-stent restenosis in 2012  I believe he is describing paroxysmal nocturnal dyspnea. He also has severe dizziness consistent with severe orthostatic hypotension   Clinical exam is consistent with hypervolemia.  Interrogation of his defibrillator shows reduction in activity level over the last 3 or 4 months, only very brief episodes of atrial arrhythmia and no ventricular arrhythmia, decreasing thoracic impedance consistent with heart failure exacerbation.  Will increase furosemide to 60 mg daily. We'll temporarily decrease metoprolol to 25 mg daily. Note that he has not been taking his valsartan for several months now because of severe symptomatic hypotension. He has recorded blood pressures in the 70s at times. He describes orthostatic hypotension. Will discontinue his tamsulosin to see if this helps. Check labs.  Followup  quickly.  Sanda Klein, MD, [place] [phone] 09/22/2013, 6:00 PM

## 2013-09-23 LAB — MDC_IDC_ENUM_SESS_TYPE_INCLINIC
Battery Voltage: 3.04 V
Brady Statistic AP VS Percent: 0.35 %
Brady Statistic AS VS Percent: 1.66 %
Brady Statistic RV Percent Paced: 97.99 %
Date Time Interrogation Session: 20150319190024
HIGH POWER IMPEDANCE MEASURED VALUE: 342 Ohm
HIGH POWER IMPEDANCE MEASURED VALUE: 61 Ohm
HighPow Impedance: 190 Ohm
HighPow Impedance: 44 Ohm
Lead Channel Impedance Value: 1216 Ohm
Lead Channel Impedance Value: 437 Ohm
Lead Channel Impedance Value: 456 Ohm
Lead Channel Impedance Value: 760 Ohm
Lead Channel Pacing Threshold Amplitude: 1.125 V
Lead Channel Pacing Threshold Pulse Width: 0.4 ms
Lead Channel Pacing Threshold Pulse Width: 0.4 ms
Lead Channel Pacing Threshold Pulse Width: 1.5 ms
Lead Channel Sensing Intrinsic Amplitude: 1.875 mV
Lead Channel Sensing Intrinsic Amplitude: 14.875 mV
Lead Channel Setting Pacing Amplitude: 1.75 V
Lead Channel Setting Pacing Amplitude: 2.25 V
Lead Channel Setting Pacing Amplitude: 3.5 V
Lead Channel Setting Pacing Pulse Width: 1.5 ms
MDC IDC MSMT LEADCHNL LV IMPEDANCE VALUE: 1805 Ohm
MDC IDC MSMT LEADCHNL LV PACING THRESHOLD AMPLITUDE: 2.75 V
MDC IDC MSMT LEADCHNL RA PACING THRESHOLD AMPLITUDE: 0.875 V
MDC IDC MSMT LEADCHNL RA SENSING INTR AMPL: 1.375 mV
MDC IDC MSMT LEADCHNL RV SENSING INTR AMPL: 20.875 mV
MDC IDC SET LEADCHNL RV PACING PULSEWIDTH: 0.4 ms
MDC IDC SET LEADCHNL RV SENSING SENSITIVITY: 0.3 mV
MDC IDC STAT BRADY AP VP PERCENT: 49.36 %
MDC IDC STAT BRADY AS VP PERCENT: 48.63 %
MDC IDC STAT BRADY RA PERCENT PACED: 49.71 %
Zone Setting Detection Interval: 300 ms
Zone Setting Detection Interval: 350 ms
Zone Setting Detection Interval: 350 ms
Zone Setting Detection Interval: 400 ms

## 2013-10-03 ENCOUNTER — Ambulatory Visit (INDEPENDENT_AMBULATORY_CARE_PROVIDER_SITE_OTHER): Payer: Medicare HMO | Admitting: Cardiology

## 2013-10-03 VITALS — BP 142/90 | HR 90 | Ht 74.0 in | Wt 165.0 lb

## 2013-10-03 DIAGNOSIS — I5022 Chronic systolic (congestive) heart failure: Secondary | ICD-10-CM

## 2013-10-03 DIAGNOSIS — Z79899 Other long term (current) drug therapy: Secondary | ICD-10-CM

## 2013-10-03 DIAGNOSIS — R0602 Shortness of breath: Secondary | ICD-10-CM

## 2013-10-03 DIAGNOSIS — I509 Heart failure, unspecified: Secondary | ICD-10-CM

## 2013-10-03 LAB — CBC
HCT: 35.2 % — ABNORMAL LOW (ref 39.0–52.0)
Hemoglobin: 11.3 g/dL — ABNORMAL LOW (ref 13.0–17.0)
MCH: 24.3 pg — ABNORMAL LOW (ref 26.0–34.0)
MCHC: 32.1 g/dL (ref 30.0–36.0)
MCV: 75.7 fL — ABNORMAL LOW (ref 78.0–100.0)
PLATELETS: 333 10*3/uL (ref 150–400)
RBC: 4.65 MIL/uL (ref 4.22–5.81)
RDW: 18.1 % — AB (ref 11.5–15.5)
WBC: 8.6 10*3/uL (ref 4.0–10.5)

## 2013-10-03 NOTE — Patient Instructions (Addendum)
Your physician recommends that you schedule a follow-up appointment in: 2 weeks  Your physician has recommended you make the following change in your medication: Decrease Metoprolol to 1/2 tablets daily, decrease lasix to 40 mg daily if weight increase more that 2-3 lbs above regular weight take an extra tablet of lasix

## 2013-10-09 ENCOUNTER — Encounter: Payer: Self-pay | Admitting: Cardiology

## 2013-10-09 NOTE — Assessment & Plan Note (Addendum)
Pt notes 1 week of DOE. Physical exam is positive for bilateral LEE. As BP is stable, we will temporarily increase his lasix from 20 mg to 40 mg daily x 3 days. Pt instructed to maintain a low sodium diet and keep a tract of daily weights. Pt was provided sliding scale dosing instructions for lasix.

## 2013-10-09 NOTE — Progress Notes (Signed)
Patient ID: Dakota Liu, male   DOB: [birth year], 80 y.o.   MRN: [id number]    10/09/2013 [place]   [birth year]  [id number]  Primary Physicia Woody Seller, MD Primary Cardiologist: Dr. Sallyanne Kuster  HPI:  80 y/o with a history of CAD, s/p CABG in '88 with subsequent PCI 12/11, 3/12. His last LHC was in October 2014, which revealed patent grafts. The distal RCA PCI site was patent. He has known LVD and is s/p MDT BiV 12/11. His EF by echo 05/02/13 was 40-45% so he appears to be a responder. He is followed by Dr. Sallyanne Kuster.  He presents to clinic today for evaluation of DOE x 1 week. He also notes slight LEE, mild orthopnea. He denies chest pain. He reports daily compliance with his medications.    Current Outpatient Prescriptions  Medication Sig Dispense Refill  . aspirin 81 MG tablet Take 81 mg by mouth daily.      . clopidogrel (PLAVIX) 75 MG tablet Take 75 mg by mouth daily.        . furosemide (LASIX) 20 MG tablet Take 40 mg by mouth daily.      . metoprolol succinate (TOPROL-XL) 25 MG 24 hr tablet Take 12.5 mg by mouth daily.      . nitroGLYCERIN (NITROSTAT) 0.4 MG SL tablet Place 0.4 mg under the tongue every 5 (five) minutes as needed.        Marland Kitchen omeprazole (PRILOSEC) 20 MG capsule Take 20 mg by mouth daily.        . potassium chloride SA (K-DUR,KLOR-CON) 20 MEQ tablet Take 1 tablet (20 mEq total) by mouth 2 (two) times daily.  60 tablet  6  . ranolazine (RANEXA) 500 MG 12 hr tablet Take 1 tablet (500 mg total) by mouth 2 (two) times daily.  60 tablet  0   No current facility-administered medications for this visit.    Allergies  Allergen Reactions  . Tramadol Nausea And Vomiting and Other (See Comments)    Dry heaving, GI upset  . Ace Inhibitors Cough  . Simvastatin     Myalgia- high doses (takes 20mg  at home without issue)    History   Social History  . Marital Status: Married    Spouse Name: N/A    Number of Children: N/A  . Years of Education: N/A    Occupational History  . Not on file.   Social History Main Topics  . Smoking status: Former Smoker -- 1.00 packs/day for 44 years    Types: Cigarettes    Quit date: 07/07/1984  . Smokeless tobacco: Never Used  . Alcohol Use: No  . Drug Use: No  . Sexual Activity: Not on file   Other Topics Concern  . Not on file   Social History Narrative  . No narrative on file     Review of Systems: General: negative for chills, fever, night sweats or weight changes.  Cardiovascular: negative for chest pain, dyspnea on exertion, edema, orthopnea, palpitations, paroxysmal nocturnal dyspnea or shortness of breath Dermatological: negative for rash Respiratory: negative for cough or wheezing Urologic: negative for hematuria Abdominal: negative for nausea, vomiting, diarrhea, bright red blood per rectum, melena, or hematemesis Neurologic: negative for visual changes, syncope, or dizziness All other systems reviewed and are otherwise negative except as noted above.    Blood pressure 142/90, pulse 90, height 6\' 2"  (1.88 m), weight 165 lb (74.844 kg).  General appearance: alert, cooperative and no distress Neck: no JVD Lungs: clear  to auscultation bilaterally Heart: regular rate and rhythm, S1, S2 normal, no murmur, click, rub or gallop Extremities: 1+ bilateral LEE Pulses: 2+ and symmetric Skin: warm and dry Neurologic: Grossly normal  EKG NSR; HR 90 bpm  ASSESSMENT AND PLAN:   Chronic systolic congestive heart failure Pt notes 1 week of DOE. Physical exam is positive for bilateral LEE. As BP is stable, we will temporarily increase his lasix from 20 mg to 40 mg daily x 3 days. Pt instructed to maintain a low sodium diet and keep a tract of daily weights. Pt was provided sliding scale dosing instructions for lasix.     PLAN  Increase Lasix x 3 days. F/u in 1-2 weeks for reassessment.   Marita Burnsed, BRITTAINYPA-C 10/09/2013 10:39 PM

## 2013-10-18 ENCOUNTER — Ambulatory Visit (INDEPENDENT_AMBULATORY_CARE_PROVIDER_SITE_OTHER): Payer: Medicare HMO | Admitting: Cardiology

## 2013-10-18 ENCOUNTER — Encounter: Payer: Self-pay | Admitting: Cardiology

## 2013-10-18 VITALS — BP 130/80 | HR 80 | Ht 74.0 in | Wt 162.6 lb

## 2013-10-18 DIAGNOSIS — I255 Ischemic cardiomyopathy: Secondary | ICD-10-CM

## 2013-10-18 DIAGNOSIS — I2510 Atherosclerotic heart disease of native coronary artery without angina pectoris: Secondary | ICD-10-CM

## 2013-10-18 DIAGNOSIS — I2589 Other forms of chronic ischemic heart disease: Secondary | ICD-10-CM

## 2013-10-18 DIAGNOSIS — I5022 Chronic systolic (congestive) heart failure: Secondary | ICD-10-CM

## 2013-10-18 DIAGNOSIS — I509 Heart failure, unspecified: Secondary | ICD-10-CM

## 2013-10-18 NOTE — Assessment & Plan Note (Signed)
Has an ICD. Denies any arrhthymias or shocks. Last interrogation was 09/2013 and was normal. Repeat interrogation after 3 month period (June).

## 2013-10-18 NOTE — Progress Notes (Signed)
Patient ID: Dakota Liu, male   DOB: Sep 14, 1927, 80 y.o.   MRN: [id number]     10/18/2013 Dakota Liu   04-18-1928  [id number]  Primary Physicia Woody Seller, MD Primary Cardiologist: Dr. Sallyanne Kuster  [name] returns for 2 week f/u for DOE with LEE and fatigue.   HPI:  The patient is a 80 y/o with a history of CAD, s/p CABG in '88 with subsequent PCI 12/11, 3/12. His last LHC was in October 2014, which revealed patent grafts. The distal RCA PCI site was patent. He has known LVD and is s/p MDT BiV 12/11. His EF by echo on 05/02/13 was 40-45% so he appears to be a responder. He is followed by Dr. Sallyanne Kuster.  He presented to clinic 2 weeks ago for evaluation of DOE x 1 week. He also noted slight LEE, mild orthopnea and some fatigue. He denied chest pain. He reported daily compliance with his medications. On physical exam, he was noted to have 1+ bilateral LEE. I instructed him to temporarily increase his lasix to 40 mg BID x 3 days, as well as temporarily increase his potassium for those days.   He returns today for f/u. He is accompanied by his wife. He reports significant improvement. His edema has resolved, as did his dyspnea and orthopnea. He has resumed his regular lasix and potassium doses. He also states that he decided to reduce his Toprol from 25 mg to 12.5 mg daily to see if this would improve his fatigue. He states he has noticed a significant difference and now has more energy. EKG today in the office demonstrates NSR with a ventricular rate of 80 bpm. His BP is also stable at 130/80.    Current Outpatient Prescriptions  Medication Sig Dispense Refill  . aspirin 81 MG tablet Take 81 mg by mouth daily.      . clopidogrel (PLAVIX) 75 MG tablet Take 75 mg by mouth daily.        . furosemide (LASIX) 20 MG tablet Take 40 mg by mouth daily.      . metoprolol succinate (TOPROL-XL) 25 MG 24 hr tablet Take 12.5 mg by mouth daily.      . nitroGLYCERIN (NITROSTAT) 0.4 MG SL tablet  Place 0.4 mg under the tongue every 5 (five) minutes as needed.        Marland Kitchen omeprazole (PRILOSEC) 20 MG capsule Take 20 mg by mouth daily.        . potassium chloride SA (K-DUR,KLOR-CON) 20 MEQ tablet Take 1 tablet (20 mEq total) by mouth 2 (two) times daily.  60 tablet  6  . ranolazine (RANEXA) 500 MG 12 hr tablet Take 1 tablet (500 mg total) by mouth 2 (two) times daily.  60 tablet  0  . simvastatin (ZOCOR) 40 MG tablet Take 40 mg by mouth daily.       No current facility-administered medications for this visit.    Allergies  Allergen Reactions  . Tramadol Nausea And Vomiting and Other (See Comments)    Dry heaving, GI upset  . Ace Inhibitors Cough  . Simvastatin     Myalgia- high doses (takes 20mg  at home without issue)    History   Social History  . Marital Status: Married    Spouse Name: N/A    Number of Children: N/A  . Years of Education: N/A   Occupational History  . Not on file.   Social History Main Topics  . Smoking status: Former Smoker -- 1.00  packs/day for 44 years    Types: Cigarettes    Quit date: 07/07/1984  . Smokeless tobacco: Never Used  . Alcohol Use: No  . Drug Use: No  . Sexual Activity: Not on file   Other Topics Concern  . Not on file   Social History Narrative  . No narrative on file     Review of Systems: General: negative for chills, fever, night sweats or weight changes.  Cardiovascular: negative for chest pain, dyspnea on exertion, edema, orthopnea, palpitations, paroxysmal nocturnal dyspnea or shortness of breath Dermatological: negative for rash Respiratory: negative for cough or wheezing Urologic: negative for hematuria Abdominal: negative for nausea, vomiting, diarrhea, bright red blood per rectum, melena, or hematemesis Neurologic: negative for visual changes, syncope, or dizziness All other systems reviewed and are otherwise negative except as noted above.    Blood pressure 130/80, pulse 80, height 6\' 2"  (1.88 m), weight 162 lb  9.6 oz (73.755 kg).  General appearance: alert, cooperative and no distress Neck: no JVD Lungs: clear to auscultation bilaterally Heart: regular rate and rhythm, S1, S2 normal, no murmur, click, rub or gallop Extremities: 1+ bilateral LEE Pulses: 2+ and symmetric Skin: warm and dry Neurologic: Grossly normal  EKG NSR; HR 80 bpm  ASSESSMENT AND PLAN:   Chronic systolic congestive heart failure Euvolemic on physical exam. LE edema and dyspnea have resolved. Will have patient resume his current dose of Lasix, 20 mg daily. Pt instructed to continue daily weights and was instructed to double lasix to 40 mg if he gains >3 lbs in a 24 hr period. Continue BB.   Ischemic cardiomyopathy- EF improved to 40-45% 10/14 Has an ICD. Denies any arrhthymias or shocks. Last interrogation was 09/2013 and was normal. Repeat interrogation after 3 month period (June).   CAD - CABG '88, RCA Stent 12/11, ISR rx'd with HSRA 3/12; patent grafts on cath 10/14 Stable. Denies angina. Continue ASA, Plavix, Toprol and Ranexa.     PLAN  Chronic CHF now stable. He is euvolemic with no further edema or dyspnea. Continue current medications as outlined above. Ok to continue lower BB dose to reduce fatigue, as both HR and BP have been stable. F/U with Dr. Sallyanne Kuster in June for repeat device check.    Wymon Swaney SimmonsPA-C 10/18/2013 6:33 PM

## 2013-10-18 NOTE — Assessment & Plan Note (Signed)
Stable. Denies angina. Continue ASA, Plavix, Toprol and Ranexa.

## 2013-10-18 NOTE — Assessment & Plan Note (Addendum)
Euvolemic on physical exam. LE edema and dyspnea have resolved. Will have patient resume his current dose of Lasix, 20 mg daily. Pt instructed to continue daily weights and was instructed to double lasix to 40 mg if he gains >3 lbs in a 24 hr period. Continue BB.

## 2013-10-18 NOTE — Patient Instructions (Signed)
Continue taking current meds as prescribed.  F/U with Dr. Sallyanne Kuster in 3 months (middle of June) for ICD check. F/U sooner if needed.

## 2014-02-14 ENCOUNTER — Encounter: Payer: Self-pay | Admitting: Cardiovascular Disease

## 2014-02-14 ENCOUNTER — Ambulatory Visit (INDEPENDENT_AMBULATORY_CARE_PROVIDER_SITE_OTHER): Payer: Medicare HMO | Admitting: Cardiovascular Disease

## 2014-02-14 VITALS — BP 137/80 | HR 85 | Resp 20 | Ht 74.0 in | Wt 169.8 lb

## 2014-02-14 DIAGNOSIS — Z95810 Presence of automatic (implantable) cardiac defibrillator: Secondary | ICD-10-CM

## 2014-02-14 DIAGNOSIS — I472 Ventricular tachycardia: Secondary | ICD-10-CM

## 2014-02-14 DIAGNOSIS — I509 Heart failure, unspecified: Secondary | ICD-10-CM

## 2014-02-14 DIAGNOSIS — I5022 Chronic systolic (congestive) heart failure: Secondary | ICD-10-CM

## 2014-02-14 DIAGNOSIS — I4891 Unspecified atrial fibrillation: Secondary | ICD-10-CM

## 2014-02-14 DIAGNOSIS — I255 Ischemic cardiomyopathy: Secondary | ICD-10-CM

## 2014-02-14 DIAGNOSIS — I447 Left bundle-branch block, unspecified: Secondary | ICD-10-CM

## 2014-02-14 DIAGNOSIS — I4729 Other ventricular tachycardia: Secondary | ICD-10-CM

## 2014-02-14 DIAGNOSIS — I480 Paroxysmal atrial fibrillation: Secondary | ICD-10-CM

## 2014-02-14 DIAGNOSIS — I2589 Other forms of chronic ischemic heart disease: Secondary | ICD-10-CM

## 2014-02-14 DIAGNOSIS — I2510 Atherosclerotic heart disease of native coronary artery without angina pectoris: Secondary | ICD-10-CM

## 2014-02-14 LAB — MDC_IDC_ENUM_SESS_TYPE_INCLINIC
Battery Voltage: 3 V
Brady Statistic AP VP Percent: 10.4 %
Brady Statistic AP VS Percent: 0.1 % — CL
Brady Statistic AS VP Percent: 88.7 %
Brady Statistic AS VS Percent: 0.9 %
Lead Channel Impedance Value: 1178 Ohm
Lead Channel Impedance Value: 437 Ohm
Lead Channel Impedance Value: 456 Ohm
Lead Channel Pacing Threshold Amplitude: 0.75 V
Lead Channel Pacing Threshold Amplitude: 1 V
Lead Channel Pacing Threshold Amplitude: 2.5 V
Lead Channel Pacing Threshold Pulse Width: 0.4 ms
Lead Channel Pacing Threshold Pulse Width: 0.4 ms
Lead Channel Pacing Threshold Pulse Width: 1.5 ms
Lead Channel Sensing Intrinsic Amplitude: 2.1 mV
Lead Channel Sensing Intrinsic Amplitude: 20 mV
Lead Channel Setting Pacing Amplitude: 2 V
Lead Channel Setting Pacing Amplitude: 2.5 V
Lead Channel Setting Pacing Amplitude: 3 V
Lead Channel Setting Pacing Pulse Width: 0.4 ms
Lead Channel Setting Pacing Pulse Width: 1.5 ms
Lead Channel Setting Sensing Sensitivity: 0.3 mV
Zone Setting Detection Interval: 300 ms
Zone Setting Detection Interval: 350 ms
Zone Setting Detection Interval: 350 ms
Zone Setting Detection Interval: 400 ms

## 2014-02-14 NOTE — Progress Notes (Signed)
Patient ID: Dakota Liu, male   DOB: [birth year], 80 y.o.   MRN: [id number]      Reason for office visit CAD, ischemic cardiomyopathy, CHF, defibrillator F/U  80 y/o with a history of CAD, s/p CABG in '88 with subsequent PCI 12/11 (distal RCA stent), 3/12 (Cutting Balloon angioplasty for in-stent restenosis). Last cardiac cath in October 2014 showed severe native vessel disease (70% left main, 95% ostial LAD occluded proximally, all OM branch is occluded, right coronary artery occluded proximally) but with patent grafts (LIMA to the LAD, SVG to diagonal, sequential SVG to OM1 and-1 to-13, SVG to RCA) and patent site of previous stent in the distal RCA. He had moderate to severe ischemic cardiopathy with estimated LVEF of 30% and akinesis of the mid to distal anterior wall and mid to distal inferior wall and apical dyskinesis. He received a CRT-and the Medtronic device in December 2011 with good response and an increase in LVEF to 40-45%. He has treated hypertension and hyperlipidemia and a previous history of lymphoma and iron deficiency anemia. He wears braces on his ankles for unsteady joints. His pacemaker has recorded occasional atrial fibrillation but none has occurred recently and the longest episode in the last 12 months was only roughly 20 minutes in duration. He is not receiving warfarin secondary to unsteady gait and falls. No history of stroke/TIA or other embolic events.  He was seen in March of this year with symptoms of congestive heart failure when he weighed about 165 pounds. After slight diuresis down to 162 pounds his symptom resolved. Today his weight is 169 pounds but he has no complaints of dyspnea and his thoracic impedance is normal. He believes he is gaining real weight because he is "eating too much".  Defibrillator interrogation shows normal function. His left ventricular lead is chronically rather high at 2.5 V at 1.5 ms pulse width. Battery voltage is 3.00 V (ERI equals 2.63  V) last charge time 10.1 seconds. There is 99% biventricular pacing, roughly 10% atrial pacing. No atrial fibrillation has been recorded. A single episode of nonsustained VT of 10 beats is asymptomatic. He has never received ICD therapies for tachycardia.  Allergies  Allergen Reactions  . Tramadol Nausea And Vomiting and Other (See Comments)    Dry heaving, GI upset  . Ace Inhibitors Cough  . Simvastatin     Myalgia- high doses (takes 20mg  at home without issue)    Current Outpatient Prescriptions  Medication Sig Dispense Refill  . aspirin 81 MG tablet Take 81 mg by mouth daily.      . clopidogrel (PLAVIX) 75 MG tablet Take 75 mg by mouth daily.        . furosemide (LASIX) 20 MG tablet Take 40 mg by mouth daily.      . metoprolol succinate (TOPROL-XL) 25 MG 24 hr tablet Take 12.5 mg by mouth daily.      . nitroGLYCERIN (NITROSTAT) 0.4 MG SL tablet Place 0.4 mg under the tongue every 5 (five) minutes as needed.        Marland Kitchen omeprazole (PRILOSEC) 20 MG capsule Take 20 mg by mouth daily.        . potassium chloride SA (K-DUR,KLOR-CON) 20 MEQ tablet Take 1 tablet (20 mEq total) by mouth 2 (two) times daily.  60 tablet  6  . ranolazine (RANEXA) 500 MG 12 hr tablet Take 1 tablet (500 mg total) by mouth 2 (two) times daily.  60 tablet  0  . simvastatin (ZOCOR) 40  MG tablet Take 40 mg by mouth daily.       No current facility-administered medications for this visit.    Past Medical History  Diagnosis Date  . HTN (hypertension)   . Dyslipidemia   . Lymphoma     with metal radiation about 2006  . Epididymitis   . PAF (paroxysmal atrial fibrillation) 12/11  . Anemia, iron deficiency 11/21/2011  . CAD (coronary artery disease) '88, 12/11, 3/12    CABG'88, PCI 12/1, 3/12  . Ischemic cardiomyopathy 10/14    EF improved to 40-45% after BiV ICD  . CHF (congestive heart failure)   . VT (ventricular tachycardia) 12/11  . PVD (peripheral vascular disease) 11/10    AAA R&G  . LBBB (left bundle  branch block)     Past Surgical History  Procedure Laterality Date  . Tonsillectomy    . Orchiectomy    . Coronary artery bypass graft  1988  . Abdominal aortic aneurysm repair  11/10  . Cardiac defibrillator placement  12/11  . Coronary angioplasty with stent placement  07/05/10, 3/12    RCA with ISR 3/12    Family History  Problem Relation Age of Onset  . Diabetes Mother   . Heart Problems Mother   . Stroke Father   . Cancer Sister     History   Social History  . Marital Status: Married    Spouse Name: N/A    Number of Children: N/A  . Years of Education: N/A   Occupational History  . Not on file.   Social History Main Topics  . Smoking status: Former Smoker -- 1.00 packs/day for 44 years    Types: Cigarettes    Quit date: 07/07/1984  . Smokeless tobacco: Never Used  . Alcohol Use: No  . Drug Use: No  . Sexual Activity: Not on file   Other Topics Concern  . Not on file   Social History Narrative  . No narrative on file    Review of systems: The patient specifically denies any chest pain at rest or with exertion, dyspnea at rest or with exertion, orthopnea, paroxysmal nocturnal dyspnea, syncope, palpitations, focal neurological deficits, intermittent claudication, lower extremity edema, unexplained weight gain, cough, hemoptysis or wheezing.  The patient also denies abdominal pain, nausea, vomiting, dysphagia, diarrhea, constipation, polyuria, polydipsia, dysuria, hematuria, frequency, urgency, abnormal bleeding or bruising, fever, chills, unexpected weight changes, mood swings, change in skin or hair texture, change in voice quality, auditory or visual problems, allergic reactions or rashes, new musculoskeletal complaints other than usual "aches and pains".   PHYSICAL EXAM BP 137/80  Pulse 85  Resp 16  Ht 6\' 2"  (1.88 m)  Wt 169 lb 12.8 oz (77.021 kg)  BMI 21.79 kg/m2 General: Alert, oriented x3, no distress  Head: no evidence of trauma, PERRL, EOMI, no  exophtalmos or lid lag, no myxedema, no xanthelasma; normal ears, nose and oropharynx  Neck: normal jugular venous pulsations and no hepatojugular reflux; brisk carotid pulses without delay and no carotid bruits  Chest: clear to auscultation, no signs of consolidation by percussion or palpation, normal fremitus, symmetrical and full respiratory excursions; the defibrillator site looks healthy Cardiovascular: normal position and quality of the apical impulse, regular rhythm, normal first and paradoxically split second heart sounds, rate 1/6 flow systolic murmur at the left lower sternal border, no rubs or gallops  Abdomen: Midline laparotomy scar; no tenderness or distention, no masses by palpation, no abnormal pulsatility or arterial bruits, normal bowel sounds, no  hepatosplenomegaly  Extremities: no clubbing, cyanosis or edema; 2+ radial, ulnar and brachial pulses bilaterally; 2+ right femoral, posterior tibial and dorsalis pedis pulses; 2+ left femoral, posterior tibial and dorsalis pedis pulses; no subclavian or femoral bruits  Neurological: grossly nonfocal   EKG: Atrial sense, ventricular paced  Lipid Panel     Component Value Date/Time   CHOL  Value: 164        ATP III CLASSIFICATION:  <200     mg/dL   Desirable  200-239  mg/dL   Borderline High  >=240    mg/dL   High        07/02/2010 0825   TRIG 115 07/02/2010 0825   HDL 28* 07/02/2010 0825   CHOLHDL 5.9 07/02/2010 0825   VLDL 23 07/02/2010 0825   LDLCALC  Value: 113        Total Cholesterol/HDL:CHD Risk Coronary Heart Disease Risk Table                     Men   Women  1/2 Average Risk   3.4   3.3  Average Risk       5.0   4.4  2 X Average Risk   9.6   7.1  3 X Average Risk  23.4   11.0        Use the calculated Patient Ratio above and the CHD Risk Table to determine the patient's CHD Risk.        ATP III CLASSIFICATION (LDL):  <100     mg/dL   Optimal  100-129  mg/dL   Near or Above                    Optimal  130-159  mg/dL   Borderline   160-189  mg/dL   High  >190     mg/dL   Very High* 07/02/2010 0825    BMET    Component Value Date/Time   NA 135 05/04/2013 0911   NA 141 06/07/2012 0923   K 2.9* 05/04/2013 0911   K 3.5 06/07/2012 0923   CL 98 05/04/2013 0911   CL 105 06/07/2012 0923   CO2 27 05/04/2013 0911   CO2 26 06/07/2012 0923   GLUCOSE 135* 05/04/2013 0911   GLUCOSE 134* 06/07/2012 0923   BUN 17 05/04/2013 0911   BUN 14.0 06/07/2012 0923   CREATININE 0.89 05/04/2013 0911   CREATININE 0.9 06/07/2012 0923   CALCIUM 8.3* 05/04/2013 0911   CALCIUM 8.9 06/07/2012 0923   GFRNONAA 77* 05/04/2013 0911   GFRAA 89* 05/04/2013 0911     ASSESSMENT AND PLAN CAD - CABG '88, RCA Stent 12/11, ISR  3/12; patent grafts on cath 10/14  Currently asymptomatic. Low risk myocardium perfusion study in April 2013 with an extensive scar involving the entire LAD distribution.   Ischemic cardiomyopathy- EF improved to 40-45% 10/14  NYHA class II, clinically euvolemic on beta blocker and ARB therapy. Stable thoracic impedance recently although multiple fluctuations over the past year. Only needs a low dose of loop diuretic.   S/P Biv ICD 12/11  CRT-D device check in office. All the parameters normal. No mode switch episodes recorded. Continue remote monitoring. In office evaluation in 6 months.  PAF - 12/11- (no Coumadin secondary to unsteady gait)  No recent events by pacemaker check.  Hyperlipidemia Physical with primary care physician in about a month's time when he will have his lipid profile reviewed.  Patient Instructions  Remote monitoring is  used to monitor your ICD from home. This monitoring reduces the number of office visits required to check your device to one time per year. It allows [place] to keep an eye on the functioning of your device to ensure it is working properly. You are scheduled for a device check from home on 05-18-2014. You may send your transmission at any time that day. If you have a wireless device, the  transmission will be sent automatically. After your physician reviews your transmission, you will receive a postcard with your next transmission date.  Your physician recommends that you schedule a follow-up appointment in: 6 months with Dr.Aayliah Rotenberry       Orders Placed This Encounter  Procedures  . EKG 12-Lead   No orders of the defined types were placed in this encounter.    Holli Humbles, MD, [place] [phone] office [phone] pager

## 2014-02-14 NOTE — Patient Instructions (Signed)
Remote monitoring is used to monitor your ICD from home. This monitoring reduces the number of office visits required to check your device to one time per year. It allows [place] to keep an eye on the functioning of your device to ensure it is working properly. You are scheduled for a device check from home on 05-18-2014. You may send your transmission at any time that day. If you have a wireless device, the transmission will be sent automatically. After your physician reviews your transmission, you will receive a postcard with your next transmission date.  Your physician recommends that you schedule a follow-up appointment in: 6 months with Dr.Croitoru

## 2014-02-15 ENCOUNTER — Encounter: Payer: Commercial Managed Care - HMO | Admitting: Cardiovascular Disease

## 2014-03-20 ENCOUNTER — Encounter: Payer: Self-pay | Admitting: Internal Medicine

## 2014-04-21 NOTE — Telephone Encounter (Signed)
Nothing was typed/tmj 

## 2014-05-18 ENCOUNTER — Encounter: Payer: Commercial Managed Care - HMO | Admitting: *Deleted

## 2014-05-19 ENCOUNTER — Telehealth: Payer: Self-pay | Admitting: Cardiology

## 2014-05-19 NOTE — Telephone Encounter (Signed)
LMOVM reminding pt to send remote transmission.   

## 2014-06-15 ENCOUNTER — Encounter (HOSPITAL_COMMUNITY): Payer: Self-pay | Admitting: Cardiology

## 2014-08-22 ENCOUNTER — Encounter: Payer: Self-pay | Admitting: Cardiovascular Disease

## 2014-08-22 ENCOUNTER — Ambulatory Visit (INDEPENDENT_AMBULATORY_CARE_PROVIDER_SITE_OTHER): Payer: Medicare Other | Admitting: Cardiovascular Disease

## 2014-08-22 VITALS — BP 151/81 | HR 89 | Ht 74.0 in | Wt 177.0 lb

## 2014-08-22 DIAGNOSIS — I255 Ischemic cardiomyopathy: Secondary | ICD-10-CM

## 2014-08-22 DIAGNOSIS — I447 Left bundle-branch block, unspecified: Secondary | ICD-10-CM

## 2014-08-22 DIAGNOSIS — I2510 Atherosclerotic heart disease of native coronary artery without angina pectoris: Secondary | ICD-10-CM

## 2014-08-22 DIAGNOSIS — I5022 Chronic systolic (congestive) heart failure: Secondary | ICD-10-CM

## 2014-08-22 DIAGNOSIS — I472 Ventricular tachycardia, unspecified: Secondary | ICD-10-CM

## 2014-08-22 DIAGNOSIS — Z95810 Presence of automatic (implantable) cardiac defibrillator: Secondary | ICD-10-CM

## 2014-08-22 DIAGNOSIS — I480 Paroxysmal atrial fibrillation: Secondary | ICD-10-CM

## 2014-08-22 NOTE — Progress Notes (Signed)
Patient ID: Dakota Liu, male   DOB: Oct 03, 1927, 81 y.o.   MRN: [id number]     Reason for office visit CAD, ischemic cardiomyopathy, CHF, defibrillator F/U  Seems to be doing quite well despite a significant increase in weight - seems to be real, not fluid. BMI still quite normal at 22-23. Has persistent leg edema, R always more than L. NYHA class II exertional dyspnea.  Biggest complaint is orthostatic dizziness and he stopped metoprolol because he felt poorly and BP was low.  No angina on Ranexa. No bleeding on ASA and clopidogrel.  BiV ICD with normal function (Medtronic Protects CRT-D 2011) , almost 98% BiV pacing, 5% atrial pacing. Two very brief episodes of asymptomatic nonsustained VT, frequent PVCs, no atrial fibrillation. Optivol does not show volume overload.  His device has not successfully downloaded to [place] since August. Trying to get him a new remote transmitter.  81 y/o with a history of CAD, s/p CABG in '88 with subsequent PCI 12/11 (distal RCA stent), 3/12 (Cutting Balloon angioplasty for in-stent restenosis). Last cardiac cath in October 2014 showed severe native vessel disease (70% left main, 95% ostial LAD occluded proximally, all OM branch is occluded, right coronary artery occluded proximally) but with patent grafts (LIMA to the LAD, SVG to diagonal, sequential SVG to OM1 and-1 to-13, SVG to RCA) and patent site of previous stent in the distal RCA. He had moderate to severe ischemic cardiopathy with estimated LVEF of 30% and akinesis of the mid to distal anterior wall and mid to distal inferior wall and apical dyskinesis. He received a CRT-and the Medtronic device in December 2011 with good response and an increase in LVEF to 40-45%. He has treated hypertension and hyperlipidemia and a previous history of lymphoma and iron deficiency anemia. He wears braces on his ankles for unsteady joints. His pacemaker has recorded occasional atrial fibrillation but none has occurred  recently and the longest episode in the last 18 months was only roughly 20 minutes in duration. He is not receiving warfarin secondary to unsteady gait and falls. No history of stroke/TIA or other embolic events.   Allergies  Allergen Reactions  . Tramadol Nausea And Vomiting and Other (See Comments)    Dry heaving, GI upset  . Ace Inhibitors Cough  . Simvastatin     Myalgia- high doses (takes 20mg  at home without issue)    Current Outpatient Prescriptions  Medication Sig Dispense Refill  . aspirin 81 MG tablet Take 81 mg by mouth daily.    . clopidogrel (PLAVIX) 75 MG tablet Take 75 mg by mouth daily.      . furosemide (LASIX) 20 MG tablet Take 40 mg by mouth daily.    . iron polysaccharides (NIFEREX) 150 MG capsule Take 150 mg by mouth daily.    . nitroGLYCERIN (NITROSTAT) 0.4 MG SL tablet Place 0.4 mg under the tongue every 5 (five) minutes as needed.      Marland Kitchen omeprazole (PRILOSEC) 20 MG capsule Take 20 mg by mouth daily.      . potassium chloride SA (K-DUR,KLOR-CON) 20 MEQ tablet Take 1 tablet (20 mEq total) by mouth 2 (two) times daily. 60 tablet 6  . ranolazine (RANEXA) 500 MG 12 hr tablet Take 1 tablet (500 mg total) by mouth 2 (two) times daily. 60 tablet 0  . simvastatin (ZOCOR) 40 MG tablet Take 40 mg by mouth daily.     No current facility-administered medications for this visit.    Past Medical History  Diagnosis Date  . HTN (hypertension)   . Dyslipidemia   . Lymphoma     with metal radiation about 2006  . Epididymitis   . PAF (paroxysmal atrial fibrillation) 12/11  . Anemia, iron deficiency 11/21/2011  . CAD (coronary artery disease) '88, 12/11, 3/12    CABG'88, PCI 12/1, 3/12  . Ischemic cardiomyopathy 10/14    EF improved to 40-45% after BiV ICD  . CHF (congestive heart failure)   . VT (ventricular tachycardia) 12/11  . PVD (peripheral vascular disease) 11/10    AAA R&G  . LBBB (left bundle branch block)     Past Surgical History  Procedure Laterality Date   . Tonsillectomy    . Orchiectomy    . Coronary artery bypass graft  1988  . Abdominal aortic aneurysm repair  11/10  . Cardiac defibrillator placement  12/11  . Coronary angioplasty with stent placement  07/05/10, 3/12    RCA with ISR 3/12  . Left heart catheterization with coronary/graft angiogram N/A 05/03/2013    Procedure: LEFT HEART CATHETERIZATION WITH Beatrix Fetters;  Surgeon: Peter M Martinique, MD;  Location: [facility] CATH LAB;  Service: Cardiovascular;  Laterality: N/A;    Family History  Problem Relation Age of Onset  . Diabetes Mother   . Heart Problems Mother   . Stroke Father   . Cancer Sister     History   Social History  . Marital Status: Married    Spouse Name: N/A  . Number of Children: N/A  . Years of Education: N/A   Occupational History  . Not on file.   Social History Main Topics  . Smoking status: Former Smoker -- 1.00 packs/day for 44 years    Types: Cigarettes    Quit date: 07/07/1984  . Smokeless tobacco: Never Used  . Alcohol Use: No  . Drug Use: No  . Sexual Activity: Not on file   Other Topics Concern  . Not on file   Social History Narrative    Review of systems: The patient specifically denies any chest pain at rest or with exertion, dyspnea at rest or with light exertion, orthopnea, paroxysmal nocturnal dyspnea, full blown syncope, palpitations, focal neurological deficits, intermittent claudication, lower extremity edema, unexplained weight gain, cough, hemoptysis or wheezing.  The patient also denies abdominal pain, nausea, vomiting, dysphagia, diarrhea, constipation, polyuria, polydipsia, dysuria, hematuria, frequency, urgency, abnormal bleeding or bruising, fever, chills, unexpected weight changes, mood swings, change in skin or hair texture, change in voice quality, auditory or visual problems, allergic reactions or rashes, new musculoskeletal complaints other than usual "aches and pains".    PHYSICAL EXAM BP 151/81 mmHg   Pulse 89  Ht 6\' 2"  (1.88 m)  Wt 80.287 kg (177 lb)  BMI 22.72 kg/m2 General: Alert, oriented x3, no distress  Head: no evidence of trauma, PERRL, EOMI, no exophtalmos or lid lag, no myxedema, no xanthelasma; normal ears, nose and oropharynx  Neck: normal jugular venous pulsations and no hepatojugular reflux; brisk carotid pulses without delay and no carotid bruits  Chest: clear to auscultation, no signs of consolidation by percussion or palpation, normal fremitus, symmetrical and full respiratory excursions; the defibrillator site looks healthy Cardiovascular: normal position and quality of the apical impulse, regular rhythm, normal first and paradoxically split second heart sounds, rate 1/6 flow systolic murmur at the left lower sternal border, no rubs or gallops  Abdomen: Midline laparotomy scar; no tenderness or distention, no masses by palpation, no abnormal pulsatility or arterial bruits, normal bowel  sounds, no hepatosplenomegaly  Extremities: no clubbing, cyanosis; 1+ left ankle edema, 2+ right ankle edema edema; 2+ radial, ulnar and brachial pulses bilaterally; 2+ right femoral, posterior tibial and dorsalis pedis pulses; 2+ left femoral, posterior tibial and dorsalis pedis pulses; no subclavian or femoral bruits  Neurological: grossly nonfocal  EKG: A sensed BiV paced, occasional PVCs/fusion beats  Lipid Panel     Component Value Date/Time   CHOL  07/02/2010 0825    164        ATP III CLASSIFICATION:  <200     mg/dL   Desirable  200-239  mg/dL   Borderline High  >=240    mg/dL   High          TRIG 115 07/02/2010 0825   HDL 28* 07/02/2010 0825   CHOLHDL 5.9 07/02/2010 0825   VLDL 23 07/02/2010 0825   LDLCALC * 07/02/2010 0825    113        Total Cholesterol/HDL:CHD Risk Coronary Heart Disease Risk Table                     Men   Women  1/2 Average Risk   3.4   3.3  Average Risk       5.0   4.4  2 X Average Risk   9.6   7.1  3 X Average Risk  23.4   11.0        Use  the calculated Patient Ratio above and the CHD Risk Table to determine the patient's CHD Risk.        ATP III CLASSIFICATION (LDL):  <100     mg/dL   Optimal  100-129  mg/dL   Near or Above                    Optimal  130-159  mg/dL   Borderline  160-189  mg/dL   High  >190     mg/dL   Very High    BMET    Component Value Date/Time   NA 135 05/04/2013 0911   NA 141 06/07/2012 0923   K 2.9* 05/04/2013 0911   K 3.5 06/07/2012 0923   CL 98 05/04/2013 0911   CL 105 06/07/2012 0923   CO2 27 05/04/2013 0911   CO2 26 06/07/2012 0923   GLUCOSE 135* 05/04/2013 0911   GLUCOSE 134* 06/07/2012 0923   BUN 17 05/04/2013 0911   BUN 14.0 06/07/2012 0923   CREATININE 0.89 05/04/2013 0911   CREATININE 0.9 06/07/2012 0923   CALCIUM 8.3* 05/04/2013 0911   CALCIUM 8.9 06/07/2012 0923   GFRNONAA 77* 05/04/2013 0911   GFRAA 89* 05/04/2013 0911     ASSESSMENT AND PLAN CAD - CABG '88, RCA Stent 12/11, ISR 3/12; patent grafts on cath 10/14  Currently asymptomatic. Low risk myocardial perfusion study in April 2013 with an extensive scar involving the entire LAD distribution.   Ischemic cardiomyopathy- EF improved to 40-45% 10/14  Difficult management due to orthostatic dizziness, which he finds to be most troublesome symptom. NYHA class II. He has gradually stopped beta blocker and ARB therapy due to symptomatic hypotension. Stable thoracic impedance recently, but has ankle swelling. Only needs a low dose of loop diuretic.   S/P Biv ICD 12/11  CRT-D device check in office. All the parameters normal. No mode switch episodes recorded. Continue remote monitoring. In office evaluation in 6 months.  PAF - 12/11- (no Coumadin secondary to unsteady gait)  No recent events  by pacemaker check.  Hyperlipidemia Need updated renal function, LFTs and lipid panel   Orders Placed This Encounter  Procedures  . EKG 12-Lead   Meds ordered this encounter  Medications  . iron polysaccharides  (NIFEREX) 150 MG capsule    Sig: Take 150 mg by mouth daily.    Holli Humbles, MD, [place] [phone] office [phone] pager

## 2014-08-22 NOTE — Patient Instructions (Signed)
Remote monitoring is used to monitor your Pacemaker or ICD from home. This monitoring reduces the number of office visits required to check your device to one time per year. It allows [place] to monitor the functioning of your device to ensure it is working properly. You are scheduled for a device check from home on Nov 21, 2014. You may send your transmission at any time that day. If you have a wireless device, the transmission will be sent automatically. After your physician reviews your transmission, you will receive a postcard with your next transmission date.  Dr. Sallyanne Kuster recommends that you schedule a follow-up appointment in: One year.

## 2014-08-25 ENCOUNTER — Telehealth: Payer: Self-pay | Admitting: *Deleted

## 2014-08-25 DIAGNOSIS — R0602 Shortness of breath: Secondary | ICD-10-CM

## 2014-08-25 DIAGNOSIS — Z79899 Other long term (current) drug therapy: Secondary | ICD-10-CM

## 2014-08-25 DIAGNOSIS — E785 Hyperlipidemia, unspecified: Secondary | ICD-10-CM

## 2014-08-25 NOTE — Telephone Encounter (Signed)
-----   Message from Sanda Klein, MD sent at 08/22/2014  5:55 PM EST ----- Need lipids and CMET and BNP please, at his convenience.

## 2014-08-25 NOTE — Telephone Encounter (Signed)
Patient and wife notified to get fasting labs done.  Wants to get done at [facility] in [place].  Lab order mailed to patient.  Voiced understanding.

## 2014-09-04 LAB — MDC_IDC_ENUM_SESS_TYPE_INCLINIC
Battery Voltage: 2.91 V
Brady Statistic AP VP Percent: 5.35 %
Brady Statistic RA Percent Paced: 5.39 %
HIGH POWER IMPEDANCE MEASURED VALUE: 342 Ohm
HIGH POWER IMPEDANCE MEASURED VALUE: 44 Ohm
HIGH POWER IMPEDANCE MEASURED VALUE: 59 Ohm
Lead Channel Impedance Value: 1083 Ohm
Lead Channel Impedance Value: 399 Ohm
Lead Channel Pacing Threshold Amplitude: 0.875 V
Lead Channel Pacing Threshold Amplitude: 1 V
Lead Channel Pacing Threshold Amplitude: 2.5 V
Lead Channel Pacing Threshold Pulse Width: 0.4 ms
Lead Channel Pacing Threshold Pulse Width: 1.5 ms
Lead Channel Sensing Intrinsic Amplitude: 15.875 mV
Lead Channel Sensing Intrinsic Amplitude: 15.875 mV
Lead Channel Sensing Intrinsic Amplitude: 2 mV
Lead Channel Sensing Intrinsic Amplitude: 2 mV
Lead Channel Setting Pacing Amplitude: 2 V
Lead Channel Setting Pacing Amplitude: 3 V
Lead Channel Setting Pacing Pulse Width: 0.4 ms
MDC IDC MSMT LEADCHNL LV IMPEDANCE VALUE: 1672 Ohm
MDC IDC MSMT LEADCHNL LV IMPEDANCE VALUE: 817 Ohm
MDC IDC MSMT LEADCHNL RA IMPEDANCE VALUE: 437 Ohm
MDC IDC MSMT LEADCHNL RA PACING THRESHOLD PULSEWIDTH: 0.4 ms
MDC IDC SESS DTM: 20160216171025
MDC IDC SET LEADCHNL LV PACING PULSEWIDTH: 1.5 ms
MDC IDC SET LEADCHNL RV PACING AMPLITUDE: 2.5 V
MDC IDC SET LEADCHNL RV SENSING SENSITIVITY: 0.3 mV
MDC IDC SET ZONE DETECTION INTERVAL: 400 ms
MDC IDC STAT BRADY AP VS PERCENT: 0.04 %
MDC IDC STAT BRADY AS VP PERCENT: 92.36 %
MDC IDC STAT BRADY AS VS PERCENT: 2.25 %
MDC IDC STAT BRADY RV PERCENT PACED: 97.71 %
Zone Setting Detection Interval: 300 ms
Zone Setting Detection Interval: 350 ms
Zone Setting Detection Interval: 350 ms

## 2014-09-11 ENCOUNTER — Encounter: Payer: Self-pay | Admitting: Internal Medicine

## 2014-11-21 ENCOUNTER — Ambulatory Visit (INDEPENDENT_AMBULATORY_CARE_PROVIDER_SITE_OTHER): Payer: Medicare Other | Admitting: *Deleted

## 2014-11-21 ENCOUNTER — Encounter: Payer: Self-pay | Admitting: Cardiovascular Disease

## 2014-11-21 ENCOUNTER — Telehealth: Payer: Self-pay | Admitting: Cardiology

## 2014-11-21 DIAGNOSIS — I255 Ischemic cardiomyopathy: Secondary | ICD-10-CM | POA: Diagnosis not present

## 2014-11-21 DIAGNOSIS — I5022 Chronic systolic (congestive) heart failure: Secondary | ICD-10-CM | POA: Diagnosis not present

## 2014-11-21 NOTE — Progress Notes (Signed)
Remote ICD transmission.   

## 2014-11-21 NOTE — Telephone Encounter (Signed)
Spoke with pt and reminded pt of remote transmission that is due today. Pt verbalized understanding.   

## 2014-11-24 LAB — CUP PACEART REMOTE DEVICE CHECK
Battery Voltage: 2.83 V
Brady Statistic AP VP Percent: 4.61 %
Brady Statistic AP VS Percent: 0.04 %
Brady Statistic AS VP Percent: 93.49 %
Brady Statistic RA Percent Paced: 4.65 %
Date Time Interrogation Session: 20160517161108
HIGH POWER IMPEDANCE MEASURED VALUE: 342 Ohm
HighPow Impedance: 43 Ohm
HighPow Impedance: 57 Ohm
Lead Channel Impedance Value: 437 Ohm
Lead Channel Impedance Value: 779 Ohm
Lead Channel Pacing Threshold Amplitude: 0.875 V
Lead Channel Pacing Threshold Amplitude: 5.5 V
Lead Channel Pacing Threshold Pulse Width: 0.4 ms
Lead Channel Pacing Threshold Pulse Width: 0.4 ms
Lead Channel Pacing Threshold Pulse Width: 1.5 ms
Lead Channel Sensing Intrinsic Amplitude: 15.5 mV
Lead Channel Sensing Intrinsic Amplitude: 15.5 mV
Lead Channel Sensing Intrinsic Amplitude: 2.25 mV
Lead Channel Setting Pacing Amplitude: 2.5 V
Lead Channel Setting Pacing Amplitude: 3 V
Lead Channel Setting Pacing Pulse Width: 1.5 ms
MDC IDC MSMT LEADCHNL LV IMPEDANCE VALUE: 1558 Ohm
MDC IDC MSMT LEADCHNL LV IMPEDANCE VALUE: 988 Ohm
MDC IDC MSMT LEADCHNL RA PACING THRESHOLD AMPLITUDE: 0.875 V
MDC IDC MSMT LEADCHNL RA SENSING INTR AMPL: 2.25 mV
MDC IDC MSMT LEADCHNL RV IMPEDANCE VALUE: 437 Ohm
MDC IDC SET LEADCHNL RA PACING AMPLITUDE: 2 V
MDC IDC SET LEADCHNL RV PACING PULSEWIDTH: 0.4 ms
MDC IDC SET LEADCHNL RV SENSING SENSITIVITY: 0.3 mV
MDC IDC SET ZONE DETECTION INTERVAL: 300 ms
MDC IDC SET ZONE DETECTION INTERVAL: 350 ms
MDC IDC SET ZONE DETECTION INTERVAL: 350 ms
MDC IDC SET ZONE DETECTION INTERVAL: 400 ms
MDC IDC STAT BRADY AS VS PERCENT: 1.86 %
MDC IDC STAT BRADY RV PERCENT PACED: 98.1 %

## 2014-11-30 ENCOUNTER — Encounter: Payer: Self-pay | Admitting: Cardiology

## 2015-01-22 ENCOUNTER — Other Ambulatory Visit: Payer: Self-pay | Admitting: Cardiology

## 2015-01-22 NOTE — Telephone Encounter (Signed)
Rx(s) sent to pharmacy electronically.  

## 2015-02-21 ENCOUNTER — Ambulatory Visit (INDEPENDENT_AMBULATORY_CARE_PROVIDER_SITE_OTHER): Payer: Medicare Other | Admitting: *Deleted

## 2015-02-21 DIAGNOSIS — I255 Ischemic cardiomyopathy: Secondary | ICD-10-CM

## 2015-02-21 DIAGNOSIS — I5022 Chronic systolic (congestive) heart failure: Secondary | ICD-10-CM

## 2015-02-21 NOTE — Progress Notes (Signed)
Remote ICD transmission.   

## 2015-02-28 LAB — CUP PACEART REMOTE DEVICE CHECK
Battery Voltage: 2.71 V
Brady Statistic AP VP Percent: 1.94 %
Brady Statistic AS VP Percent: 96.88 %
Brady Statistic RA Percent Paced: 1.98 %
Brady Statistic RV Percent Paced: 98.81 %
Date Time Interrogation Session: 20160817052408
HighPow Impedance: 342 Ohm
HighPow Impedance: 42 Ohm
HighPow Impedance: 55 Ohm
Lead Channel Impedance Value: 1501 Ohm
Lead Channel Impedance Value: 399 Ohm
Lead Channel Impedance Value: 988 Ohm
Lead Channel Pacing Threshold Amplitude: 0.75 V
Lead Channel Pacing Threshold Amplitude: 0.75 V
Lead Channel Pacing Threshold Amplitude: 2.75 V
Lead Channel Pacing Threshold Pulse Width: 0.4 ms
Lead Channel Pacing Threshold Pulse Width: 0.4 ms
Lead Channel Sensing Intrinsic Amplitude: 15.75 mV
Lead Channel Sensing Intrinsic Amplitude: 2 mV
Lead Channel Sensing Intrinsic Amplitude: 2 mV
Lead Channel Setting Pacing Amplitude: 2 V
Lead Channel Setting Pacing Amplitude: 3 V
Lead Channel Setting Pacing Pulse Width: 0.4 ms
Lead Channel Setting Pacing Pulse Width: 1.5 ms
Lead Channel Setting Sensing Sensitivity: 0.3 mV
MDC IDC MSMT LEADCHNL LV IMPEDANCE VALUE: 760 Ohm
MDC IDC MSMT LEADCHNL LV PACING THRESHOLD PULSEWIDTH: 1.5 ms
MDC IDC MSMT LEADCHNL RV IMPEDANCE VALUE: 399 Ohm
MDC IDC MSMT LEADCHNL RV SENSING INTR AMPL: 15.75 mV
MDC IDC SET LEADCHNL RV PACING AMPLITUDE: 2.5 V
MDC IDC STAT BRADY AP VS PERCENT: 0.04 %
MDC IDC STAT BRADY AS VS PERCENT: 1.14 %
Zone Setting Detection Interval: 300 ms
Zone Setting Detection Interval: 350 ms
Zone Setting Detection Interval: 350 ms
Zone Setting Detection Interval: 400 ms

## 2015-03-19 ENCOUNTER — Encounter: Payer: Self-pay | Admitting: Cardiology

## 2015-03-29 ENCOUNTER — Telehealth: Payer: Self-pay

## 2015-03-29 NOTE — Telephone Encounter (Signed)
Attempted ICM enrollment call to patient and line busy x 3.  ICM transmission received.

## 2015-03-30 ENCOUNTER — Encounter: Payer: Self-pay | Admitting: Cardiovascular Disease

## 2015-04-03 NOTE — Telephone Encounter (Signed)
Unable to reach member for ICM enrollment introduction and follow up.  Optivol transmission rescheduled for 04/18/2015 for ICM intro and follow up.  Enrollment letter mailed to patient.

## 2015-04-18 ENCOUNTER — Ambulatory Visit (INDEPENDENT_AMBULATORY_CARE_PROVIDER_SITE_OTHER): Payer: Medicare Other

## 2015-04-18 DIAGNOSIS — I5022 Chronic systolic (congestive) heart failure: Secondary | ICD-10-CM

## 2015-04-18 DIAGNOSIS — Z95810 Presence of automatic (implantable) cardiac defibrillator: Secondary | ICD-10-CM

## 2015-04-18 NOTE — Progress Notes (Signed)
EPIC Encounter for ICM Monitoring  Patient Name: Dakota Liu is a 81 y.o. male Date: 04/18/2015 Primary Care Physican: Woody Seller, MD Primary Cardiologist: Croitoru Electrophysiologist: Croitoru Dry Weight: 178 lbs       In the past month, have you:  1. Gained more than 2 pounds in a day or more than 5 pounds in a week? No, does not weigh daily due to difficult to stand on scales  2. Had changes in your medications (with verification of current medications)? no  3. Had more shortness of breath than is usual for you? no  4. Limited your activity because of shortness of breath? no  5. Not been able to sleep because of shortness of breath? no  6. Had increased swelling in your feet or ankles? No, he always has some leg swelling, right worse than left, following his open heart surgery.   7. Had symptoms of dehydration (dizziness, dry mouth, increased thirst, decreased urine output) no  8. Had changes in sodium restriction? no  9. Been compliant with medication? Yes   ICM trend: 04/18/2015   Follow-up plan: ICM clinic phone appointment 05/28/2015. 1st encounter for ICM enrollment/introduction and he agreed to monthly ICM follow up.   Optivol impedance trending slightly below baseline at time of transmission, 04/18/2015.  Patient reported he can tell if he has more fluid because he has more difficulty breathing at night when lying down.  He reported he normally takes Lasix 20mg  daily but he has been given instructions from physician he can increase up to 40 mg daily if needed.  He reported last few days he is taking 30mg  daily and breathing has improved. He reported the Ranexa makes him feel very tired.  He does not able to walk very much and mostly able to stand.     Copy of note sent to patient's primary care physician, primary cardiologist, and device following physician.  Rosalene Billings, RN, CCM 04/18/2015 9:40 AM

## 2015-04-23 ENCOUNTER — Other Ambulatory Visit: Payer: Self-pay | Admitting: Cardiovascular Disease

## 2015-04-23 NOTE — Telephone Encounter (Signed)
Rx request sent to pharmacy.  

## 2015-05-24 ENCOUNTER — Other Ambulatory Visit: Payer: Self-pay | Admitting: Cardiovascular Disease

## 2015-05-24 NOTE — Telephone Encounter (Signed)
REFILL 

## 2015-05-28 ENCOUNTER — Ambulatory Visit (INDEPENDENT_AMBULATORY_CARE_PROVIDER_SITE_OTHER): Payer: Medicare Other | Admitting: *Deleted

## 2015-05-28 DIAGNOSIS — I255 Ischemic cardiomyopathy: Secondary | ICD-10-CM

## 2015-05-28 DIAGNOSIS — I5022 Chronic systolic (congestive) heart failure: Secondary | ICD-10-CM | POA: Diagnosis not present

## 2015-05-28 DIAGNOSIS — Z95810 Presence of automatic (implantable) cardiac defibrillator: Secondary | ICD-10-CM | POA: Diagnosis not present

## 2015-05-28 NOTE — Progress Notes (Signed)
Remote ICD transmission.   

## 2015-05-28 NOTE — Progress Notes (Addendum)
EPIC Encounter for ICM Monitoring  Patient Name: Dakota Liu is a 81 y.o. male Date: 05/28/2015 Primary Care Physican: Woody Seller, MD Primary Cardiologist: Croitoru Electrophysiologist: Croitoru Dry Weight: 178 lbs  Bi-V Pacing 97.2%       In the past month, have you:  1. Gained more than 2 pounds in a day or more than 5 pounds in a week? no  2. Had changes in your medications (with verification of current medications)? no  3. Had more shortness of breath than is usual for you? no  4. Limited your activity because of shortness of breath? no  5. Not been able to sleep because of shortness of breath? no  6. Had increased swelling in your feet or ankles? no  7. Had symptoms of dehydration (dizziness, dry mouth, increased thirst, decreased urine output) no  8. Had changes in sodium restriction? no  9. Been compliant with medication? Yes   ICM trend:  05/28/2015   Follow-up plan: ICM clinic phone appointment 06/28/2015.  Optivol daily impedance below baseline ~05/08/2015 to 05/28/2015 suggesting fluid retention.  He stated he does have shortness of breath but that is a chronic symptom and some fatigue.  He normally takes Furosemide 20mg  bid but he reported per the physician, he can take extra tablet when needed.    Recommended he take Furosemide 60mg  per day x 2 days and then resume his previous dosage of 40 mg a day.  Advised should he experience any side effects such as vomiting, nausea, weakness, muscle cramps or palpitations to call office.     Advised will forward to Dr Sallyanne Kuster for review and will call back for any further recommendations.   Copy of note sent to patient's primary care physician, primary cardiologist, and device following physician.  Rosalene Billings, RN, CCM 05/28/2015 3:54 PM

## 2015-05-29 ENCOUNTER — Encounter: Payer: Self-pay | Admitting: Cardiology

## 2015-05-29 LAB — CUP PACEART REMOTE DEVICE CHECK
Brady Statistic AP VP Percent: 2.71 %
Brady Statistic AP VS Percent: 0.04 %
Brady Statistic AS VP Percent: 96.33 %
Brady Statistic RA Percent Paced: 2.75 %
Brady Statistic RV Percent Paced: 99.04 %
Date Time Interrogation Session: 20161121092509
HighPow Impedance: 323 Ohm
HighPow Impedance: 37 Ohm
HighPow Impedance: 49 Ohm
Implantable Lead Implant Date: 20111230
Implantable Lead Implant Date: 20111230
Implantable Lead Location: 753858
Implantable Lead Location: 753859
Implantable Lead Location: 753860
Implantable Lead Model: 5076
Implantable Lead Model: 6947
Lead Channel Impedance Value: 1311 Ohm
Lead Channel Impedance Value: 665 Ohm
Lead Channel Pacing Threshold Amplitude: 0.75 V
Lead Channel Pacing Threshold Pulse Width: 0.4 ms
Lead Channel Pacing Threshold Pulse Width: 1.5 ms
Lead Channel Sensing Intrinsic Amplitude: 1.625 mV
Lead Channel Sensing Intrinsic Amplitude: 1.625 mV
Lead Channel Sensing Intrinsic Amplitude: 14.125 mV
Lead Channel Setting Pacing Amplitude: 2 V
Lead Channel Setting Pacing Pulse Width: 1.5 ms
Lead Channel Setting Sensing Sensitivity: 0.3 mV
MDC IDC LEAD IMPLANT DT: 20111230
MDC IDC LEAD MODEL: 4196
MDC IDC MSMT BATTERY VOLTAGE: 2.64 V
MDC IDC MSMT LEADCHNL LV IMPEDANCE VALUE: 836 Ohm
MDC IDC MSMT LEADCHNL LV PACING THRESHOLD AMPLITUDE: 4 V
MDC IDC MSMT LEADCHNL RA IMPEDANCE VALUE: 380 Ohm
MDC IDC MSMT LEADCHNL RV IMPEDANCE VALUE: 380 Ohm
MDC IDC MSMT LEADCHNL RV PACING THRESHOLD AMPLITUDE: 1.125 V
MDC IDC MSMT LEADCHNL RV PACING THRESHOLD PULSEWIDTH: 0.4 ms
MDC IDC MSMT LEADCHNL RV SENSING INTR AMPL: 14.125 mV
MDC IDC SET LEADCHNL LV PACING AMPLITUDE: 3 V
MDC IDC SET LEADCHNL RV PACING AMPLITUDE: 2.5 V
MDC IDC SET LEADCHNL RV PACING PULSEWIDTH: 0.4 ms
MDC IDC STAT BRADY AS VS PERCENT: 0.91 %

## 2015-06-28 ENCOUNTER — Ambulatory Visit (INDEPENDENT_AMBULATORY_CARE_PROVIDER_SITE_OTHER): Payer: Medicare Other | Admitting: *Deleted

## 2015-06-28 DIAGNOSIS — I5022 Chronic systolic (congestive) heart failure: Secondary | ICD-10-CM

## 2015-06-28 DIAGNOSIS — Z95810 Presence of automatic (implantable) cardiac defibrillator: Secondary | ICD-10-CM

## 2015-06-28 NOTE — Progress Notes (Signed)
Remote ICD transmission.   

## 2015-07-03 LAB — CUP PACEART REMOTE DEVICE CHECK
Brady Statistic AP VP Percent: 1.09 %
Brady Statistic AS VP Percent: 95.94 %
Brady Statistic AS VS Percent: 2.91 %
Brady Statistic RV Percent Paced: 97.03 %
HIGH POWER IMPEDANCE MEASURED VALUE: 304 Ohm
HighPow Impedance: 37 Ohm
HighPow Impedance: 47 Ohm
Implantable Lead Implant Date: 20111230
Implantable Lead Location: 753858
Implantable Lead Location: 753860
Implantable Lead Model: 4196
Implantable Lead Model: 5076
Implantable Lead Model: 6947
Lead Channel Impedance Value: 1349 Ohm
Lead Channel Impedance Value: 342 Ohm
Lead Channel Impedance Value: 665 Ohm
Lead Channel Impedance Value: 836 Ohm
Lead Channel Pacing Threshold Amplitude: 0.75 V
Lead Channel Pacing Threshold Amplitude: 3.5 V
Lead Channel Pacing Threshold Pulse Width: 0.4 ms
Lead Channel Sensing Intrinsic Amplitude: 1.25 mV
Lead Channel Sensing Intrinsic Amplitude: 16.25 mV
Lead Channel Setting Pacing Amplitude: 2 V
Lead Channel Setting Pacing Amplitude: 2.75 V
Lead Channel Setting Pacing Pulse Width: 0.4 ms
Lead Channel Setting Pacing Pulse Width: 1.5 ms
Lead Channel Setting Sensing Sensitivity: 0.3 mV
MDC IDC LEAD IMPLANT DT: 20111230
MDC IDC LEAD IMPLANT DT: 20111230
MDC IDC LEAD LOCATION: 753859
MDC IDC MSMT BATTERY VOLTAGE: 2.63 V
MDC IDC MSMT LEADCHNL LV PACING THRESHOLD PULSEWIDTH: 1.5 ms
MDC IDC MSMT LEADCHNL RA IMPEDANCE VALUE: 380 Ohm
MDC IDC MSMT LEADCHNL RA SENSING INTR AMPL: 1.25 mV
MDC IDC MSMT LEADCHNL RV PACING THRESHOLD AMPLITUDE: 1.375 V
MDC IDC MSMT LEADCHNL RV PACING THRESHOLD PULSEWIDTH: 0.4 ms
MDC IDC MSMT LEADCHNL RV SENSING INTR AMPL: 16.25 mV
MDC IDC SESS DTM: 20161222083430
MDC IDC SET LEADCHNL LV PACING AMPLITUDE: 3 V
MDC IDC STAT BRADY AP VS PERCENT: 0.05 %
MDC IDC STAT BRADY RA PERCENT PACED: 1.15 %

## 2015-07-05 ENCOUNTER — Encounter: Payer: Self-pay | Admitting: Cardiology

## 2015-07-06 NOTE — Progress Notes (Signed)
EPIC Encounter for ICM Monitoring  Patient Name: Dakota Liu is a 81 y.o. male Date: 07/06/2015 Primary Care Physican: Woody Seller, MD Primary Cardiologist: Croitoru Electrophysiologist: Croitoru Dry Weight: Unknown-has not weighed recently       In the past month, have you:  1. Gained more than 2 pounds in a day or more than 5 pounds in a week? unknown  2. Had changes in your medications (with verification of current medications)? no  3. Had more shortness of breath than is usual for you? no  4. Limited your activity because of shortness of breath? no  5. Not been able to sleep because of shortness of breath? no  6. Had increased swelling in your feet or ankles? no  7. Had symptoms of dehydration (dizziness, dry mouth, increased thirst, decreased urine output) no  8. Had changes in sodium restriction? no  9. Been compliant with medication? Yes   ICM trend: 06/28/2015   Follow-up plan: ICM clinic phone appointment on 07/30/2015.  Optivol impedance at time of transmission on 06/28/2015 below baseline 06/11/2015 to 06/27/2015 suggesting fluid retention and returned back to baseline on 06/28/2015.  He reported he has had a GI virus for the last couple of weeks but feeling better today.  He denied any HF symptoms.  Reminded him to limit sodium intake and he stated his body craves salt and hard for him to do without it.  No changes today.    Copy of note sent to patient's primary care physician, primary cardiologist, and device following physician.  Rosalene Billings, RN, CCM 07/06/2015 10:20 AM

## 2015-07-11 NOTE — Progress Notes (Signed)
Sanda Klein, MD   Sent: Sat July 07, 2015 12:12 AM    To: Rosalene Billings, RN        Message     Thanks    ----- Message -----     From: Rosalene Billings, RN     Sent: 07/06/2015 10:23 AM      To: Sanda Klein, MD

## 2015-07-30 ENCOUNTER — Encounter: Payer: Medicare Other | Admitting: *Deleted

## 2015-08-01 ENCOUNTER — Encounter: Payer: Self-pay | Admitting: Cardiology

## 2015-08-01 ENCOUNTER — Telehealth: Payer: Self-pay

## 2015-08-01 NOTE — Telephone Encounter (Signed)
Call to patient and advised no remote ICM transmission received that was scheduled on 07/30/2015.  Requested he send one today and he stated he would do so.

## 2015-08-03 NOTE — Telephone Encounter (Signed)
No remote ICM transmission received.  Next remote transmission scheduled for 10/05/2015 and has an office appointment with Dr Sallyanne Kuster on 09/04/2015 for defib check.  Letter sent with new remote transmission date.

## 2015-08-06 ENCOUNTER — Telehealth: Payer: Self-pay

## 2015-08-06 ENCOUNTER — Ambulatory Visit (INDEPENDENT_AMBULATORY_CARE_PROVIDER_SITE_OTHER): Payer: Medicare Other

## 2015-08-06 DIAGNOSIS — Z95810 Presence of automatic (implantable) cardiac defibrillator: Secondary | ICD-10-CM | POA: Diagnosis not present

## 2015-08-06 DIAGNOSIS — I5022 Chronic systolic (congestive) heart failure: Secondary | ICD-10-CM | POA: Diagnosis not present

## 2015-08-06 NOTE — Progress Notes (Signed)
Please check BMET and BNP - both can wait til his PCP appt. thanks

## 2015-08-06 NOTE — Telephone Encounter (Signed)
Received call from patient and wife.  Patient gave verbal permission to speak with wife, [name] regarding his health.  She stated she received the letter that I did not received ICM remote transmission and they have tried to send one twice.  Attempted to help troubleshoot monitor and was unsuccessful.  Provided [employer] tech support number for further assistance.

## 2015-08-06 NOTE — Progress Notes (Signed)
Call to wife and advised patient will need lab work  and will coordinate the appointment he has with Dr Dois Davenport office on 08/20/2015.  Encouraged her to call if patient has any fluid symptoms.  Advised Dr Sallyanne Kuster will check his device at the next appointment on 09/04/2015.

## 2015-08-06 NOTE — Telephone Encounter (Signed)
Received copy of CMP results from 09/04/2014.  Creatinine 0.80, BUN 11 and Potassium 4.7.  Call to Dr Rich Fuchs office and spoke with Horris Latino.  Requested a copy of lastest CMP results and she stated she would fax the results from 09/04/2014.

## 2015-08-06 NOTE — Progress Notes (Signed)
EPIC Encounter for ICM Monitoring  Patient Name: Dakota Liu is a 82 y.o. male Date: 08/06/2015 Primary Care Physican: Woody Seller, MD Primary Cardiologist: Croitoru Electrophysiologist: Croitoru Dry Weight: Unable to weigh  Bi-V Pacing 92.6% (was 97.2% on 05/28/2015)      In the past month, have you:  1. Gained more than 2 pounds in a day or more than 5 pounds in a week? no  2. Had changes in your medications (with verification of current medications)? no  3. Had more shortness of breath than is usual for you? no  4. Limited your activity because of shortness of breath? no  5. Not been able to sleep because of shortness of breath? no  6. Had increased swelling in your feet or ankles? no  7. Had symptoms of dehydration (dizziness, dry mouth, increased thirst, decreased urine output) no  8. Had changes in sodium restriction? no  9. Been compliant with medication? No, Patient and wife stated he stopped potassium a long time ago but no idea when was the last time he took a dose.  He reported he stopped because he thought it caused constipation.    ICM trend: 3 month view for 08/06/2015   ICM trend: 1 year view for 08/06/2015   Follow-up plan: ICM clinic phone appointment 10/05/2015 and appointment with Dr Sallyanne Kuster on 09/04/2015.  Spoke with wife, [name].  Optivol thoracic impedance slightly below reference line and fluid index > threshold since 07/02/2015 to 1/2302017 and 08/01/2015 to 08/06/2015 suggesting fluid. Wife stated his ankles swell daily but resolve after lying down at night but is no worse than usual.  She reported no other fluid retention symptoms.  She reported he is not very active and is confirmed on ICM transmission that activity is less than 1 hr/day for 5 weeks.  Unable to weigh patient due to balance and instability when standing.      Advised would send to Dr Sallyanne Kuster for review and call back with any recommendations.  Patient has appointment with Dr Redmond Pulling,  PCP on 08/19/2014 and wife stated if labs are needed, they can be drawn at his office.    Patient has stopped taking Potassium but unable to determine how long ago but he reported it was a long time ago.    Received faxed copy of CMP results from Dr Dois Davenport office.  Latest lab results from 09/04/2014, Potassium 4.7, Creatinine 0.80 and BUN 11.   Copy of note sent to patient's primary care physician, primary cardiologist, and device following physician.  Rosalene Billings, RN, CCM 08/06/2015 10:39 AM

## 2015-08-07 ENCOUNTER — Telehealth: Payer: Self-pay

## 2015-08-07 NOTE — Telephone Encounter (Signed)
Call to Dr Dois Davenport office and spoke with Gerald Stabs.  Advised Dr Sallyanne Kuster has ordered a BMET and BNP and patient prefers to have labs drawn at the same time of his visit with Dr Redmond Pulling on 08/20/2015.  Explained patient and wife prefer labs to be drawn at Dr Dois Davenport office since it is easier for patient to get to that office.  She stated she would add the request and if she has any problems with call back.  Provided my name and number.

## 2015-08-20 DIAGNOSIS — C8291 Follicular lymphoma, unspecified, lymph nodes of head, face, and neck: Secondary | ICD-10-CM | POA: Insufficient documentation

## 2015-08-20 DIAGNOSIS — G629 Polyneuropathy, unspecified: Secondary | ICD-10-CM | POA: Insufficient documentation

## 2015-08-20 DIAGNOSIS — E785 Hyperlipidemia, unspecified: Secondary | ICD-10-CM | POA: Insufficient documentation

## 2015-08-20 DIAGNOSIS — K219 Gastro-esophageal reflux disease without esophagitis: Secondary | ICD-10-CM | POA: Insufficient documentation

## 2015-08-20 DIAGNOSIS — I714 Abdominal aortic aneurysm, without rupture, unspecified: Secondary | ICD-10-CM | POA: Insufficient documentation

## 2015-08-20 DIAGNOSIS — R5381 Other malaise: Secondary | ICD-10-CM | POA: Insufficient documentation

## 2015-08-20 DIAGNOSIS — G8220 Paraplegia, unspecified: Secondary | ICD-10-CM | POA: Insufficient documentation

## 2015-08-20 DIAGNOSIS — M21379 Foot drop, unspecified foot: Secondary | ICD-10-CM | POA: Insufficient documentation

## 2015-08-20 DIAGNOSIS — R5383 Other fatigue: Secondary | ICD-10-CM

## 2015-08-20 DIAGNOSIS — I10 Essential (primary) hypertension: Secondary | ICD-10-CM | POA: Insufficient documentation

## 2015-08-20 DIAGNOSIS — Z79899 Other long term (current) drug therapy: Secondary | ICD-10-CM | POA: Insufficient documentation

## 2015-08-21 ENCOUNTER — Observation Stay (HOSPITAL_COMMUNITY)
Admission: EM | Admit: 2015-08-21 | Discharge: 2015-08-22 | Disposition: A | Discharge status: Home / Self Care | Payer: Medicare Other | Attending: Internal Medicine | Admitting: Internal Medicine

## 2015-08-21 ENCOUNTER — Encounter (HOSPITAL_COMMUNITY): Payer: Self-pay

## 2015-08-21 DIAGNOSIS — D509 Iron deficiency anemia, unspecified: Principal | ICD-10-CM | POA: Diagnosis present

## 2015-08-21 DIAGNOSIS — E785 Hyperlipidemia, unspecified: Secondary | ICD-10-CM | POA: Diagnosis not present

## 2015-08-21 DIAGNOSIS — K219 Gastro-esophageal reflux disease without esophagitis: Secondary | ICD-10-CM | POA: Insufficient documentation

## 2015-08-21 DIAGNOSIS — Z7982 Long term (current) use of aspirin: Secondary | ICD-10-CM | POA: Insufficient documentation

## 2015-08-21 DIAGNOSIS — I110 Hypertensive heart disease with heart failure: Secondary | ICD-10-CM | POA: Diagnosis not present

## 2015-08-21 DIAGNOSIS — I2510 Atherosclerotic heart disease of native coronary artery without angina pectoris: Secondary | ICD-10-CM | POA: Diagnosis present

## 2015-08-21 DIAGNOSIS — Z95810 Presence of automatic (implantable) cardiac defibrillator: Secondary | ICD-10-CM | POA: Diagnosis present

## 2015-08-21 DIAGNOSIS — I447 Left bundle-branch block, unspecified: Secondary | ICD-10-CM | POA: Diagnosis not present

## 2015-08-21 DIAGNOSIS — Z79899 Other long term (current) drug therapy: Secondary | ICD-10-CM | POA: Insufficient documentation

## 2015-08-21 DIAGNOSIS — I480 Paroxysmal atrial fibrillation: Secondary | ICD-10-CM | POA: Diagnosis not present

## 2015-08-21 DIAGNOSIS — I255 Ischemic cardiomyopathy: Secondary | ICD-10-CM | POA: Diagnosis not present

## 2015-08-21 DIAGNOSIS — E876 Hypokalemia: Secondary | ICD-10-CM | POA: Insufficient documentation

## 2015-08-21 DIAGNOSIS — D638 Anemia in other chronic diseases classified elsewhere: Secondary | ICD-10-CM | POA: Diagnosis present

## 2015-08-21 DIAGNOSIS — I739 Peripheral vascular disease, unspecified: Secondary | ICD-10-CM | POA: Diagnosis not present

## 2015-08-21 DIAGNOSIS — Z8572 Personal history of non-Hodgkin lymphomas: Secondary | ICD-10-CM | POA: Insufficient documentation

## 2015-08-21 DIAGNOSIS — Z7902 Long term (current) use of antithrombotics/antiplatelets: Secondary | ICD-10-CM | POA: Diagnosis not present

## 2015-08-21 DIAGNOSIS — Z951 Presence of aortocoronary bypass graft: Secondary | ICD-10-CM | POA: Diagnosis not present

## 2015-08-21 DIAGNOSIS — I5022 Chronic systolic (congestive) heart failure: Secondary | ICD-10-CM

## 2015-08-21 DIAGNOSIS — D649 Anemia, unspecified: Secondary | ICD-10-CM | POA: Diagnosis not present

## 2015-08-21 DIAGNOSIS — R5383 Other fatigue: Secondary | ICD-10-CM | POA: Diagnosis present

## 2015-08-21 DIAGNOSIS — I5023 Acute on chronic systolic (congestive) heart failure: Secondary | ICD-10-CM | POA: Diagnosis present

## 2015-08-21 LAB — BASIC METABOLIC PANEL
ANION GAP: 11 (ref 5–15)
Anion gap: 9 (ref 5–15)
BUN: 11 mg/dL (ref 6–20)
BUN: 10 mg/dL (ref 6–20)
CALCIUM: 8.5 mg/dL — AB (ref 8.9–10.3)
CALCIUM: 8.6 mg/dL — AB (ref 8.9–10.3)
CO2: 31 mmol/L (ref 22–32)
CO2: 30 mmol/L (ref 22–32)
CREATININE: 0.71 mg/dL (ref 0.61–1.24)
Chloride: 95 mmol/L — ABNORMAL LOW (ref 101–111)
Chloride: 99 mmol/L — ABNORMAL LOW (ref 101–111)
Creatinine, Ser: 0.9 mg/dL (ref 0.61–1.24)
GFR calc Af Amer: >60 mL/min (ref >60)
GFR calc Af Amer: >60 mL/min (ref >60)
GFR calc non Af Amer: >60 mL/min (ref >60)
GFR calc non Af Amer: >60 mL/min (ref >60)
GLUCOSE: 156 mg/dL — AB (ref 65–99)
GLUCOSE: 162 mg/dL — AB (ref 65–99)
POTASSIUM: 2.6 mmol/L — AB (ref 3.5–5.1)
Potassium: 3.5 mmol/L (ref 3.5–5.1)
Sodium: 137 mmol/L (ref 135–145)
Sodium: 138 mmol/L (ref 135–145)

## 2015-08-21 LAB — URINALYSIS, ROUTINE W REFLEX MICROSCOPIC
Glucose, UA: NEGATIVE mg/dL
HGB URINE DIPSTICK: NEGATIVE
Ketones, ur: NEGATIVE mg/dL
Leukocytes, UA: NEGATIVE
Nitrite: NEGATIVE
PH: 7.5 (ref 5.0–8.0)
Protein, ur: NEGATIVE mg/dL
SPECIFIC GRAVITY, URINE: 1.024 (ref 1.005–1.030)

## 2015-08-21 LAB — ABO/RH: ABO/RH(D): O POS

## 2015-08-21 LAB — IRON AND TIBC
Iron: 15 ug/dL — ABNORMAL LOW (ref 45–182)
SATURATION RATIOS: 3 % — AB (ref 17.9–39.5)
TIBC: 466 ug/dL — ABNORMAL HIGH (ref 250–450)
UIBC: 451 ug/dL

## 2015-08-21 LAB — RETICULOCYTES
RBC.: 2.73 MIL/uL — AB (ref 4.22–5.81)
RETIC CT PCT: 2.2 % (ref 0.4–3.1)
Retic Count, Absolute: 60.1 10*3/uL (ref 19.0–186.0)

## 2015-08-21 LAB — HEPATIC FUNCTION PANEL
ALK PHOS: 63 U/L (ref 38–126)
ALT: 9 U/L — ABNORMAL LOW (ref 17–63)
AST: 17 U/L (ref 15–41)
Albumin: 3.5 g/dL (ref 3.5–5.0)
BILIRUBIN TOTAL: 0.9 mg/dL (ref 0.3–1.2)
TOTAL PROTEIN: 6.1 g/dL — AB (ref 6.5–8.1)

## 2015-08-21 LAB — CBC
HEMATOCRIT: 19.7 % — AB (ref 39.0–52.0)
HEMOGLOBIN: 5.3 g/dL — AB (ref 13.0–17.0)
MCH: 19.4 pg — AB (ref 26.0–34.0)
MCHC: 26.9 g/dL — AB (ref 30.0–36.0)
MCV: 72.2 fL — ABNORMAL LOW (ref 78.0–100.0)
Platelets: 203 10*3/uL (ref 150–400)
RBC: 2.73 MIL/uL — ABNORMAL LOW (ref 4.22–5.81)
RDW: 23 % — AB (ref 11.5–15.5)
WBC: 4.9 10*3/uL (ref 4.0–10.5)

## 2015-08-21 LAB — VITAMIN B12: Vitamin B-12: 338 pg/mL (ref 180–914)

## 2015-08-21 LAB — PROTIME-INR
INR: 1.21 (ref 0.00–1.49)
Prothrombin Time: 15.5 seconds — ABNORMAL HIGH (ref 11.6–15.2)

## 2015-08-21 LAB — LACTATE DEHYDROGENASE: LDH: 157 U/L (ref 98–192)

## 2015-08-21 LAB — FERRITIN: FERRITIN: 7 ng/mL — AB (ref 24–336)

## 2015-08-21 LAB — FOLATE: Folate: 20.2 ng/mL (ref >5.9)

## 2015-08-21 LAB — PREPARE RBC (CROSSMATCH)

## 2015-08-21 LAB — MAGNESIUM: Magnesium: 1.9 mg/dL (ref 1.7–2.4)

## 2015-08-21 LAB — POC OCCULT BLOOD, ED: Fecal Occult Bld: NEGATIVE

## 2015-08-21 MED ORDER — SODIUM CHLORIDE 0.9 % IV SOLN: 250.0000 mL | INTRAVENOUS | Status: DC | PRN

## 2015-08-21 MED ORDER — SODIUM CHLORIDE 0.9 % IV SOLN: Freq: Once | INTRAVENOUS | Status: DC

## 2015-08-21 MED ORDER — POTASSIUM CHLORIDE CRYS ER 20 MEQ PO TBCR
60.0000 meq | EXTENDED_RELEASE_TABLET | Freq: Once | ORAL | Status: AC
  Administered 2015-08-21: 60 meq via ORAL
  Filled 2015-08-21: qty 3

## 2015-08-21 MED ORDER — SODIUM CHLORIDE 0.9 % IV SOLN
10.0000 mL/h | Freq: Once | INTRAVENOUS | Status: AC
  Administered 2015-08-21: 10 mL/h via INTRAVENOUS

## 2015-08-21 MED ORDER — FUROSEMIDE 10 MG/ML IJ SOLN
20.0000 mg | Freq: Once | INTRAMUSCULAR | Status: AC
  Administered 2015-08-21: 20 mg via INTRAVENOUS

## 2015-08-21 MED ORDER — HYDROCOD POLST-CPM POLST ER 10-8 MG/5ML PO SUER
5.0000 mL | Freq: Once | ORAL | Status: AC
  Administered 2015-08-21: 5 mL via ORAL
  Filled 2015-08-21: qty 5

## 2015-08-21 MED ORDER — ONDANSETRON HCL 4 MG PO TABS: 4.0000 mg | ORAL_TABLET | Freq: Four times a day (QID) | ORAL | Status: DC | PRN

## 2015-08-21 MED ORDER — FUROSEMIDE 20 MG PO TABS
60.0000 mg | ORAL_TABLET | Freq: Every day | ORAL | Status: DC
  Administered 2015-08-22: 60 mg via ORAL
  Filled 2015-08-21: qty 1

## 2015-08-21 MED ORDER — ACETAMINOPHEN 325 MG PO TABS
650.0000 mg | ORAL_TABLET | Freq: Four times a day (QID) | ORAL | Status: DC | PRN
Start: 2015-08-21 — End: 2015-08-22

## 2015-08-21 MED ORDER — PANTOPRAZOLE SODIUM 40 MG PO TBEC
40.0000 mg | DELAYED_RELEASE_TABLET | Freq: Every day | ORAL | Status: DC
  Administered 2015-08-21 – 2015-08-22 (×2): 40 mg via ORAL
  Filled 2015-08-21 (×2): qty 1

## 2015-08-21 MED ORDER — ACETAMINOPHEN 650 MG RE SUPP: 650.0000 mg | Freq: Four times a day (QID) | RECTAL | Status: DC | PRN

## 2015-08-21 MED ORDER — RANOLAZINE ER 500 MG PO TB12
500.0000 mg | ORAL_TABLET | Freq: Two times a day (BID) | ORAL | Status: DC
  Administered 2015-08-21 – 2015-08-22 (×2): 500 mg via ORAL
  Filled 2015-08-21 (×3): qty 1

## 2015-08-21 MED ORDER — SODIUM CHLORIDE 0.9% FLUSH
3.0000 mL | Freq: Two times a day (BID) | INTRAVENOUS | Status: DC
  Administered 2015-08-21 – 2015-08-22 (×2): 3 mL via INTRAVENOUS

## 2015-08-21 MED ORDER — FUROSEMIDE 10 MG/ML IJ SOLN
20.0000 mg | Freq: Once | INTRAMUSCULAR | Status: DC
  Filled 2015-08-21: qty 2

## 2015-08-21 MED ORDER — SODIUM CHLORIDE 0.9% FLUSH: 3.0000 mL | INTRAVENOUS | Status: DC | PRN

## 2015-08-21 MED ORDER — SIMVASTATIN 40 MG PO TABS
40.0000 mg | ORAL_TABLET | Freq: Every day | ORAL | Status: DC
  Administered 2015-08-21: 40 mg via ORAL
  Filled 2015-08-21: qty 1

## 2015-08-21 MED ORDER — SODIUM CHLORIDE 0.9% FLUSH
3.0000 mL | Freq: Two times a day (BID) | INTRAVENOUS | Status: DC
  Administered 2015-08-21: 3 mL via INTRAVENOUS

## 2015-08-21 MED ORDER — ONDANSETRON HCL 4 MG/2ML IJ SOLN: 4.0000 mg | Freq: Four times a day (QID) | INTRAMUSCULAR | Status: DC | PRN

## 2015-08-21 NOTE — H&P (Signed)
[facility] History and Physical  Dakota Liu:[id number] DOB: 15-Nov-1927 DOA: 08/21/2015   PCP: Woody Seller, MD  Specialists: Dr. Sallyanne Kuster is his cardiologist  Chief Complaint: Fatigue and dyspnea on exertion  HPI: Dakota Liu is a 82 y.o. male with a past medical history of coronary artery disease status post CABG and stent placement, history of chronic systolic congestive heart failure, defibrillator in situ, iron deficiency anemia, who stopped taking his iron tablets about a year ago. Patient presented to his primary care physician's office with complaints of weakness, shortness of breath with exertion and fatigue ongoing for 2 months. Hemoglobin was checked and was found to be low. Patient was referred to the emergency department. He denies any nausea, vomiting, diarrhea. No chest pain currently. Denies any blood in the stool or black stool. Denies any blood in the urine. Denies any other bleeding anywhere. He last had a colonoscopy about 10 years ago, which did not reveal any abnormalities. He admits to only occasional acid reflux, not on a consistent basis. He did tell me that he stopped taking his iron about a year ago. He used to be followed by Dr. Ralene Ok.  In the emergency department, evaluation has revealed severe anemia which is microcytic. He will need to be transfused. Stool was negative for occult blood.  Home Medications: Prior to Admission medications   Medication Sig Start Date End Date Taking? Authorizing Provider  aspirin 81 MG tablet Take 81 mg by mouth daily.   Yes Historical Provider, MD  clopidogrel (PLAVIX) 75 MG tablet Take 75 mg by mouth daily.     Yes Historical Provider, MD  furosemide (LASIX) 20 MG tablet Take 3 tablets (60 mg total) by mouth daily. 05/24/15  Yes Mihai Croitoru, MD  KLOR-CON M20 20 MEQ tablet TAKE 1 TABLET BY MOUTH EVERY DAY 01/22/15  Yes Leonie Man, MD  nitroGLYCERIN (NITROSTAT) 0.4 MG SL tablet Place 0.4 mg under the  tongue every 5 (five) minutes as needed.     Yes Historical Provider, MD  omeprazole (PRILOSEC) 20 MG capsule Take 20 mg by mouth daily as needed (acid reflux).    Yes Historical Provider, MD  ranolazine (RANEXA) 500 MG 12 hr tablet Take 1 tablet (500 mg total) by mouth 2 (two) times daily. 05/04/13  Yes Cherene Altes, MD  simvastatin (ZOCOR) 40 MG tablet Take 40 mg by mouth daily. 07/14/13  Yes Historical Provider, MD    Allergies:  Allergies  Allergen Reactions  . Tramadol Nausea And Vomiting and Other (See Comments)    Dry heaving, GI upset  . Ace Inhibitors Cough  . Codeine Other (See Comments)  . Simvastatin     Myalgia- high doses (takes 3m at home without issue)    Past Medical History: Past Medical History  Diagnosis Date  . HTN (hypertension)   . Dyslipidemia   . Lymphoma ([place]     with metal radiation about 2006  . Epididymitis   . PAF (paroxysmal atrial fibrillation) ([facility] 12/11  . Anemia, iron deficiency 11/21/2011  . CAD (coronary artery disease) '88, 12/11, 3/12    CABG'88, PCI 12/1, 3/12  . Ischemic cardiomyopathy 10/14    EF improved to 40-45% after BiV ICD  . CHF (congestive heart failure) ([facility]   . VT ([place]ricular tachycardia) ([facility] 12/11  . PVD (peripheral vascular disease) ([facility] 11/10    AAA R&G  . LBBB (left bundle branch block)     Past Surgical History  Procedure Laterality Date  .  Tonsillectomy    . Orchiectomy    . Coronary artery bypass graft  1988  . Abdominal aortic aneurysm repair  11/10  . Cardiac defibrillator placement  12/11  . Coronary angioplasty with stent placement  07/05/10, 3/12    RCA with ISR 3/12  . Left heart catheterization with coronary/graft angiogram N/A 05/03/2013    Procedure: LEFT HEART CATHETERIZATION WITH Beatrix Fetters;  Surgeon: Peter M Martinique, MD;  Location: [facility] CATH LAB;  Service: Cardiovascular;  Laterality: N/A;    Social History: Patient lives in [place] with his wife. He quit smoking 31  years ago. Denies any alcohol use. Uses a walker to ambulate.  Family History:  Family History  Problem Relation Age of Onset  . Diabetes Mother   . Heart Problems Mother   . Stroke Father   . Cancer Sister      Review of Systems - History obtained from the patient General ROS: positive for  - fatigue Psychological ROS: negative Ophthalmic ROS: negative ENT ROS: negative Allergy and Immunology ROS: negative Hematological and Lymphatic ROS: negative Endocrine ROS: negative Respiratory ROS: as in hpi Cardiovascular ROS: as in hpi Gastrointestinal ROS: no abdominal pain, change in bowel habits, or black or bloody stools Genito-Urinary ROS: no dysuria, trouble voiding, or hematuria Musculoskeletal ROS: negative Neurological ROS: no TIA or stroke symptoms Dermatological ROS: negative  Physical Examination  Filed Vitals:   08/21/15 0756  BP: 124/68  Pulse: 89  Temp: 97.4 F (36.3 C)  TempSrc: Oral  Resp: 16  SpO2: 95%    BP 124/68 mmHg  Pulse 89  Temp(Src) 97.4 F (36.3 C) (Oral)  Resp 16  SpO2 95%  General appearance: alert, cooperative, appears stated age and no distress Head: Normocephalic, without obvious abnormality, atraumatic Eyes: Pallor present. No icterus Throat: lips, mucosa, and tongue normal; teeth and gums normal Neck: no adenopathy, no carotid bruit, no JVD, supple, symmetrical, trachea midline and thyroid not enlarged, symmetric, no tenderness/mass/nodules Resp: clear to auscultation bilaterally Cardio: regular rate and rhythm, S1, S2 normal, no murmur, click, rub or gallop GI: soft, non-tender; bowel sounds normal; no masses,  no organomegaly Extremities: extremities normal, atraumatic, no cyanosis or edema Pulses: 2+ and symmetric Skin: Extremely pale Lymph nodes: Cervical, supraclavicular, and axillary nodes normal. Neurologic: Awake and alert. Oriented 3. No focal neurological deficits.  Laboratory Data: Results for orders placed or  performed during the hospital encounter of 08/21/15 (from the past 48 hour(s))  Basic metabolic panel     Status: Abnormal   Collection Time: 08/21/15  8:12 AM  Result Value Ref Range   Sodium 137 135 - 145 mmol/L   Potassium 2.6 (LL) 3.5 - 5.1 mmol/L    Comment: CRITICAL RESULT CALLED TO, READ BACK BY AND VERIFIED WITH: LASSITER,A. RN AT 0930 08/21/15 MULLINS,T    Chloride 95 (L) 101 - 111 mmol/L   CO2 31 22 - 32 mmol/L   Glucose, Bld 156 (H) 65 - 99 mg/dL   BUN 11 6 - 20 mg/dL   Creatinine, Ser 0.90 0.61 - 1.24 mg/dL   Calcium 8.5 (L) 8.9 - 10.3 mg/dL   GFR calc non Af Amer >60 >60 mL/min   GFR calc Af Amer >60 >60 mL/min    Comment: (NOTE) The eGFR has been calculated using the CKD EPI equation. This calculation has not been validated in all clinical situations. eGFR's persistently <60 mL/min signify possible Chronic Kidney Disease.    Anion gap 11 5 - 15  CBC  Status: Abnormal   Collection Time: 08/21/15  8:12 AM  Result Value Ref Range   WBC 4.9 4.0 - 10.5 K/uL   RBC 2.73 (L) 4.22 - 5.81 MIL/uL   Hemoglobin 5.3 (LL) 13.0 - 17.0 g/dL    Comment: REPEATED TO VERIFY CRITICAL RESULT CALLED TO, READ BACK BY AND VERIFIED WITH: J. HAMILTON RN AT 0840 ON 02.14.17 BY SHUEA    HCT 19.7 (L) 39.0 - 52.0 %   MCV 72.2 (L) 78.0 - 100.0 fL   MCH 19.4 (L) 26.0 - 34.0 pg   MCHC 26.9 (L) 30.0 - 36.0 g/dL   RDW 23.0 (H) 11.5 - 15.5 %   Platelets 203 150 - 400 K/uL  Reticulocytes     Status: Abnormal   Collection Time: 08/21/15  8:12 AM  Result Value Ref Range   Retic Ct Pct 2.2 0.4 - 3.1 %   RBC. 2.73 (L) 4.22 - 5.81 MIL/uL   Retic Count, Manual 60.1 19.0 - 186.0 K/uL  Protime-INR     Status: Abnormal   Collection Time: 08/21/15  8:12 AM  Result Value Ref Range   Prothrombin Time 15.5 (H) 11.6 - 15.2 seconds   INR 1.21 0.00 - 1.49  Type and screen [place]     Status: None   Collection Time: 08/21/15  8:12 AM  Result Value Ref Range   ABO/RH(D) O POS     Antibody Screen NEG    Sample Expiration 08/24/2015   ABO/Rh     Status: None   Collection Time: 08/21/15  8:12 AM  Result Value Ref Range   ABO/RH(D) O POS   POC occult blood, ED     Status: None   Collection Time: 08/21/15  9:41 AM  Result Value Ref Range   Fecal Occult Bld NEGATIVE NEGATIVE  Prepare RBC     Status: None   Collection Time: 08/21/15  9:47 AM  Result Value Ref Range   Order Confirmation ORDER PROCESSED BY BLOOD BANK     Radiology Reports: No results found.   Problem List  Principal Problem:   Symptomatic anemia Active Problems:   Ischemic cardiomyopathy- EF improved to 40-45% 82/95   Chronic systolic congestive heart failure (HCC)   PAF - 12/11- (no Coumadin secondary to unsteady gait)   Biventricular implantable cardioverter-defibrillator in situ   Anemia, iron deficiency   CAD - CABG '88, RCA Stent 12/11, ISR rx'd with HSRA 3/12; patent grafts on cath 10/14   PVD (peripheral vascular disease)- AAA R&G Nov 2010   Assessment: This is a 82 year old Caucasian male with a past medical history as stated earlier, who presents with 2 month history of weakness, fatigue and dyspnea on exertion and is found to have severe anemia. He has a known history of iron deficiency anemia and stopped taking his iron tablets about a year ago. I called his primary care physician's office. His hemoglobin last year was 9.6. He did mention that his hemoglobin has been slowly trending down over the last couple years. He also is noted to have severe hypokalemia.  Plan: #1 Symptomatic anemia: He will be transfused 3 units of PRBCs. He'll be given Lasix in between his transfusions. Anemia panel has been ordered and is pending. Check LDH. Check LFTs. The reason for his anemia is most likely noncompliance with his iron tablets. Stool for occult blood is negative. No evidence for overt bleeding. Does not need inpatient GI workup.  #2 History of CAD status post CABG and  stent placement to  the RCA in 2011. Patient is followed closely by cardiology. He is on aspirin and Plavix. These can be resumed tomorrow if there is no evidence for overt bleeding.  #3 chronic systolic congestive heart failure: He is appears to be well compensated. He'll be given Lasix with his blood transfusion. Resume his oral Lasix from tomorrow. Echocardiogram from 2014 showed EF of 40-45%.  #4 Severe hypokalemia: He is on Lasix at home, which could be the reason for his low potassium. Potassium will be aggressively repleted. Check magnesium level.  #5 history of paroxysmal atrial fibrillation: Rate is reasonably well controlled. Patient is not noted to be on any rate limiting drugs. Cardiology notes were reviewed from last year. Apparently, patient has taken himself off of beta blocker as well as the ARB as it was causing too much dizziness. Monitor on telemetry for now. Patient has a biventricular implantable cardioverter defibrillator in situ.  #6 Iron deficiency anemia: Patient has been told that he needs to be compliant with his iron tablets. He tells me that he gets extremely constipated. Stool softeners can be prescribed with iron tablets. The other option is to consider iron infusions. These can be pursued as an outpatient.   DVT Prophylaxis: SCDs Code Status: DO NOT RESUSCITATE Family Communication: Discussed with the patient and his wife  Disposition Plan: Observe to telemetry   Further management decisions will depend on results of further testing and patient's response to treatment.   [facility]  [facility] Pager [phone]  If 7PM-7AM, please contact night-coverage [email or web address] Password [facility]  08/21/2015, 10:46 AM

## 2015-08-21 NOTE — Progress Notes (Signed)
Pt arrived from ED and RN reported that the PRBC that was infusing was the second unit the pt had received. Care order by MD said to give 3 units total. There were 2 orders for Lasix IV. ED RN reported that pt received no Lasix between the first and second unit. Lasix IV 20mg  given prior to the third unit and third unit started. Callie Fielding RN

## 2015-08-21 NOTE — ED Notes (Signed)
EKG PERFORMED AT BS. EDPA AWARE

## 2015-08-21 NOTE — ED Notes (Signed)
Pt given lunch tray.

## 2015-08-21 NOTE — ED Notes (Signed)
ED PA at bedside

## 2015-08-21 NOTE — ED Notes (Addendum)
Pt. Unable to urinate at this time. Pt. Aware of given a urine specimen. Nurse aware.  Urinal at the bedside.

## 2015-08-21 NOTE — ED Notes (Signed)
Pt c/o increasing fatigue x "2-3 months."  Denies pain.  Pt reports having blood work completed at PCP yesterday and sts "they called me last night and said I need to come here.  He said my blood was really low."  Pt and wife were not given Hgb result.

## 2015-08-21 NOTE — ED Provider Notes (Signed)
CSN: [id number]     Arrival date & time 08/21/15  0750 History   First MD Initiated Contact with Patient 08/21/15 0840     Chief Complaint  Patient presents with  . Abnormal Lab  . Fatigue     (Consider location/radiation/quality/duration/timing/severity/associated sxs/prior Treatment) The history is provided by the patient and the spouse.    Pt with hx afib, CAD, CHF, lymphoma on plavix presents with gradually worsening fatigue for 2-3 months.  Was sent in by PCP after blood tests showed significant anemia.  Reports weakness, shortness of breath, fatigue with activity.  Denies any chest pain or any pain anywhere.  Denies fevers, recent illness.  Has cough that is chronic, unchanged from baseline.  Has not noticed any abnormal bleeding.  Has never had a blood transfusion previously.  PCP is Dr Kathryne Eriksson, [place].   Past Medical History  Diagnosis Date  . HTN (hypertension)   . Dyslipidemia   . Lymphoma ([place])     with metal radiation about 2006  . Epididymitis   . PAF (paroxysmal atrial fibrillation) ([place]) 12/11  . Anemia, iron deficiency 11/21/2011  . CAD (coronary artery disease) '88, 12/11, 3/12    CABG'88, PCI 12/1, 3/12  . Ischemic cardiomyopathy 10/14    EF improved to 40-45% after BiV ICD  . CHF (congestive heart failure) ([place])   . VT (ventricular tachycardia) ([place]) 12/11  . PVD (peripheral vascular disease) ([place]) 11/10    AAA R&G  . LBBB (left bundle branch block)    Past Surgical History  Procedure Laterality Date  . Tonsillectomy    . Orchiectomy    . Coronary artery bypass graft  1988  . Abdominal aortic aneurysm repair  11/10  . Cardiac defibrillator placement  12/11  . Coronary angioplasty with stent placement  07/05/10, 3/12    RCA with ISR 3/12  . Left heart catheterization with coronary/graft angiogram N/A 05/03/2013    Procedure: LEFT HEART CATHETERIZATION WITH Beatrix Fetters;  Surgeon: Peter M Martinique, MD;  Location: [facility] CATH LAB;  Service:  Cardiovascular;  Laterality: N/A;   Family History  Problem Relation Age of Onset  . Diabetes Mother   . Heart Problems Mother   . Stroke Father   . Cancer Sister    Social History  Substance Use Topics  . Smoking status: Former Smoker -- 1.00 packs/day for 44 years    Types: Cigarettes    Quit date: 07/07/1984  . Smokeless tobacco: Never Used  . Alcohol Use: No    Review of Systems  Gastrointestinal: Positive for constipation.  All other systems reviewed and are negative.     Allergies  Tramadol; Ace inhibitors; and Simvastatin  Home Medications   Prior to Admission medications   Medication Sig Start Date End Date Taking? Authorizing Provider  aspirin 81 MG tablet Take 81 mg by mouth daily.    Historical Provider, MD  clopidogrel (PLAVIX) 75 MG tablet Take 75 mg by mouth daily.      Historical Provider, MD  furosemide (LASIX) 20 MG tablet Take 3 tablets (60 mg total) by mouth daily. 05/24/15   Mihai Croitoru, MD  iron polysaccharides (NIFEREX) 150 MG capsule Take 150 mg by mouth daily.    Historical Provider, MD  KLOR-CON M20 20 MEQ tablet TAKE 1 TABLET BY MOUTH EVERY DAY 01/22/15   Leonie Man, MD  nitroGLYCERIN (NITROSTAT) 0.4 MG SL tablet Place 0.4 mg under the tongue every 5 (five) minutes as needed.  Historical Provider, MD  omeprazole (PRILOSEC) 20 MG capsule Take 20 mg by mouth daily.      Historical Provider, MD  potassium chloride SA (K-DUR,KLOR-CON) 20 MEQ tablet Take 1 tablet (20 mEq total) by mouth 2 (two) times daily. 09/22/13   Mihai Croitoru, MD  ranolazine (RANEXA) 500 MG 12 hr tablet Take 1 tablet (500 mg total) by mouth 2 (two) times daily. 05/04/13   Cherene Altes, MD  simvastatin (ZOCOR) 40 MG tablet Take 40 mg by mouth daily. 07/14/13   Historical Provider, MD   BP 124/68 mmHg  Pulse 89  Temp(Src) 97.4 F (36.3 C) (Oral)  Resp 16  SpO2 95% Physical Exam  Constitutional: He appears well-developed and well-nourished. No distress.  HENT:   Head: Normocephalic and atraumatic.  Eyes: EOM are normal.  Neck: Normal range of motion. Neck supple.  Cardiovascular: Normal rate.  An irregular rhythm present.  Pulmonary/Chest: Effort normal and breath sounds normal. No respiratory distress. He has no wheezes. He has no rales.  Abdominal: Soft. He exhibits no distension and no mass. There is no tenderness. There is no rebound and no guarding.  Genitourinary: Rectum normal.  Neurological: He is alert. He exhibits normal muscle tone.  Skin: He is not diaphoretic. There is pallor.  Nursing note and vitals reviewed.   ED Course  Procedures (including critical care time) Labs Review Labs Reviewed  BASIC METABOLIC PANEL - Abnormal; Notable for the following:    Potassium 2.6 (*)    Chloride 95 (*)    Glucose, Bld 156 (*)    Calcium 8.5 (*)    All other components within normal limits  CBC - Abnormal; Notable for the following:    RBC 2.73 (*)    Hemoglobin 5.3 (*)    HCT 19.7 (*)    MCV 72.2 (*)    MCH 19.4 (*)    MCHC 26.9 (*)    RDW 23.0 (*)    All other components within normal limits  RETICULOCYTES - Abnormal; Notable for the following:    RBC. 2.73 (*)    All other components within normal limits  PROTIME-INR - Abnormal; Notable for the following:    Prothrombin Time 15.5 (*)    All other components within normal limits  URINALYSIS, ROUTINE W REFLEX MICROSCOPIC (NOT AT [facility])  VITAMIN B12  FOLATE  IRON AND TIBC  FERRITIN  OCCULT BLOOD X 1 CARD TO LAB, STOOL  LACTATE DEHYDROGENASE  HEPATIC FUNCTION PANEL  POC OCCULT BLOOD, ED  TYPE AND SCREEN  ABO/RH  PREPARE RBC (CROSSMATCH)    Imaging Review No results found. I have personally reviewed and evaluated these images and lab results as part of my medical decision-making.   EKG Interpretation None       9:53 AM I spoke with Dr Maryland Pink ([facility]) who accepts patient for admission.    MDM   Final diagnoses:  Symptomatic anemia    Afebrile  nontoxic patient sent from doctor's office for significant anemia.  Has had progressive fatigue and weakness x months.  Hgb 5.  Chronic hypokalemia.  No active bleeding.  On Plavix. Blood transfusion ordered in ED.  Admitted to [facility].      Clayton Bibles, PA-C 08/21/15 [place], MD 08/22/15 [phone]

## 2015-08-21 NOTE — ED Notes (Signed)
AWARE OF NEED FOR URINE SAMPLE. URINAL AT BS

## 2015-08-22 DIAGNOSIS — Z95810 Presence of automatic (implantable) cardiac defibrillator: Secondary | ICD-10-CM | POA: Diagnosis not present

## 2015-08-22 DIAGNOSIS — D509 Iron deficiency anemia, unspecified: Secondary | ICD-10-CM | POA: Diagnosis not present

## 2015-08-22 DIAGNOSIS — I2510 Atherosclerotic heart disease of native coronary artery without angina pectoris: Secondary | ICD-10-CM

## 2015-08-22 DIAGNOSIS — D649 Anemia, unspecified: Secondary | ICD-10-CM | POA: Diagnosis not present

## 2015-08-22 LAB — CBC
HCT: 30 % — ABNORMAL LOW (ref 39.0–52.0)
HCT: 32 % — ABNORMAL LOW (ref 39.0–52.0)
Hemoglobin: 8.6 g/dL — ABNORMAL LOW (ref 13.0–17.0)
Hemoglobin: 9.2 g/dL — ABNORMAL LOW (ref 13.0–17.0)
MCH: 22.5 pg — ABNORMAL LOW (ref 26.0–34.0)
MCH: 22.5 pg — AB (ref 26.0–34.0)
MCHC: 28.7 g/dL — ABNORMAL LOW (ref 30.0–36.0)
MCHC: 28.8 g/dL — AB (ref 30.0–36.0)
MCV: 78.3 fL (ref 78.0–100.0)
MCV: 78.4 fL (ref 78.0–100.0)
PLATELETS: 213 10*3/uL (ref 150–400)
PLATELETS: 213 10*3/uL (ref 150–400)
RBC: 3.83 MIL/uL — AB (ref 4.22–5.81)
RBC: 4.08 MIL/uL — ABNORMAL LOW (ref 4.22–5.81)
RDW: 22.5 % — ABNORMAL HIGH (ref 11.5–15.5)
RDW: 22.5 % — AB (ref 11.5–15.5)
WBC: 6.7 10*3/uL (ref 4.0–10.5)
WBC: 6.4 10*3/uL (ref 4.0–10.5)

## 2015-08-22 LAB — TYPE AND SCREEN
ABO/RH(D): O POS
ANTIBODY SCREEN: NEGATIVE
UNIT DIVISION: 0
Unit division: 0
Unit division: 0

## 2015-08-22 LAB — COMPREHENSIVE METABOLIC PANEL
ALT: 8 U/L — ABNORMAL LOW (ref 17–63)
AST: 15 U/L (ref 15–41)
Albumin: 3.4 g/dL — ABNORMAL LOW (ref 3.5–5.0)
Alkaline Phosphatase: 65 U/L (ref 38–126)
Anion gap: 9 (ref 5–15)
BUN: 10 mg/dL (ref 6–20)
CHLORIDE: 100 mmol/L — AB (ref 101–111)
CO2: 29 mmol/L (ref 22–32)
CREATININE: 0.69 mg/dL (ref 0.61–1.24)
Calcium: 8.4 mg/dL — ABNORMAL LOW (ref 8.9–10.3)
Glucose, Bld: 127 mg/dL — ABNORMAL HIGH (ref 65–99)
Potassium: 3.6 mmol/L (ref 3.5–5.1)
Sodium: 138 mmol/L (ref 135–145)
TOTAL PROTEIN: 5.9 g/dL — AB (ref 6.5–8.1)
Total Bilirubin: 2 mg/dL — ABNORMAL HIGH (ref 0.3–1.2)

## 2015-08-22 LAB — MAGNESIUM: Magnesium: 1.9 mg/dL (ref 1.7–2.4)

## 2015-08-22 MED ORDER — PANTOPRAZOLE SODIUM 40 MG PO TBEC: 40.0000 mg | DELAYED_RELEASE_TABLET | Freq: Two times a day (BID) | ORAL | Status: DC

## 2015-08-22 MED ORDER — ATORVASTATIN CALCIUM 10 MG PO TABS: 20.0000 mg | ORAL_TABLET | Freq: Every day | ORAL | Status: DC

## 2015-08-22 MED ORDER — POLYSACCHARIDE IRON COMPLEX 150 MG PO CAPS
150.0000 mg | ORAL_CAPSULE | Freq: Two times a day (BID) | ORAL | Status: DC
Start: 2015-08-22 — End: 2017-03-21

## 2015-08-22 MED ORDER — CLOPIDOGREL BISULFATE 75 MG PO TABS: 75.0000 mg | ORAL_TABLET | Freq: Every day | ORAL | Status: DC

## 2015-08-22 NOTE — Evaluation (Signed)
Physical Therapy Evaluation Patient Details Name: Dakota Liu MRN: [id number] DOB: Nov 19, 1927 Today's Date: 08/22/2015   History of Present Illness  82 yo male admitted with symptomatic anemia. Hx of CAD, CABG, CHF, anemia, HTN, PAF, VT, LBBB, PVD, lymphoma  Clinical Impression  On eval, pt was Min assist to stand and intermittent assist to stabilize while ambulating ~125 feet with RW. Pt tolerated distance well. Wife present during session. Both pt and wife both denied HHPT follow up.     Follow Up Recommendations No PT follow up (pt and wife declined HHPT follow up)    Equipment Recommendations  None recommended by PT    Recommendations for Other Services       Precautions / Restrictions Precautions Precautions: Fall Restrictions Weight Bearing Restrictions: No      Mobility  Bed Mobility               General bed mobility comments: oob in recliner  Transfers Overall transfer level: Needs assistance Equipment used: Rolling walker (2 wheeled) Transfers: Sit to/from Stand Sit to Stand: Min assist         General transfer comment: assist to rise, stabilize, contol descent.   Ambulation/Gait Ambulation/Gait assistance: Min assist Ambulation Distance (Feet): 125 Feet Assistive device: Rolling walker (2 wheeled) Gait Pattern/deviations: Trunk flexed;Decreased stride length     General Gait Details: intermittent assist to stabilize. Pt tolerated distance well.   Stairs            Wheelchair Mobility    Modified Rankin (Stroke Patients Only)       Balance Overall balance assessment: Needs assistance         Standing balance support: Bilateral upper extremity supported;During functional activity Standing balance-Leahy Scale: Poor Standing balance comment: requires RW                             Pertinent Vitals/Pain Pain Assessment: No/denies pain    Home Living Family/patient expects to be discharged to:: Private  residence Living Arrangements: Spouse/significant other Available Help at Discharge: Family Type of Home: House Home Access: Ramped entrance     Home Layout: One level Home Equipment: Environmental consultant - 2 wheels;Cane - single point;Wheelchair - manual;Shower seat;Bedside commode      Prior Function Level of Independence: Independent with assistive device(s)         Comments: usnig walker for short distance. wheelchair otherwise     Hand Dominance        Extremity/Trunk Assessment   Upper Extremity Assessment: Generalized weakness           Lower Extremity Assessment: Generalized weakness      Cervical / Trunk Assessment: Kyphotic  Communication   Communication: No difficulties  Cognition Arousal/Alertness: Awake/alert Behavior During Therapy: WFL for tasks assessed/performed Overall Cognitive Status: Within Functional Limits for tasks assessed                      General Comments      Exercises        Assessment/Plan    PT Assessment Patent does not need any further PT services  PT Diagnosis Difficulty walking;Abnormality of gait   PT Problem List    PT Treatment Interventions Gait training;Functional mobility training;Therapeutic activities;Therapeutic exercise;Balance training;Patient/family education   PT Goals (Current goals can be found in the Care Plan section) Acute Rehab PT Goals Patient Stated Goal: home PT Goal Formulation: All assessment and education complete,  DC therapy    Frequency     Barriers to discharge        Co-evaluation               End of Session   Activity Tolerance: Patient tolerated treatment well Patient left: in chair;with call bell/phone within reach;with family/visitor present      Functional Assessment Tool Used: clinical judgement Functional Limitation: Mobility: Walking and moving around Mobility: Walking and Moving Around Current Status [id number]): At least 1 percent but less than 20 percent  impaired, limited or restricted Mobility: Walking and Moving Around Goal Status [phone]): At least 1 percent but less than 20 percent impaired, limited or restricted Mobility: Walking and Moving Around Discharge Status [phone]): At least 1 percent but less than 20 percent impaired, limited or restricted    Time: 1029-1037 PT Time Calculation (min) (ACUTE ONLY): 8 min   Charges:   PT Evaluation $PT Eval Low Complexity: 1 Procedure     PT G Codes:   PT G-Codes **NOT FOR INPATIENT CLASS** Functional Assessment Tool Used: clinical judgement Functional Limitation: Mobility: Walking and moving around Mobility: Walking and Moving Around Current Status [id number]): At least 1 percent but less than 20 percent impaired, limited or restricted Mobility: Walking and Moving Around Goal Status [phone]): At least 1 percent but less than 20 percent impaired, limited or restricted Mobility: Walking and Moving Around Discharge Status [phone]): At least 1 percent but less than 20 percent impaired, limited or restricted    Weston Anna, MPT Pager: [phone]

## 2015-08-22 NOTE — Progress Notes (Signed)
Patient and his wife given discharge, follow up , and medication instructions, verbalized understanding, IVs and telemetry box removed, family to transport home.

## 2015-08-22 NOTE — Progress Notes (Signed)
The order for simvastatin(Zocor) was changed to an equivalent dose of atorvastatin(Lipitor) due to the potential drug interaction with ranolazine.  When taken in combination with medications that inhibit its metabolism, simvastatin can accumulate which increases the risk of liver toxicity, myopathy, or rhabdomyolysis.  Simvastatin dose should not exceed 10mg /day in patients taking verapamil, diltiazem, fibrates, or niacin >or= 1g/day.   Simvastatin dose should not exceed 20mg /day in patients taking amlodipine, ranolazine or amiodarone.   Please consider this potential interaction at discharge.  Dorrene German 08/22/2015 3:15 AM

## 2015-08-22 NOTE — Discharge Summary (Signed)
Physician Discharge Summary  Dakota Liu MRN: [id number] DOB/AGE: [birth year] 82 y.o.  PCP: Woody Seller, MD   Admit date: 08/21/2015 Discharge date: 08/22/2015  Discharge Diagnoses:     Principal Problem:   Symptomatic anemia Active Problems:   Ischemic cardiomyopathy- EF improved to 40-45% 85/02   Chronic systolic congestive heart failure (HCC)   PAF - 12/11- (no Coumadin secondary to unsteady gait)   Biventricular implantable cardioverter-defibrillator in situ   Anemia, iron deficiency   CAD - CABG '88, RCA Stent 12/11, ISR rx'd with HSRA 3/12; patent grafts on cath 10/14   PVD (peripheral vascular disease)- AAA R&G Nov 2010    Follow-up recommendations Follow-up with PCP in 3-5 days , including all  additional recommended appointments as below Follow-up CBC, CMP in 3-5 days Patient to follow-up with hematology oncology for his anemia, previously followed by Dr. Ralene Ok     Medication List    STOP taking these medications        omeprazole 20 MG capsule  Commonly known as:  PRILOSEC  Replaced by:  pantoprazole 40 MG tablet      TAKE these medications        aspirin 81 MG tablet  Take 81 mg by mouth daily.     clopidogrel 75 MG tablet  Commonly known as:  PLAVIX  Take 1 tablet (75 mg total) by mouth daily.  Start taking on:  09/05/2015     furosemide 20 MG tablet  Commonly known as:  LASIX  Take 3 tablets (60 mg total) by mouth daily.     KLOR-CON M20 20 MEQ tablet  Generic drug:  potassium chloride SA  TAKE 1 TABLET BY MOUTH EVERY DAY     nitroGLYCERIN 0.4 MG SL tablet  Commonly known as:  NITROSTAT  Place 0.4 mg under the tongue every 5 (five) minutes as needed.     pantoprazole 40 MG tablet  Commonly known as:  PROTONIX  Take 1 tablet (40 mg total) by mouth 2 (two) times daily.     ranolazine 500 MG 12 hr tablet  Commonly known as:  RANEXA  Take 1 tablet (500 mg total) by mouth 2 (two) times daily.     simvastatin 20 MG tablet   Commonly known as:  ZOCOR  Take 20 mg by mouth daily.         Discharge Condition: Stable   Discharge Instructions       Discharge Instructions    Diet - low sodium heart healthy    Complete by:  As directed      Increase activity slowly    Complete by:  As directed                Disposition: 01-Home or Self Care   Consults: None   Significant Diagnostic Studies:  No results found.     Filed Weights   08/21/15 1202 08/21/15 1715  Weight: 80.287 kg (177 lb) 79.7 kg (175 lb 11.3 oz)     Microbiology: No results found for this or any previous visit (from the past 240 hour(s)).     Blood Culture    Component Value Date/Time   SDES TISSUE TESTICLE, LEFT 06/14/2007 1752   SPECREQUEST ANCEF IMMUNE:NORM 06/14/2007 1752   CULT NO GROWTH 3 DAYS 06/14/2007 1752   REPTSTATUS 06/18/2007 FINAL 06/14/2007 1752      Labs: Results for orders placed or performed during the hospital encounter of 08/21/15 (from the past 48 hour(s))  Hepatic function panel  Status: Abnormal   Collection Time: 08/21/15  8:11 AM  Result Value Ref Range   Total Protein 6.1 (L) 6.5 - 8.1 g/dL   Albumin 3.5 3.5 - 5.0 g/dL   AST 17 15 - 41 U/L   ALT 9 (L) 17 - 63 U/L   Alkaline Phosphatase 63 38 - 126 U/L   Total Bilirubin 0.9 0.3 - 1.2 mg/dL   Bilirubin, Direct <0.1 (L) 0.1 - 0.5 mg/dL   Indirect Bilirubin NOT CALCULATED 0.3 - 0.9 mg/dL  Basic metabolic panel     Status: Abnormal   Collection Time: 08/21/15  8:12 AM  Result Value Ref Range   Sodium 137 135 - 145 mmol/L   Potassium 2.6 (LL) 3.5 - 5.1 mmol/L    Comment: CRITICAL RESULT CALLED TO, READ BACK BY AND VERIFIED WITH: LASSITER,A. RN AT 0930 08/21/15 MULLINS,T    Chloride 95 (L) 101 - 111 mmol/L   CO2 31 22 - 32 mmol/L   Glucose, Bld 156 (H) 65 - 99 mg/dL   BUN 11 6 - 20 mg/dL   Creatinine, Ser 0.90 0.61 - 1.24 mg/dL   Calcium 8.5 (L) 8.9 - 10.3 mg/dL   GFR calc non Af Amer >60 >60 mL/min   GFR calc Af  Amer >60 >60 mL/min    Comment: (NOTE) The eGFR has been calculated using the CKD EPI equation. This calculation has not been validated in all clinical situations. eGFR's persistently <60 mL/min signify possible Chronic Kidney Disease.    Anion gap 11 5 - 15  CBC     Status: Abnormal   Collection Time: 08/21/15  8:12 AM  Result Value Ref Range   WBC 4.9 4.0 - 10.5 K/uL   RBC 2.73 (L) 4.22 - 5.81 MIL/uL   Hemoglobin 5.3 (LL) 13.0 - 17.0 g/dL    Comment: REPEATED TO VERIFY CRITICAL RESULT CALLED TO, READ BACK BY AND VERIFIED WITH: J. HAMILTON RN AT 0840 ON 02.14.17 BY SHUEA    HCT 19.7 (L) 39.0 - 52.0 %   MCV 72.2 (L) 78.0 - 100.0 fL   MCH 19.4 (L) 26.0 - 34.0 pg   MCHC 26.9 (L) 30.0 - 36.0 g/dL   RDW 23.0 (H) 11.5 - 15.5 %   Platelets 203 150 - 400 K/uL  Vitamin B12     Status: None   Collection Time: 08/21/15  8:12 AM  Result Value Ref Range   Vitamin B-12 338 180 - 914 pg/mL    Comment: (NOTE) This assay is not validated for testing neonatal or myeloproliferative syndrome specimens for Vitamin B12 levels. Performed at [facility]   Folate     Status: None   Collection Time: 08/21/15  8:12 AM  Result Value Ref Range   Folate 20.2 >5.9 ng/mL    Comment: Performed at [facility]  Iron and TIBC     Status: Abnormal   Collection Time: 08/21/15  8:12 AM  Result Value Ref Range   Iron 15 (L) 45 - 182 ug/dL   TIBC 466 (H) 250 - 450 ug/dL   Saturation Ratios 3 (L) 17.9 - 39.5 %   UIBC 451 ug/dL    Comment: Performed at [facility]  Ferritin     Status: Abnormal   Collection Time: 08/21/15  8:12 AM  Result Value Ref Range   Ferritin 7 (L) 24 - 336 ng/mL    Comment: Performed at [facility]  Reticulocytes     Status: Abnormal  Collection Time: 08/21/15  8:12 AM  Result Value Ref Range   Retic Ct Pct 2.2 0.4 - 3.1 %   RBC. 2.73 (L) 4.22 - 5.81 MIL/uL   Retic Count, Manual 60.1 19.0 - 186.0 K/uL  Protime-INR     Status: Abnormal    Collection Time: 08/21/15  8:12 AM  Result Value Ref Range   Prothrombin Time 15.5 (H) 11.6 - 15.2 seconds   INR 1.21 0.00 - 1.49  Type and screen [place]     Status: None   Collection Time: 08/21/15  8:12 AM  Result Value Ref Range   ABO/RH(D) O POS    Antibody Screen NEG    Sample Expiration 08/24/2015    Unit Number [id number]    Blood Component Type RBC LR PHER2    Unit division 00    Status of Unit ISSUED,FINAL    Transfusion Status OK TO TRANSFUSE    Crossmatch Result Compatible    Unit Number [id number]    Blood Component Type RBC LR PHER2    Unit division 00    Status of Unit ISSUED,FINAL    Transfusion Status OK TO TRANSFUSE    Crossmatch Result Compatible    Unit Number [id number]    Blood Component Type RED CELLS,LR    Unit division 00    Status of Unit ISSUED,FINAL    Transfusion Status OK TO TRANSFUSE    Crossmatch Result Compatible   ABO/Rh     Status: None   Collection Time: 08/21/15  8:12 AM  Result Value Ref Range   ABO/RH(D) O POS   POC occult blood, ED     Status: None   Collection Time: 08/21/15  9:41 AM  Result Value Ref Range   Fecal Occult Bld NEGATIVE NEGATIVE  Prepare RBC     Status: None   Collection Time: 08/21/15  9:47 AM  Result Value Ref Range   Order Confirmation ORDER PROCESSED BY BLOOD BANK   Lactate dehydrogenase     Status: None   Collection Time: 08/21/15 10:30 AM  Result Value Ref Range   LDH 157 98 - 192 U/L  Urinalysis, Routine w reflex microscopic (not at [facility])     Status: Abnormal   Collection Time: 08/21/15  4:59 PM  Result Value Ref Range   Color, Urine AMBER (A) YELLOW    Comment: BIOCHEMICALS MAY BE AFFECTED BY COLOR   APPearance CLEAR CLEAR   Specific Gravity, Urine 1.024 1.005 - 1.030   pH 7.5 5.0 - 8.0   Glucose, UA NEGATIVE NEGATIVE mg/dL   Hgb urine dipstick NEGATIVE NEGATIVE   Bilirubin Urine SMALL (A) NEGATIVE   Ketones, ur NEGATIVE NEGATIVE mg/dL   Protein, ur NEGATIVE NEGATIVE  mg/dL   Nitrite NEGATIVE NEGATIVE   Leukocytes, UA NEGATIVE NEGATIVE    Comment: MICROSCOPIC NOT DONE ON URINES WITH NEGATIVE PROTEIN, BLOOD, LEUKOCYTES, NITRITE, OR GLUCOSE <1000 mg/dL.  Prepare RBC     Status: None   Collection Time: 08/21/15  5:36 PM  Result Value Ref Range   Order Confirmation ORDER PROCESSED BY BLOOD BANK   Magnesium     Status: None   Collection Time: 08/21/15  5:36 PM  Result Value Ref Range   Magnesium 1.9 1.7 - 2.4 mg/dL  Basic metabolic panel     Status: Abnormal   Collection Time: 08/21/15  8:00 PM  Result Value Ref Range   Sodium 138 135 - 145 mmol/L   Potassium 3.5 3.5 - 5.1 mmol/L  Comment: DELTA CHECK NOTED NO VISIBLE HEMOLYSIS REPEATED TO VERIFY    Chloride 99 (L) 101 - 111 mmol/L   CO2 30 22 - 32 mmol/L   Glucose, Bld 162 (H) 65 - 99 mg/dL   BUN 10 6 - 20 mg/dL   Creatinine, Ser 0.71 0.61 - 1.24 mg/dL   Calcium 8.6 (L) 8.9 - 10.3 mg/dL   GFR calc non Af Amer >60 >60 mL/min   GFR calc Af Amer >60 >60 mL/min    Comment: (NOTE) The eGFR has been calculated using the CKD EPI equation. This calculation has not been validated in all clinical situations. eGFR's persistently <60 mL/min signify possible Chronic Kidney Disease.    Anion gap 9 5 - 15  Comprehensive metabolic panel     Status: Abnormal   Collection Time: 08/22/15  4:55 AM  Result Value Ref Range   Sodium 138 135 - 145 mmol/L   Potassium 3.6 3.5 - 5.1 mmol/L   Chloride 100 (L) 101 - 111 mmol/L   CO2 29 22 - 32 mmol/L   Glucose, Bld 127 (H) 65 - 99 mg/dL   BUN 10 6 - 20 mg/dL   Creatinine, Ser 0.69 0.61 - 1.24 mg/dL   Calcium 8.4 (L) 8.9 - 10.3 mg/dL   Total Protein 5.9 (L) 6.5 - 8.1 g/dL   Albumin 3.4 (L) 3.5 - 5.0 g/dL   AST 15 15 - 41 U/L   ALT 8 (L) 17 - 63 U/L   Alkaline Phosphatase 65 38 - 126 U/L   Total Bilirubin 2.0 (H) 0.3 - 1.2 mg/dL   GFR calc non Af Amer >60 >60 mL/min   GFR calc Af Amer >60 >60 mL/min    Comment: (NOTE) The eGFR has been calculated using the  CKD EPI equation. This calculation has not been validated in all clinical situations. eGFR's persistently <60 mL/min signify possible Chronic Kidney Disease.    Anion gap 9 5 - 15  CBC     Status: Abnormal   Collection Time: 08/22/15  4:55 AM  Result Value Ref Range   WBC 6.7 4.0 - 10.5 K/uL   RBC 3.83 (L) 4.22 - 5.81 MIL/uL   Hemoglobin 8.6 (L) 13.0 - 17.0 g/dL    Comment: DELTA CHECK NOTED REPEATED TO VERIFY POST TRANSFUSION SPECIMEN    HCT 30.0 (L) 39.0 - 52.0 %   MCV 78.3 78.0 - 100.0 fL    Comment: DELTA CHECK NOTED REPEATED TO VERIFY POST TRANSFUSION SPECIMEN    MCH 22.5 (L) 26.0 - 34.0 pg   MCHC 28.7 (L) 30.0 - 36.0 g/dL   RDW 22.5 (H) 11.5 - 15.5 %   Platelets 213 150 - 400 K/uL  Magnesium     Status: None   Collection Time: 08/22/15  4:55 AM  Result Value Ref Range   Magnesium 1.9 1.7 - 2.4 mg/dL  CBC     Status: Abnormal   Collection Time: 08/22/15  8:54 AM  Result Value Ref Range   WBC 6.4 4.0 - 10.5 K/uL   RBC 4.08 (L) 4.22 - 5.81 MIL/uL   Hemoglobin 9.2 (L) 13.0 - 17.0 g/dL   HCT 32.0 (L) 39.0 - 52.0 %   MCV 78.4 78.0 - 100.0 fL   MCH 22.5 (L) 26.0 - 34.0 pg   MCHC 28.8 (L) 30.0 - 36.0 g/dL   RDW 22.5 (H) 11.5 - 15.5 %   Platelets 213 150 - 400 K/uL    Comment: SPECIMEN CHECKED FOR CLOTS REPEATED  TO VERIFY      Lipid Panel     Component Value Date/Time   CHOL  07/02/2010 0825    164        ATP III CLASSIFICATION:  <200     mg/dL   Desirable  200-239  mg/dL   Borderline High  >=240    mg/dL   High          TRIG 115 07/02/2010 0825   HDL 28* 07/02/2010 0825   CHOLHDL 5.9 07/02/2010 0825   VLDL 23 07/02/2010 0825   LDLCALC * 07/02/2010 0825    113        Total Cholesterol/HDL:CHD Risk Coronary Heart Disease Risk Table                     Men   Women  1/2 Average Risk   3.4   3.3  Average Risk       5.0   4.4  2 X Average Risk   9.6   7.1  3 X Average Risk  23.4   11.0        Use the calculated Patient Ratio above and the CHD Risk  Table to determine the patient's CHD Risk.        ATP III CLASSIFICATION (LDL):  <100     mg/dL   Optimal  100-129  mg/dL   Near or Above                    Optimal  130-159  mg/dL   Borderline  160-189  mg/dL   High  >190     mg/dL   Very High     Lab Results  Component Value Date   HGBA1C  05/20/2009    5.9 (NOTE) The ADA recommends the following therapeutic goal for glycemic control related to Hgb A1c measurement: Goal of therapy: <6.5 Hgb A1c  Reference: American Diabetes Association: Clinical Practice Recommendations 2010, Diabetes Care, 2010, 33: (Suppl  1).     Lab Results  Component Value Date   LDLCALC * 07/02/2010    113        Total Cholesterol/HDL:CHD Risk Coronary Heart Disease Risk Table                     Men   Women  1/2 Average Risk   3.4   3.3  Average Risk       5.0   4.4  2 X Average Risk   9.6   7.1  3 X Average Risk  23.4   11.0        Use the calculated Patient Ratio above and the CHD Risk Table to determine the patient's CHD Risk.        ATP III CLASSIFICATION (LDL):  <100     mg/dL   Optimal  100-129  mg/dL   Near or Above                    Optimal  130-159  mg/dL   Borderline  160-189  mg/dL   High  >190     mg/dL   Very High   CREATININE 0.69 08/22/2015     HPI :82 y.o. male with a past medical history of coronary artery disease status post CABG and stent placement, history of chronic systolic congestive heart failure, defibrillator in situ, iron deficiency anemia, who stopped taking his iron tablets about a year ago. Patient presented to his  primary care physician's office with complaints of weakness, shortness of breath with exertion and fatigue ongoing for 2 months. Hemoglobin was checked and was found to be low. Patient was referred to the emergency department. He denies any nausea, vomiting, diarrhea. No chest pain currently. Denies any blood in the stool or black stool. Denies any blood in the urine. Denies any other bleeding  anywhere. He last had a colonoscopy about 10 years ago, which did not reveal any abnormalities. He admits to only occasional acid reflux, not on a consistent basis. He did tell me that he stopped taking his iron about a year ago. He used to be followed by Dr. Ralene Ok.  HOSPITAL COURSE:    #1 Symptomatic anemia: Presented with hemoglobin of 5.3> now 9.2, Status post transfusion of 3 units of PRBCs. Normal LDH.   The reason for his anemia is most likely noncompliance with his iron tablets. Stool for occult blood is negative. No evidence for overt bleeding. Does not need inpatient GI workup. Patient eager to go home and would like to reestablish with hematology for his anemia.  #2 History of CAD status post CABG and stent placement to the RCA in 2011. Patient is followed closely by cardiology. He is on aspirin and Plavix. I have advised him to hold his Plavix for 2 weeks.    #3 chronic systolic congestive heart failure: Without exacerbation. He is appears to be well compensated.   Echocardiogram from 2014 showed EF of 40-45%.  #4 Severe hypokalemia: He is on Lasix at home, which could be the reason for his low potassium. Potassium will be aggressively repleted. Magnesium 1.9   #5 history of paroxysmal atrial fibrillation: Rate is reasonably well controlled. Patient is not noted to be on any rate limiting drugs. Cardiology notes were reviewed from last year. Apparently, patient has taken himself off of beta blocker as well as the ARB as it was causing too much dizziness. Telemetry uneventful. Patient has a biventricular implantable cardioverter defibrillator in situ.  #6 Iron deficiency anemia: Patient has been told that he needs to be compliant with his iron tablets. He tells me that he gets extremely constipated. Stool softeners can be prescribed with iron tablets. The other option is to consider iron infusions. These can be pursued as an outpatient. Patient has to hold Plavix for 2 weeks and continue  with aspirin. Last cardiac cath was in 2014.   Discharge Exam:  Blood pressure 123/68, pulse 83, temperature 97.5 F (36.4 C), temperature source Oral, resp. rate 20, height _0  (1.905 m), weight 79.7 kg (175 lb 11.3 oz), SpO2 93 %.   Cardio: regular rate and rhythm, S1, S2 normal, no murmur, click, rub or gallop GI: soft, non-tender; bowel sounds normal; no masses, no organomegaly Extremities: extremities normal, atraumatic, no cyanosis or edema Pulses: 2+ and symmetric Skin: Extremely pale Lymph nodes: Cervical, supraclavicular, and axillary nodes normal. Neurologic: Awake and alert. Oriented 3. No focal neurological deficits.   Follow-up Information    Follow up with Woody Seller, MD. Schedule an appointment as soon as possible for a visit in 3 days.   Specialty:  Family Medicine   Contact information:   [address] [place] [address] [phone]       Follow up with cancer treatment . Schedule an appointment as soon as possible for a visit in 3 days.   Why:  for severe anemia   Contact information:   [facility] 3.9 7 Google reviews  [facility] Address: [address] [place], [place] [address] [phone]      Signed: Reyne Dumas 08/22/2015, 9:46 AM        Time spent >45 mins

## 2015-08-22 NOTE — Progress Notes (Signed)
OT Cancellation Note  Patient Details Name: JAQUI OFTEDAHL MRN: [id number] DOB: 22-Jul-1927   Cancelled Treatment:    Reason Eval/Treat Not Completed: Other (comment) -- patient sitting in recliner fully dressed, packed up, states he's about to discharge home. Denies OT needs. OT will sign off.  Tanea Moga A 08/22/2015, 10:56 AM

## 2015-09-04 ENCOUNTER — Encounter: Payer: Self-pay | Admitting: Cardiovascular Disease

## 2015-09-04 ENCOUNTER — Ambulatory Visit (INDEPENDENT_AMBULATORY_CARE_PROVIDER_SITE_OTHER): Payer: Medicare Other | Admitting: Cardiovascular Disease

## 2015-09-04 VITALS — BP 128/70 | HR 65 | Ht 75.0 in | Wt 175.0 lb

## 2015-09-04 DIAGNOSIS — Z95810 Presence of automatic (implantable) cardiac defibrillator: Secondary | ICD-10-CM

## 2015-09-04 DIAGNOSIS — I2510 Atherosclerotic heart disease of native coronary artery without angina pectoris: Secondary | ICD-10-CM | POA: Diagnosis not present

## 2015-09-04 DIAGNOSIS — I5022 Chronic systolic (congestive) heart failure: Secondary | ICD-10-CM | POA: Diagnosis not present

## 2015-09-04 DIAGNOSIS — I472 Ventricular tachycardia, unspecified: Secondary | ICD-10-CM

## 2015-09-04 DIAGNOSIS — I255 Ischemic cardiomyopathy: Secondary | ICD-10-CM | POA: Diagnosis not present

## 2015-09-04 DIAGNOSIS — I480 Paroxysmal atrial fibrillation: Secondary | ICD-10-CM

## 2015-09-04 DIAGNOSIS — D509 Iron deficiency anemia, unspecified: Secondary | ICD-10-CM

## 2015-09-04 DIAGNOSIS — E785 Hyperlipidemia, unspecified: Secondary | ICD-10-CM

## 2015-09-04 NOTE — Progress Notes (Signed)
Patient ID: Dakota Liu, male   DOB: 21-Aug-1927, 82 y.o.   MRN: [id number]    Cardiology Office Note    Date:  09/04/2015   ID:  Dakota Liu, DOB 09-Jul-1927, MRN [id number]  PCP:  Dakota Seller, MD  Cardiologist:   Dakota Klein, MD   Chief Complaint  Patient presents with  . Annual Exam    PACER CHECK  . Edema    LEGS  . Shortness of Breath    History of Present Illness:  Dakota Liu is a 82 y.o. 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  He has been doing quite well in the year that has passed since his last appointment. He has not had any episodes of heart failure exacerbation. No change has been made to his diuretic prescription.  His thoracic impedance (Optivol) levels have been steady.  He had severe anemia (Hgb 5.3, Feb 2016) and required transfusion. He is taking an iron supplement. Just a few days ago his hemoglobin was still low at 9.2 with microcytic indices. No overt bleeding site has been identified.   Vice interrogation today shows battery voltage is very close to elective replacement (2.62 V) but not quite there yet. He has not had any ventricular tachycardia. A 6 minute episode of atrial flutter occurred in April 2016, no meaningful events since. He has 4% atrial pacing and 98% biventricular pacing. Left ventricular lead capture threshold is fairly high, but remains acceptable and he does not have any diaphragmatic/intercostal muscle stimulation.  He had CABG in '88 with subsequent PCI 12/11 (distal RCA stent), 3/12 ([place] angioplasty for in-stent restenosis). Last cardiac cath in October 2014 showed severe native vessel disease (70% left main, 95% ostial LAD occluded proximally, all OM branch is occluded,  right coronary artery occluded proximally) but with patent grafts (LIMA to the LAD, SVG to diagonal, sequential SVG to OM1 and-1 to-13, SVG to RCA) and patent site of previous stent in the distal RCA.  He had moderate to severe ischemic cardiopathy with estimated LVEF of 30% and akinesis of the mid to distal anterior wall and mid to distal inferior wall and apical dyskinesis. He received a CRT-D Medtronic device in December 2011 with good response and an increase in LVEF to 40-45%.  He has treated hypertension and hyperlipidemia and a previous history of lymphoma and iron deficiency anemia. He wears braces on his ankles for unsteady joints.  His pacemaker has recorded occasional atrial fibrillation but he is not receiving warfarin secondary to unsteady gait and falls. No history of stroke/TIA or other embolic events.  Past Medical History  Diagnosis Date  . HTN (hypertension)   . Dyslipidemia   . Lymphoma ([place])     with metal radiation about 2006  . Epididymitis   . PAF (paroxysmal atrial fibrillation) ([place]) 12/11  . Anemia, iron deficiency 11/21/2011  . CAD (coronary artery disease) '88, 12/11, 3/12    CABG'88, PCI 12/1, 3/12  . Ischemic cardiomyopathy 10/14    EF improved to 40-45% after BiV ICD  . CHF (congestive heart failure) ([place])   . VT (ventricular tachycardia) ([place]) 12/11  . PVD (peripheral vascular disease) ([place]) 11/10    AAA R&G  . LBBB (left bundle branch block)     Past Surgical History  Procedure Laterality Date  . Tonsillectomy    .  Orchiectomy    . Coronary artery bypass graft  1988  . Abdominal aortic aneurysm repair  11/10  . Cardiac defibrillator placement  12/11  . Coronary angioplasty with stent placement  07/05/10, 3/12    RCA with ISR 3/12  . Left heart catheterization with coronary/graft angiogram N/A 05/03/2013    Procedure: LEFT HEART CATHETERIZATION WITH Beatrix Fetters;  Surgeon: Dakota M Martinique, MD;  Location: [facility] CATH LAB;  Service:  Cardiovascular;  Laterality: N/A;    Outpatient Prescriptions Prior to Visit  Medication Sig Dispense Refill  . aspirin 81 MG tablet Take 81 mg by mouth daily.    Derrill Memo ON 09/05/2015] clopidogrel (PLAVIX) 75 MG tablet Take 1 tablet (75 mg total) by mouth daily. 30 tablet 0  . furosemide (LASIX) 20 MG tablet Take 3 tablets (60 mg total) by mouth daily. 90 tablet 0  . iron polysaccharides (NU-IRON) 150 MG capsule Take 1 capsule (150 mg total) by mouth 2 (two) times daily. 60 capsule 0  . KLOR-CON M20 20 MEQ tablet TAKE 1 TABLET BY MOUTH EVERY DAY 90 tablet 2  . nitroGLYCERIN (NITROSTAT) 0.4 MG SL tablet Place 0.4 mg under the tongue every 5 (five) minutes as needed.      . pantoprazole (PROTONIX) 40 MG tablet Take 1 tablet (40 mg total) by mouth 2 (two) times daily. 60 tablet 0  . ranolazine (RANEXA) 500 MG 12 hr tablet Take 1 tablet (500 mg total) by mouth 2 (two) times daily. 60 tablet 0  . simvastatin (ZOCOR) 40 MG tablet Take 40 mg by mouth daily.     No facility-administered medications prior to visit.     Allergies:   Tramadol; Ace inhibitors; Codeine; and Simvastatin   Social History   Social History  . Marital Status: Married    Spouse Name: N/A  . Number of Children: N/A  . Years of Education: N/A   Social History Main Topics  . Smoking status: Former Smoker -- 1.00 packs/day for 44 years    Types: Cigarettes    Quit date: 07/07/1984  . Smokeless tobacco: Never Used  . Alcohol Use: No  . Drug Use: No  . Sexual Activity: Not Asked   Other Topics Concern  . None   Social History Narrative     Family History:  The patient's family history includes Cancer in his sister; Diabetes in his mother; Heart Problems in his mother; Stroke in his father.   ROS:   Please see the history of present illness.    ROS All other systems reviewed and are negative.   PHYSICAL EXAM:   VS:  BP 128/70 mmHg  Pulse 65  Ht 6\' 3"  (1.905 m)  Wt 79.379 kg (175 lb)  BMI 21.87 kg/m2     GEN: Well nourished, well developed, in no acute distress HEENT: normal Neck: no JVD, carotid bruits, or masses Cardiac: RRR; no murmurs, rubs, or gallops,no edema , healthy left subclavian defibrillator site Respiratory:  clear to auscultation bilaterally, normal work of breathing GI: soft, nontender, nondistended, + BS MS: no deformity or atrophy Skin: warm and dry, no rash Neuro:  Alert and Oriented x 3, Strength and sensation are intact Psych: euthymic mood, full affect  Wt Readings from Last 3 Encounters:  09/04/15 79.379 kg (175 lb)  08/21/15 79.7 kg (175 lb 11.3 oz)  08/22/14 80.287 kg (177 lb)      Studies/Labs Reviewed:   EKG:  EKG is ordered today.  The ekg ordered today shows  A sensed - V paced rhythm  Recent Labs: 08/22/2015: ALT 8*; BUN 10; Creatinine, Ser 0.69; Hemoglobin 9.2*; Magnesium 1.9; Platelets 213; Potassium 3.6; Sodium 138   Lipid Panel LDL cholesterol 51, total cholesterol 103, triglycerides 97, HDL 36 Aug 20, 2015   ASSESSMENT:    1. Biventricular implantable cardioverter-defibrillator in situ   2. Chronic systolic congestive heart failure ([place])   3. Ischemic cardiomyopathy- EF improved to 40-45% 10/14   4. Coronary artery disease involving native coronary artery of native heart without angina pectoris   5. VT (ventricular tachycardia) - 12/11   6. PAF - 12/11- (no Coumadin secondary to unsteady gait)   7. Dyslipidemia   8. Anemia, iron deficiency      PLAN:  In order of problems listed above:  1. CRT-D: His device is functioning normally but is very close to elective replacement. I asked Dakota Liu and his wife to consider the option of down grade to a CRT-P device. He has not needed/received appropriate shocks from his defibrillator. Left ventricular systolic function has improved. 2. CHF: most recent EF 40-45%, well compensated symptoms, clinically euvolemic on current medical regimen. Normal thoracic impedance by device check. He was  treated with beta blocker and angiotensin receptor blocker in the past these had to be gradually weaned and eventually stopped due to symptomatic hypotension   3. CMP, ischemic   4. CAD s/p CABG and PCI: asymptomatic 5. VT:  None recorded in several years  6. AFib: Episodes remain very brief and asymptomatic. The overall prevalence of arrhythmia is extremely low. Warfarin was being avoided before due to unsteady gait and falls. He now also has severe iron deficiency anemia and I think anticoagulation would be out of the question.  7. HLP: Labs from February 13 show LDL cholesterol 51, total cholesterol 103, triglycerides 97, HDL 36 8. Anemia : I think to help reduce the effects of iron deficiency, will have him stop taking aspirin and continue clopidogrel only     Medication Adjustments/Labs and Tests Ordered: Current medicines are reviewed at length with the patient today.  Concerns regarding medicines are outlined above.  Medication changes, Labs and Tests ordered today are listed in the Patient Instructions below. Patient Instructions  CONTINUE PLAVIX  STOP ASPIRIN  Remote monitoring is used to monitor your Pacemaker or ICD from home. This monitoring reduces the number of office visits required to check your device to one time per year. It allows [place] to monitor the functioning of your device to ensure it is working properly. You are scheduled for a device check from home on Dec 03, 2015. You may send your transmission at any time that day. If you have a wireless device, the transmission will be sent automatically. After your physician reviews your transmission, you will receive a postcard with your next transmission date.  Dr. Sallyanne Kuster recommends that you schedule a follow-up appointment in: [place] ([facility]).        Mikael Spray, MD  09/04/2015 2:48 PM    [address], [place], [place]  [address] Phone:  [phone]; Fax: [phone]

## 2015-09-04 NOTE — Patient Instructions (Signed)
CONTINUE PLAVIX  STOP ASPIRIN  Remote monitoring is used to monitor your Pacemaker or ICD from home. This monitoring reduces the number of office visits required to check your device to one time per year. It allows [place] to monitor the functioning of your device to ensure it is working properly. You are scheduled for a device check from home on Dec 03, 2015. You may send your transmission at any time that day. If you have a wireless device, the transmission will be sent automatically. After your physician reviews your transmission, you will receive a postcard with your next transmission date.  Dr. Sallyanne Kuster recommends that you schedule a follow-up appointment in: [place] ([facility]).

## 2015-09-04 NOTE — Addendum Note (Signed)
Addended by: Janett Labella A on: 09/04/2015 04:54 PM   Modules accepted: Orders

## 2015-09-10 ENCOUNTER — Telehealth: Payer: Self-pay

## 2015-09-10 NOTE — Telephone Encounter (Signed)
Received voice mail message from patients wife stating patient has noise from his device.  Call to wife and she stated for the last 3 days at the same time, there is a musical tone coming from patient's device.  She was told at visit with Dr Sallyanne Kuster that the battery is close to replacement.   Advised to send remote transmission and per device tech, Memory [employer] the sound from the device is usually due to battery low.  Wife unable to send transmission and advised her to call Med[employer] support tech to assist with transmission.  Will review once transmission is sent.

## 2015-09-10 NOTE — Telephone Encounter (Signed)
Received voice mail message from wife stating she called tech support and a new monitor will be sent.  Unable to send remote transmission.    Call to wife and explained that patient needs to come to device clinic to have battery check.  She agreed to appointment on 09/14/2015 at 1:30pm.

## 2015-09-14 ENCOUNTER — Ambulatory Visit (INDEPENDENT_AMBULATORY_CARE_PROVIDER_SITE_OTHER): Payer: Medicare Other | Admitting: *Deleted

## 2015-09-14 DIAGNOSIS — Z95810 Presence of automatic (implantable) cardiac defibrillator: Secondary | ICD-10-CM

## 2015-09-14 LAB — CUP PACEART INCLINIC DEVICE CHECK
Battery Voltage: 2.62 V
Brady Statistic AP VP Percent: 25.77 %
Brady Statistic RA Percent Paced: 25.81 %
Brady Statistic RV Percent Paced: 99.64 %
HighPow Impedance: 304 Ohm
HighPow Impedance: 44 Ohm
HighPow Impedance: 61 Ohm
Implantable Lead Implant Date: 20111230
Implantable Lead Implant Date: 20111230
Implantable Lead Location: 753858
Implantable Lead Location: 753860
Implantable Lead Model: 4196
Implantable Lead Model: 6947
Lead Channel Impedance Value: 437 Ohm
Lead Channel Pacing Threshold Pulse Width: 0.4 ms
Lead Channel Sensing Intrinsic Amplitude: 2 mV
Lead Channel Sensing Intrinsic Amplitude: 2 mV
MDC IDC LEAD IMPLANT DT: 20111230
MDC IDC LEAD LOCATION: 753859
MDC IDC MSMT LEADCHNL LV IMPEDANCE VALUE: 1026 Ohm
MDC IDC MSMT LEADCHNL LV IMPEDANCE VALUE: 1615 Ohm
MDC IDC MSMT LEADCHNL LV IMPEDANCE VALUE: 817 Ohm
MDC IDC MSMT LEADCHNL LV PACING THRESHOLD AMPLITUDE: 4 V
MDC IDC MSMT LEADCHNL LV PACING THRESHOLD PULSEWIDTH: 1.5 ms
MDC IDC MSMT LEADCHNL RA PACING THRESHOLD AMPLITUDE: 0.75 V
MDC IDC MSMT LEADCHNL RA PACING THRESHOLD PULSEWIDTH: 0.4 ms
MDC IDC MSMT LEADCHNL RV IMPEDANCE VALUE: 399 Ohm
MDC IDC MSMT LEADCHNL RV PACING THRESHOLD AMPLITUDE: 1.25 V
MDC IDC MSMT LEADCHNL RV SENSING INTR AMPL: 18 mV
MDC IDC MSMT LEADCHNL RV SENSING INTR AMPL: 18 mV
MDC IDC SESS DTM: 20170310135519
MDC IDC SET LEADCHNL LV PACING AMPLITUDE: 4.25 V
MDC IDC SET LEADCHNL LV PACING PULSEWIDTH: 1.5 ms
MDC IDC SET LEADCHNL RA PACING AMPLITUDE: 2 V
MDC IDC SET LEADCHNL RV PACING AMPLITUDE: 2.5 V
MDC IDC SET LEADCHNL RV PACING PULSEWIDTH: 0.4 ms
MDC IDC SET LEADCHNL RV SENSING SENSITIVITY: 0.3 mV
MDC IDC STAT BRADY AP VS PERCENT: 0.04 %
MDC IDC STAT BRADY AS VP PERCENT: 73.88 %
MDC IDC STAT BRADY AS VS PERCENT: 0.31 %

## 2015-09-14 NOTE — Progress Notes (Signed)
CRTD check in clinic for alert tones heard. RRT on 09/08/15. Alert tones turned off. Will route chart to Satira Sark and Dr. Sallyanne Kuster for scheduling generator replacement/ possible downgrade to CRTP.  ICM Clinic will continue to monitor.

## 2015-09-18 ENCOUNTER — Telehealth: Payer: Self-pay | Admitting: *Deleted

## 2015-09-18 ENCOUNTER — Encounter: Payer: Self-pay | Admitting: Cardiovascular Disease

## 2015-09-18 NOTE — Telephone Encounter (Signed)
Pt's wife calling in due to alert tone from ICD. Remote transmission requested- she is agreeable.  Transmission received and reviewed- alert due to unsuccessful alert transmission due to patient having monitor disconnected at the time of RRT alert. Even though RRT pt alert was turned off last week, this alert will have to be turned off in the office as well (clarified with [employer] support). Patient's wife elects to not schedule an appt at this time- she knows what the alert is for and is ok with leaving it on until generator replacement. I advised her to call back if the patient changes his mind.

## 2015-09-18 NOTE — Telephone Encounter (Signed)
Spoke with patient regarding procedure----generator change--ordered by Dr. Sallyanne Kuster.  Scheduled for 10/02/15 at 5:30 pm---arrive at short stay @ 3:30 pm   NPO after midnight----have pre procedure labs Wednesday 09/26/15.  I informed patient I will mail instruction letter to him.  He voiced his understanding.

## 2015-09-24 ENCOUNTER — Telehealth: Payer: Self-pay | Admitting: Cardiovascular Disease

## 2015-09-24 NOTE — Telephone Encounter (Signed)
If this cath is that late, he can have breakfast, but no lunch. Would like his stomach empty for 6-8 hours before the procedure.

## 2015-09-24 NOTE — Telephone Encounter (Signed)
Returned call and communicated advice to caller.  She voiced understanding of physician's instructions as detailed.

## 2015-09-24 NOTE — Telephone Encounter (Signed)
New Message  Pt wife called to discuss CATH. She states that the CATH isn't until 5:30p and he has to fast until that time. Pt request a call back to determine if he truly has to wait that long to eat. Please call back to discuss.

## 2015-09-26 ENCOUNTER — Telehealth: Payer: Self-pay | Admitting: *Deleted

## 2015-09-26 ENCOUNTER — Encounter: Payer: Self-pay | Admitting: Cardiovascular Disease

## 2015-09-26 ENCOUNTER — Other Ambulatory Visit: Payer: Self-pay | Admitting: *Deleted

## 2015-09-26 DIAGNOSIS — Z01810 Encounter for preprocedural cardiovascular examination: Secondary | ICD-10-CM

## 2015-09-26 DIAGNOSIS — I255 Ischemic cardiomyopathy: Secondary | ICD-10-CM

## 2015-09-26 NOTE — Telephone Encounter (Signed)
Called and spoke with patient regarding rescheduling his generator change from 10/02/15 to 10/09/15.  Patient states he has decided on the pacemaker.  Called patient with new date and time  Tuesday 10/09/15 @ 2:00 pm.  Arrive at short stay center at 12:00 noon---NPO after midnight.  He has already has his pre procedure labs done and I sent Chelley Truitt a staff message to order a chest x-ray.  I told patient I will mail a new instruction letter to him.  He voiced his understanding.

## 2015-09-26 NOTE — Telephone Encounter (Signed)
No lab orders for cath - patient had come to [facility] to have drawn.  Orders placed. Also instructed for CXR.

## 2015-09-27 LAB — BASIC METABOLIC PANEL
BUN: 18 mg/dL (ref 7–25)
CO2: 30 mmol/L (ref 20–31)
CREATININE: 1.01 mg/dL (ref 0.70–1.11)
Calcium: 9 mg/dL (ref 8.6–10.3)
Chloride: 100 mmol/L (ref 98–110)
GLUCOSE: 136 mg/dL — AB (ref 65–99)
POTASSIUM: 3.9 mmol/L (ref 3.5–5.3)
Sodium: 137 mmol/L (ref 135–146)

## 2015-09-27 LAB — CBC
HEMATOCRIT: 33.2 % — AB (ref 39.0–52.0)
Hemoglobin: 9.9 g/dL — ABNORMAL LOW (ref 13.0–17.0)
MCH: 23.5 pg — AB (ref 26.0–34.0)
MCHC: 29.8 g/dL — ABNORMAL LOW (ref 30.0–36.0)
MCV: 78.9 fL (ref 78.0–100.0)
PLATELETS: 196 10*3/uL (ref 150–400)
RBC: 4.21 MIL/uL — ABNORMAL LOW (ref 4.22–5.81)
RDW: 24.5 % — AB (ref 11.5–15.5)
WBC: 5.8 10*3/uL (ref 4.0–10.5)

## 2015-09-27 LAB — APTT: APTT: 32 s (ref 24–37)

## 2015-09-27 LAB — TSH: TSH: 2.18 mIU/L (ref 0.40–4.50)

## 2015-09-27 LAB — PROTIME-INR
INR: 1.06 (ref <1.50)
PROTHROMBIN TIME: 13.9 s (ref 11.6–15.2)

## 2015-10-01 ENCOUNTER — Telehealth: Payer: Self-pay | Admitting: Cardiovascular Disease

## 2015-10-01 NOTE — Telephone Encounter (Signed)
No , chest x ray is not necessary

## 2015-10-01 NOTE — Telephone Encounter (Signed)
Returned call to patient's wife.Dr.Croitoru advised no cxr needed.

## 2015-10-01 NOTE — Telephone Encounter (Signed)
Returned call to patient's wife.She wanted to ask Dr.Croitoru if husband needs CXR before he has generator change.Message sent to Dr.Croitoru for advice.

## 2015-10-01 NOTE — Telephone Encounter (Signed)
Does he need a chest xray order? He is suppose to have one before his generator change.

## 2015-10-03 ENCOUNTER — Other Ambulatory Visit: Payer: Self-pay

## 2015-10-05 ENCOUNTER — Ambulatory Visit (INDEPENDENT_AMBULATORY_CARE_PROVIDER_SITE_OTHER): Payer: Medicare Other

## 2015-10-05 DIAGNOSIS — Z95810 Presence of automatic (implantable) cardiac defibrillator: Secondary | ICD-10-CM | POA: Diagnosis not present

## 2015-10-05 DIAGNOSIS — I5022 Chronic systolic (congestive) heart failure: Secondary | ICD-10-CM | POA: Diagnosis not present

## 2015-10-05 NOTE — Progress Notes (Signed)
EPIC Encounter for ICM Monitoring  Patient Name: Dakota Liu is a 82 y.o. male Date: 10/05/2015 Primary Care Physican: Woody Seller, MD Primary Cardiologist: Croitoru Electrophysiologist: Croitoru Dry Weight: Does not weigh   Bi-V Pacing 96.6%      In the past month, have you:  1. Gained more than 2 pounds in a day or more than 5 pounds in a week? no  2. Had changes in your medications (with verification of current medications)? no  3. Had more shortness of breath than is usual for you? no  4. Limited your activity because of shortness of breath? no  5. Not been able to sleep because of shortness of breath? no  6. Had increased swelling in your feet or ankles? no  7. Had symptoms of dehydration (dizziness, dry mouth, increased thirst, decreased urine output) no  8. Had changes in sodium restriction? no  9. Been compliant with medication? Yes   ICM trend: 3 month view for 10/05/2015   ICM trend: 1 year view for 10/05/2015   Follow-up plan: ICM clinic phone appointment on 11/05/2015.  Spoke with wife, [name].  Thoracic impedance above reference line from 08/31/2015 to 10/05/2015 suggesting no fluid accumulation.  Wife stated his legs are swollen during the day but go down at night when he is sleeping.  She stated that is a circulation problem.  Patient does not have any fluid symptoms.   Patient is having gen change out on 10/09/2015.   No changes today.   Copy of note sent to patient's primary care physician, primary cardiologist, and device following physician.  Rosalene Billings, RN, CCM 10/05/2015 8:23 AM

## 2015-10-09 ENCOUNTER — Encounter (HOSPITAL_COMMUNITY)
Admission: RE | Disposition: A | Discharge status: Home / Self Care | Payer: Self-pay | Source: Ambulatory Visit | Attending: Cardiovascular Disease

## 2015-10-09 ENCOUNTER — Ambulatory Visit (HOSPITAL_COMMUNITY)
Admission: RE | Admit: 2015-10-09 | Discharge: 2015-10-09 | Disposition: A | Discharge status: Home / Self Care | Payer: Medicare Other | Source: Ambulatory Visit | Attending: Cardiovascular Disease | Admitting: Cardiovascular Disease

## 2015-10-09 DIAGNOSIS — Z87891 Personal history of nicotine dependence: Secondary | ICD-10-CM | POA: Diagnosis not present

## 2015-10-09 DIAGNOSIS — Z4502 Encounter for adjustment and management of automatic implantable cardiac defibrillator: Secondary | ICD-10-CM | POA: Diagnosis not present

## 2015-10-09 DIAGNOSIS — I5023 Acute on chronic systolic (congestive) heart failure: Secondary | ICD-10-CM | POA: Diagnosis present

## 2015-10-09 DIAGNOSIS — I5042 Chronic combined systolic (congestive) and diastolic (congestive) heart failure: Secondary | ICD-10-CM | POA: Diagnosis not present

## 2015-10-09 DIAGNOSIS — Z7982 Long term (current) use of aspirin: Secondary | ICD-10-CM | POA: Insufficient documentation

## 2015-10-09 DIAGNOSIS — I480 Paroxysmal atrial fibrillation: Secondary | ICD-10-CM | POA: Insufficient documentation

## 2015-10-09 DIAGNOSIS — I5022 Chronic systolic (congestive) heart failure: Secondary | ICD-10-CM | POA: Diagnosis not present

## 2015-10-09 DIAGNOSIS — Z951 Presence of aortocoronary bypass graft: Secondary | ICD-10-CM | POA: Insufficient documentation

## 2015-10-09 DIAGNOSIS — E785 Hyperlipidemia, unspecified: Secondary | ICD-10-CM | POA: Diagnosis not present

## 2015-10-09 DIAGNOSIS — I255 Ischemic cardiomyopathy: Secondary | ICD-10-CM | POA: Insufficient documentation

## 2015-10-09 DIAGNOSIS — Z7902 Long term (current) use of antithrombotics/antiplatelets: Secondary | ICD-10-CM | POA: Diagnosis not present

## 2015-10-09 DIAGNOSIS — I739 Peripheral vascular disease, unspecified: Secondary | ICD-10-CM | POA: Diagnosis not present

## 2015-10-09 DIAGNOSIS — Z79899 Other long term (current) drug therapy: Secondary | ICD-10-CM | POA: Insufficient documentation

## 2015-10-09 DIAGNOSIS — Z95810 Presence of automatic (implantable) cardiac defibrillator: Secondary | ICD-10-CM | POA: Diagnosis present

## 2015-10-09 DIAGNOSIS — D509 Iron deficiency anemia, unspecified: Secondary | ICD-10-CM | POA: Diagnosis not present

## 2015-10-09 DIAGNOSIS — Z8572 Personal history of non-Hodgkin lymphomas: Secondary | ICD-10-CM | POA: Insufficient documentation

## 2015-10-09 DIAGNOSIS — I2510 Atherosclerotic heart disease of native coronary artery without angina pectoris: Secondary | ICD-10-CM | POA: Insufficient documentation

## 2015-10-09 DIAGNOSIS — I110 Hypertensive heart disease with heart failure: Secondary | ICD-10-CM | POA: Insufficient documentation

## 2015-10-09 DIAGNOSIS — I447 Left bundle-branch block, unspecified: Secondary | ICD-10-CM | POA: Diagnosis not present

## 2015-10-09 HISTORY — PX: EP IMPLANTABLE DEVICE: SHX172B

## 2015-10-09 LAB — SURGICAL PCR SCREEN
MRSA, PCR: NEGATIVE
Staphylococcus aureus: NEGATIVE

## 2015-10-09 SURGERY — ICD/BIV ICD GENERATOR CHANGEOUT

## 2015-10-09 MED ORDER — SODIUM CHLORIDE 0.9 % IV SOLN
INTRAVENOUS | Status: DC
  Administered 2015-10-09: 12:00:00 via INTRAVENOUS

## 2015-10-09 MED ORDER — MUPIROCIN 2 % EX OINT
1.0000 "application " | TOPICAL_OINTMENT | Freq: Once | CUTANEOUS | Status: AC
  Administered 2015-10-09: 1 via TOPICAL

## 2015-10-09 MED ORDER — SODIUM CHLORIDE 0.9 % IR SOLN
80.0000 mg | Status: DC
  Filled 2015-10-09: qty 2

## 2015-10-09 MED ORDER — CEFAZOLIN SODIUM-DEXTROSE 2-3 GM-% IV SOLR
INTRAVENOUS | Status: AC
  Filled 2015-10-09: qty 50

## 2015-10-09 MED ORDER — SODIUM CHLORIDE 0.9 % IR SOLN
Status: AC
  Filled 2015-10-09: qty 2

## 2015-10-09 MED ORDER — CEFAZOLIN SODIUM-DEXTROSE 2-4 GM/100ML-% IV SOLN
2.0000 g | INTRAVENOUS | Status: DC
  Filled 2015-10-09: qty 100

## 2015-10-09 MED ORDER — LIDOCAINE HCL (PF) 1 % IJ SOLN
INTRAMUSCULAR | Status: AC
  Filled 2015-10-09: qty 30

## 2015-10-09 MED ORDER — HEPARIN (PORCINE) IN NACL 2-0.9 UNIT/ML-% IJ SOLN
INTRAMUSCULAR | Status: AC
  Filled 2015-10-09: qty 500

## 2015-10-09 MED ORDER — CEFAZOLIN SODIUM-DEXTROSE 2-3 GM-% IV SOLR
INTRAVENOUS | Status: DC | PRN
  Administered 2015-10-09: 2 g via INTRAVENOUS

## 2015-10-09 MED ORDER — MUPIROCIN 2 % EX OINT
TOPICAL_OINTMENT | CUTANEOUS | Status: AC
  Administered 2015-10-09: 1 via TOPICAL
  Filled 2015-10-09: qty 22

## 2015-10-09 MED ORDER — HEPARIN (PORCINE) IN NACL 2-0.9 UNIT/ML-% IJ SOLN
INTRAMUSCULAR | Status: DC | PRN
  Administered 2015-10-09: 500 mL

## 2015-10-09 MED ORDER — LIDOCAINE HCL (PF) 1 % IJ SOLN
INTRAMUSCULAR | Status: DC | PRN
  Administered 2015-10-09: 45 mL

## 2015-10-09 SURGICAL SUPPLY — 4 items
CABLE SURGICAL S-101-97-12 (CABLE) ×2 IMPLANT
PAD DEFIB LIFELINK (PAD) ×2 IMPLANT
PPM CONSULTA CRT-P C4TR01 (Pacemaker) ×2 IMPLANT
TRAY PACEMAKER INSERTION (PACKS) ×2 IMPLANT

## 2015-10-09 NOTE — Interval H&P Note (Signed)
History and Physical Interval Note:  10/09/2015 11:48 AM  Dakota Liu  has presented today for surgery, with the diagnosis of eol  The various methods of treatment have been discussed with the patient and family. After consideration of risks, benefits and other options for treatment, the patient has consented to  Procedure(s): ICD to [place] (N/A) as a surgical intervention .  The patient's history has been reviewed, patient examined, no change in status, stable for surgery.  I have reviewed the patient's chart and labs.  Questions were answered to the patient's satisfaction.     Aydian Dimmick

## 2015-10-09 NOTE — Discharge Instructions (Signed)
Supplemental Discharge Instructions for  Pacemaker/Defibrillator Patients  Activity Do not raise your left/right arm above shoulder level or extend it backward beyond shoulder level for 2 weeks. Wear the arm sling as a reminder or as needed for comfort for 2 weeks. No heavy lifting or vigorous activity with your left/right arm for 6-8 weeks.    NO DRIVING is preferable for 2 weeks; If absolutely necessary, drive only short, familiar routes. DO wear your seatbelt, even if it crosses over the pacemaker site.  WOUND CARE - Keep the wound area clean and dry.  Remove the dressing the day after you return home (usually 48 hours after the procedure). - DO NOT SUBMERGE UNDER WATER UNTIL FULLY HEALED (no tub baths, hot tubs, swimming pools, etc.).  - You  may shower or take a sponge bath after the dressing is removed. DO NOT SOAK the area and do not allow the shower to directly spray on the site. - If you have staples, these will be removed in the office in 7-14 days. - If you have tape/steri-strips on your wound, these will fall off; do not pull them off prematurely.   - No bandage is needed on the site.  DO  NOT apply any creams, oils, or ointments to the wound area. - If you notice any drainage or discharge from the wound, any swelling, excessive redness or bruising at the site, or if you develop a fever > 101? F after you are discharged home, call the office at once.  Special Instructions - You are still able to use cellular telephones.  Avoid carrying your cellular phone near your device. - When traveling through airports, show security personnel your identification card to avoid being screened in the metal detectors.  - Avoid arc welding equipment, MRI testing (magnetic resonance imaging), TENS units (transcutaneous nerve stimulators).  Call the office for questions about other devices. - Avoid electrical appliances that are in poor condition or are not properly grounded. - Microwave ovens are  safe to be near or to operate.  Pacemaker Battery Change, Care After Refer to this sheet in the next few weeks. These instructions provide you with information on caring for yourself after your procedure. Your health care provider may also give you more specific instructions. Your treatment has been planned according to current medical practices, but problems sometimes occur. Call your health care provider if you have any problems or questions after your procedure. WHAT TO EXPECT AFTER THE PROCEDURE After your procedure, it is typical to have the following sensations: Soreness at the pacemaker site. HOME CARE INSTRUCTIONS  Keep the incision clean and dry. Unless advised otherwise, you may shower beginning 48 hours after your procedure. For the first week after the replacement, avoid stretching motions that pull at the incision site, and avoid heavy exercise with the arm that is on the same side as the incision. Take medicines only as directed by your health care provider. Keep all follow-up visits as directed by your health care provider. SEEK MEDICAL CARE IF:  You have pain at the incision site that is not relieved by over-the-counter or prescription medicine. There is drainage or pus from the incision site. There is swelling larger than a lime at the incision site. You develop red streaking that extends above or below the incision site. You feel brief, intermittent palpitations, light-headedness, or any symptoms that you feel might be related to your heart. SEEK IMMEDIATE MEDICAL CARE IF:  You experience chest pain that is different than  the pain at the pacemaker site. You experience shortness of breath. You have palpitations or irregular heartbeat. You have light-headedness that does not go away quickly. You faint. You have pain that gets worse and is not relieved by medicine.   This information is not intended to replace advice given to you by your health care provider. Make sure you  discuss any questions you have with your health care provider.   Document Released: 04/13/2013 Document Revised: 07/14/2014 Document Reviewed: 04/13/2013 Elsevier Interactive Patient Education [employer].

## 2015-10-09 NOTE — H&P (Signed)
Chief Complaint:  Defibrillator battery depletion  HPI:  Dakota Liu is a 82 y.o. 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  His CRT-D dual-chamber device reached RRT on 09/08/2015 and he is here for generator replacement. He has never received defibrillator shocks from his device. He is not pacemaker dependent. He seems to be a cardiac resynchronization responder with improvement in ejection fraction from 30% up to 40-45%.  He has not had any episodes of heart failure exacerbation or changes to his diuretic prescription in the last year. His thoracic impedance (Optivol) levels have been steady.  He recently had severe anemia (Hgb 5.3) and required transfusion. He is taking an iron supplement. Just a few days ago his hemoglobin was still low at 9.2 with microcytic indices. No overt bleeding site has been identified. Despite a history of paroxysmal atrial fibrillation, he is not taking anticoagulation  A 6 minute episode of atrial flutter occurred in April 2016, no meaningful events since. He has 4% atrial pacing and 98% biventricular pacing. Left ventricular lead capture threshold is fairly high, but remains acceptable and he does not have any diaphragmatic/intercostal muscle stimulation.  He had CABG in '88 with subsequent PCI 12/11 (distal RCA stent), 3/12 ([place] angioplasty for in-stent restenosis). Last cardiac cath in October 2014 showed severe native vessel disease (70% left main, 95% ostial LAD occluded proximally, all OM branch is occluded, right coronary artery occluded proximally) but with patent grafts (LIMA to the LAD, SVG to diagonal, sequential SVG to OM1 and-1 to-13, SVG to RCA) and patent site of previous stent in the distal  RCA.  He had moderate to severe ischemic cardiopathy with estimated LVEF of 30% and akinesis of the mid to distal anterior wall and mid to distal inferior wall and apical dyskinesis. He received a CRT-D Medtronic device in December 2011 with good response and an increase in LVEF to 40-45%.   He has treated hypertension and hyperlipidemia and a previous history of lymphoma and iron deficiency anemia. He wears braces on his ankles for unsteady joints. His pacemaker has recorded occasional atrial fibrillation but he is not receiving warfarin secondary to unsteady gait and falls, as well as iron deficiency anemia. No history of stroke/TIA or other embolic events.  PMHx:  Past Medical History  Diagnosis Date  . HTN (hypertension)   . Dyslipidemia   . Lymphoma ([place])     with metal radiation about 2006  . Epididymitis   . PAF (paroxysmal atrial fibrillation) ([place]) 12/11  . Anemia, iron deficiency 11/21/2011  . CAD (coronary artery disease) '88, 12/11, 3/12    CABG'88, PCI 12/1, 3/12  . Ischemic cardiomyopathy 10/14    EF improved to 40-45% after BiV ICD  . CHF (congestive heart failure) ([place])   . VT (ventricular tachycardia) ([place]) 12/11  . PVD (peripheral vascular disease) ([place]) 11/10    AAA R&G  . LBBB (left bundle branch block)     Past Surgical History  Procedure Laterality Date  . Tonsillectomy    . Orchiectomy    . Coronary artery bypass graft  1988  . Abdominal aortic aneurysm repair  11/10  . Cardiac defibrillator placement  12/11  . Coronary angioplasty with stent placement  07/05/10, 3/12    RCA with ISR 3/12  . Left heart catheterization with coronary/graft angiogram N/A  05/03/2013    Procedure: LEFT HEART CATHETERIZATION WITH Beatrix Fetters;  Surgeon: Peter M Martinique, MD;  Location: [facility] CATH LAB;  Service: Cardiovascular;  Laterality: N/A;    FAMHx:  Family History  Problem Relation Age of Onset  . Diabetes Mother   . Heart Problems Mother   . Stroke Father     . Cancer Sister     SOCHx:   reports that he quit smoking about 31 years ago. His smoking use included Cigarettes. He has a 44 pack-year smoking history. He has never used smokeless tobacco. He reports that he does not drink alcohol or use illicit drugs.  ALLERGIES:  Allergies  Allergen Reactions  . Tramadol Nausea And Vomiting and Other (See Comments)    Dry heaving, GI upset  . Ace Inhibitors Cough  . Codeine Nausea And Vomiting  . Simvastatin     Myalgia- high doses (takes 20mg  at home without issue)    ROS: Pertinent items are noted in HPI.  HOME MEDS: Medications Prior to Admission  Medication Sig Dispense Refill  . clopidogrel (PLAVIX) 75 MG tablet Take 1 tablet (75 mg total) by mouth daily. 30 tablet 0  . furosemide (LASIX) 20 MG tablet Take 3 tablets (60 mg total) by mouth daily. 90 tablet 0  . iron polysaccharides (NU-IRON) 150 MG capsule Take 1 capsule (150 mg total) by mouth 2 (two) times daily. 60 capsule 0  . KLOR-CON M20 20 MEQ tablet TAKE 1 TABLET BY MOUTH EVERY DAY 90 tablet 2  . metoprolol succinate (TOPROL-XL) 50 MG 24 hr tablet Take 50 mg by mouth daily.  5  . pantoprazole (PROTONIX) 40 MG tablet Take 1 tablet (40 mg total) by mouth 2 (two) times daily. 60 tablet 0  . ranolazine (RANEXA) 500 MG 12 hr tablet Take 1 tablet (500 mg total) by mouth 2 (two) times daily. 60 tablet 0  . simvastatin (ZOCOR) 40 MG tablet Take 40 mg by mouth daily.    Marland Kitchen aspirin 81 MG tablet Take 81 mg by mouth daily.    . nitroGLYCERIN (NITROSTAT) 0.4 MG SL tablet Place 0.4 mg under the tongue every 5 (five) minutes as needed.        LABS/IMAGING: Results for orders placed or performed during the hospital encounter of 10/09/15 (from the past 48 hour(s))  Surgical pcr screen     Status: None   Collection Time: 10/09/15 12:59 PM  Result Value Ref Range   MRSA, PCR NEGATIVE NEGATIVE   Staphylococcus aureus NEGATIVE NEGATIVE    Comment:        The Xpert SA Assay (FDA approved for  NASAL specimens in patients over 78 years of age), is one component of a comprehensive surveillance program.  Test performance has been validated by [facility] for patients greater than or equal to 10 year old. It is not intended to diagnose infection nor to guide or monitor treatment.    No results found.  VITALS: Blood pressure 144/96, pulse 78, temperature 97.2 F (36.2 C), temperature source Oral, resp. rate 18, height 6\' 3"  (1.905 m), weight 80.287 kg (177 lb), SpO2 96 %.  EXAM:  General: Alert, oriented x3, no distress Head: no evidence of trauma, PERRL, EOMI, no exophtalmos or lid lag, no myxedema, no xanthelasma; normal ears, nose and oropharynx Neck: Normal jugular venous pulsations and no hepatojugular reflux; brisk carotid pulses without delay and no carotid bruits Chest: clear to auscultation, no signs of consolidation by percussion or palpation, normal fremitus, symmetrical  and full respiratory excursions, the left subclavian defibrillator site appears healthy Cardiovascular: normal position and quality of the apical impulse, regular rhythm, normal first heart sound and paradoxically split second heart sounds, no rubs or gallops, no murmur Abdomen: no tenderness or distention, no masses by palpation, no abnormal pulsatility or arterial bruits, normal bowel sounds, no hepatosplenomegaly Extremities: no clubbing, cyanosis or edema; 2+ radial, ulnar and brachial pulses bilaterally; 2+ right femoral, posterior tibial and dorsalis pedis pulses; 2+ left femoral, posterior tibial and dorsalis pedis pulses; no subclavian or femoral bruits Neurological: grossly nonfocal   IMPRESSION: CRT-D at elective replacement interval. Combined systolic and diastolic heart failure, euvolemic, NYHA functional class II CAD s/p CABG and PCI Brief paroxysmal atrial fibrillation  PLAN:   Dakota Liu cardiac resynchronization device has reached elective replacement indicator. He has  benefited from cardiac resynchronization therapy, but at age 76 and without a history of defibrillator discharges for ventricular tachycardia, ICD therapy no longer appears the appropriate option. We'll downgrade the device to CRT-P, as long as we can obtain an adequate pacing vector with the available options. He does have high left ventricular lead pacing thresholds, but will try to avoid basement of a new left ventricular lead. This procedure has been fully reviewed with the patient and written informed consent has been obtained.   Sanda Klein, MD, [facility] [phone] office [phone] pager  10/09/2015, 2:34 PM

## 2015-10-09 NOTE — Op Note (Signed)
Procedure report  Procedure performed:  1. Removal of CRT-D generator 2. Insertion of new CRT-P generator using existing leads  Reason for procedure:  1. Device generator at elective replacement interval  2. LBBB 3. Chronic systolic heart failure, CRT responder Procedure performed by:  Sanda Klein, MD  Complications:  None  Estimated blood loss:  <5 mL  Medications administered during procedure:  Ancef 2 g intravenously,  lidocaine 1% 30 mL locally  Device details:   New Generator Medtronic [id number], serial number [id number] Right atrial lead (chronic) Medtronic [id number], serial number [id number] (implanted 07/05/2010) Right ventricular lead (chronic)  Medtronic [id number], serial number [id number] (implanted 07/05/2010) Left ventricular lead  Medtronic 4196-8*, serial number [id number] (implanted 07/05/2010)   Explanted generator Medtronic Protecta [id number], serial number  [id number] (implanted 07/05/2010)  Procedure details:  After the risks and benefits of the procedure were discussed the patient provided informed consent. She was brought to the cardiac catheter lab in the fasting state. The patient was prepped and draped in usual sterile fashion. Local anesthesia with 1% lidocaine was administered to to the left infraclavicular area. A 5-6cm horizontal incision was made parallel with and 2-3 cm caudal to the left clavicle, in the area of an old scar. An older scar was seen closer to the left clavicle. Using minimal electrocautery and mostly sharp and blunt dissection the prepectoral pocket was opened carefully to avoid injury to the loops of chronic leads. Extensive dissection was necessary, as redundant loops of the LV lead were located anterior to the can and looped around it. These were carefully freed to avoid injury to the lead insulation.The device was explanted. The pocket was carefully inspected for hemostasis and flushed with copious amounts of antibiotic solution.  The leads  were disconnected from the old generator and testing of the lead parameters showed excellent values. The new generator was connected to the chronic leads, with appropriate pacing noted.   Multiple LV pacing vectors were tested, with the best threshold being recorded in the LV tip to can configuration, with option for unipolar LV tip-to-RV ring if necessary in the future.  The entire system was then carefully inserted in the pocket with care been taking that the leads and device assumed a comfortable position without pressure on the incision. Great care was taken that the leads be located deep to the generator. The pocket was then closed in layers using 2 layers of 2-0 Vicryl and cutaneous staples after which a sterile dressing was applied.   At the end of the procedure the following lead parameters were encountered:   Right atrial lead sensed P waves 3 mV, impedance 470 ohms, threshold 0.6 at 0.5 ms pulse width.  Right ventricular lead sensed R waves  18.7 mV, impedance 455 ohms, threshold 0.8 at 0.5 ms pulse width.  Left ventricular lead sensed R waves  20 mV, impedance 1100 ohms, threshold 4.5 at 1.5 ms pulse width.  Sanda Klein, MD, [facility] [phone] office [phone] pager

## 2015-10-09 NOTE — H&P (View-Only) (Signed)
CRTD check in clinic for alert tones heard. RRT on 09/08/15. Alert tones turned off. Will route chart to Satira Sark and Dr. Sallyanne Kuster for scheduling generator replacement/ possible downgrade to CRTP.  ICM Clinic will continue to monitor.

## 2015-10-10 ENCOUNTER — Encounter (HOSPITAL_COMMUNITY): Payer: Self-pay | Admitting: Cardiovascular Disease

## 2015-10-10 MED FILL — Sodium Chloride Irrigation Soln 0.9%: Qty: 500 | Status: AC

## 2015-10-10 MED FILL — Gentamicin Sulfate Inj 40 MG/ML: INTRAMUSCULAR | Qty: 2 | Status: AC

## 2015-10-17 ENCOUNTER — Ambulatory Visit (INDEPENDENT_AMBULATORY_CARE_PROVIDER_SITE_OTHER): Payer: Medicare Other | Admitting: *Deleted

## 2015-10-17 DIAGNOSIS — Z95810 Presence of automatic (implantable) cardiac defibrillator: Secondary | ICD-10-CM | POA: Diagnosis not present

## 2015-10-17 NOTE — Progress Notes (Addendum)
CRT-P device check in clinic. Normal device function. Thresholds, sensing, impedance consistent with previous measurements. Histograms appropriate for patient and level of activity. No mode switches or ventricular high rate episodes. Patient bi-ventricularly pacing 97.5% of the time. Device programmed with appropriate safety margins with chronic outputs d/t gen change. Battery voltage at 3.01V. Staples removed. Wound without redness or edema. Incision edges approximated, wound well healed. Rov with MC in 86mo.

## 2015-10-17 NOTE — Progress Notes (Signed)
Remote ICM transmission rescheduled to 11/20/2015 due to patient had recent gen change.

## 2015-11-01 ENCOUNTER — Other Ambulatory Visit: Payer: Self-pay | Admitting: Cardiovascular Disease

## 2015-11-01 NOTE — Telephone Encounter (Signed)
REFILL 

## 2015-11-19 ENCOUNTER — Ambulatory Visit (INDEPENDENT_AMBULATORY_CARE_PROVIDER_SITE_OTHER): Payer: Medicare Other

## 2015-11-19 ENCOUNTER — Telehealth: Payer: Self-pay | Admitting: Cardiology

## 2015-11-19 DIAGNOSIS — Z95810 Presence of automatic (implantable) cardiac defibrillator: Secondary | ICD-10-CM

## 2015-11-19 DIAGNOSIS — I5022 Chronic systolic (congestive) heart failure: Secondary | ICD-10-CM

## 2015-11-19 NOTE — Telephone Encounter (Signed)
LMOVM reminding pt to send remote transmission.   

## 2015-11-21 ENCOUNTER — Telehealth: Payer: Self-pay

## 2015-11-21 NOTE — Telephone Encounter (Signed)
Remote ICM transmission received.  Attempted patient call and line busy for several attempts.Dakota Liu

## 2015-11-21 NOTE — Progress Notes (Signed)
EPIC Encounter for ICM Monitoring  Patient Name: Dakota Liu is a 83 y.o. male Date: 11/21/2015 Primary Care Physican: Woody Seller, MD Primary Cardiologist: Croitoru Electrophysiologist: Croitoru Dry Weight: unknown  Bi-V Pacing 98.4%      In the past month, have you:  1. Gained more than 2 pounds in a day or more than 5 pounds in a week? N/A  2. Had changes in your medications (with verification of current medications)? N/A  3. Had more shortness of breath than is usual for you? N/A  4. Limited your activity because of shortness of breath? N/A  5. Not been able to sleep because of shortness of breath? N/A  6. Had increased swelling in your feet, ankles, legs or stomach area? N/A  7. Had symptoms of dehydration (dizziness, dry mouth, increased thirst, decreased urine output) N/A  8. Had changes in sodium restriction? N/A  9. Been compliant with medication? N/A  ICM trend: 3 month view for 11/19/2015   ICM trend: 1 year view for 11/19/2015   Follow-up plan: ICM clinic phone appointment 12/25/2015.    Gen change on 10/09/2015.  Attempted call to patient and line busy.  Unable to reach patient/wife.   FLUID LEVELS:   Optivol thoracic impedance trending along reference line.      Rosalene Billings, RN, CCM 11/21/2015 9:29 AM

## 2015-11-28 ENCOUNTER — Encounter: Payer: Self-pay | Admitting: Cardiovascular Disease

## 2015-12-07 ENCOUNTER — Encounter: Payer: Self-pay | Admitting: Cardiovascular Disease

## 2015-12-25 ENCOUNTER — Ambulatory Visit (INDEPENDENT_AMBULATORY_CARE_PROVIDER_SITE_OTHER): Payer: Medicare Other

## 2015-12-25 ENCOUNTER — Telehealth: Payer: Self-pay

## 2015-12-25 DIAGNOSIS — I5022 Chronic systolic (congestive) heart failure: Secondary | ICD-10-CM

## 2015-12-25 DIAGNOSIS — Z95810 Presence of automatic (implantable) cardiac defibrillator: Secondary | ICD-10-CM

## 2015-12-25 NOTE — Progress Notes (Signed)
EPIC Encounter for ICM Monitoring  Patient Name: Dakota Liu is a 82 y.o. male Date: 12/25/2015 Primary Care Physican: Woody Seller, MD Primary Cardiologist: Croitoru Electrophysiologist: Croitoru Dry Weight: unknown  Bi-V Pacing 98.3%      In the past month, have you:  1. Gained more than 2 pounds in a day or more than 5 pounds in a week? No  2. Had changes in your medications (with verification of current medications)? No  3. Had more shortness of breath than is usual for you? No   4. Limited your activity because of shortness of breath? No   5. Not been able to sleep because of shortness of breath? No   6. Had increased swelling in your feet, ankles, legs or stomach area? No   7. Had symptoms of dehydration (dizziness, dry mouth, increased thirst, decreased urine output) No   8. Had changes in sodium restriction? No   9. Been compliant with medication? Yes   ICM trend: 3 month view for 12/25/2015   ICM trend: 1 year view for 12/25/2015    Follow-up plan: ICM clinic phone appointment 02/22/2016.  Office appointment with Dr Sallyanne Kuster on 01/22/2016.      FLUID LEVELS: Optivol thoracic impedance trending along reference line suggesting stable fluid levels.    SYMPTOMS:  None, denied any fluid symptoms such as weight gain of 3 pounds overnight or 5 pounds within a week, SOB and/or lower extremity swelling. Encouraged to call for any fluid symptoms.   EDUCATION: Limit sodium intake to < 2000 mg and fluid intake to 64 oz daily.     RECOMMENDATIONS: No changes today.     Rosalene Billings, RN, CCM 12/25/2015 2:07 PM

## 2015-12-25 NOTE — Telephone Encounter (Signed)
Spoke with patient and requested he send ICM remote transmission.  Explained it did not come automatically.  He stated he would send today.

## 2015-12-28 LAB — CUP PACEART INCLINIC DEVICE CHECK: MDC IDC SESS DTM: 20170623163733

## 2016-01-22 ENCOUNTER — Ambulatory Visit (INDEPENDENT_AMBULATORY_CARE_PROVIDER_SITE_OTHER): Payer: Medicare Other | Admitting: Cardiovascular Disease

## 2016-01-22 VITALS — BP 135/69 | HR 75 | Ht 74.0 in | Wt 177.0 lb

## 2016-01-22 DIAGNOSIS — I5022 Chronic systolic (congestive) heart failure: Secondary | ICD-10-CM

## 2016-01-22 DIAGNOSIS — I2510 Atherosclerotic heart disease of native coronary artery without angina pectoris: Secondary | ICD-10-CM | POA: Diagnosis not present

## 2016-01-22 DIAGNOSIS — I472 Ventricular tachycardia, unspecified: Secondary | ICD-10-CM

## 2016-01-22 DIAGNOSIS — Z950 Presence of cardiac pacemaker: Secondary | ICD-10-CM

## 2016-01-22 DIAGNOSIS — E785 Hyperlipidemia, unspecified: Secondary | ICD-10-CM

## 2016-01-22 DIAGNOSIS — I255 Ischemic cardiomyopathy: Secondary | ICD-10-CM

## 2016-01-22 DIAGNOSIS — D509 Iron deficiency anemia, unspecified: Secondary | ICD-10-CM

## 2016-01-22 DIAGNOSIS — I480 Paroxysmal atrial fibrillation: Secondary | ICD-10-CM

## 2016-01-22 LAB — CUP PACEART INCLINIC DEVICE CHECK
Battery Voltage: 3 V
Brady Statistic AP VP Percent: 30.73 %
Brady Statistic AS VP Percent: 68.98 %
Brady Statistic RA Percent Paced: 30.77 %
Implantable Lead Implant Date: 20111230
Implantable Lead Implant Date: 20111230
Implantable Lead Location: 753860
Implantable Lead Model: 5076
Lead Channel Impedance Value: 1368 Ohm
Lead Channel Impedance Value: 1995 Ohm
Lead Channel Impedance Value: 304 Ohm
Lead Channel Impedance Value: 361 Ohm
Lead Channel Impedance Value: 855 Ohm
Lead Channel Impedance Value: 950 Ohm
Lead Channel Pacing Threshold Amplitude: 0.75 V
Lead Channel Pacing Threshold Pulse Width: 0.4 ms
Lead Channel Pacing Threshold Pulse Width: 0.4 ms
Lead Channel Pacing Threshold Pulse Width: 1.5 ms
Lead Channel Sensing Intrinsic Amplitude: 1.75 mV
Lead Channel Sensing Intrinsic Amplitude: 1.75 mV
Lead Channel Setting Pacing Amplitude: 1.5 V
Lead Channel Setting Pacing Amplitude: 4.5 V
Lead Channel Setting Pacing Pulse Width: 0.5 ms
Lead Channel Setting Sensing Sensitivity: 2.8 mV
MDC IDC LEAD IMPLANT DT: 20111230
MDC IDC LEAD LOCATION: 753858
MDC IDC LEAD LOCATION: 753859
MDC IDC LEAD MODEL: 4196
MDC IDC MSMT BATTERY REMAINING LONGEVITY: 55 mo
MDC IDC MSMT LEADCHNL LV IMPEDANCE VALUE: 1254 Ohm
MDC IDC MSMT LEADCHNL LV PACING THRESHOLD AMPLITUDE: 3.25 V
MDC IDC MSMT LEADCHNL RA IMPEDANCE VALUE: 437 Ohm
MDC IDC MSMT LEADCHNL RV IMPEDANCE VALUE: 418 Ohm
MDC IDC MSMT LEADCHNL RV PACING THRESHOLD AMPLITUDE: 1.125 V
MDC IDC MSMT LEADCHNL RV SENSING INTR AMPL: 21.25 mV
MDC IDC MSMT LEADCHNL RV SENSING INTR AMPL: 21.25 mV
MDC IDC SESS DTM: 20170718145133
MDC IDC SET LEADCHNL LV PACING PULSEWIDTH: 1.5 ms
MDC IDC SET LEADCHNL RV PACING AMPLITUDE: 1.5 V
MDC IDC STAT BRADY AP VS PERCENT: 0.04 %
MDC IDC STAT BRADY AS VS PERCENT: 0.25 %
MDC IDC STAT BRADY RV PERCENT PACED: 99.71 %

## 2016-01-22 MED ORDER — SIMVASTATIN 20 MG PO TABS: 20.0000 mg | ORAL_TABLET | Freq: Every day | ORAL | Status: DC

## 2016-01-22 MED ORDER — FUROSEMIDE 20 MG PO TABS: 60.0000 mg | ORAL_TABLET | Freq: Every day | ORAL | Status: DC

## 2016-01-22 NOTE — Patient Instructions (Signed)
Dr Sallyanne Kuster has recommended making the following medication changes: 1. DECREASE Simvastatin to 20 mg daily 2. INCREASE Furosemide to 60 mg daily  Remote monitoring is used to monitor your Pacemaker of ICD from home. This monitoring reduces the number of office visits required to check your device to one time per year. It allows [place] to keep an eye on the functioning of your device to ensure it is working properly. You are scheduled for a device check from home on Tuesday, October 17th, 2017. You may send your transmission at any time that day. If you have a wireless device, the transmission will be sent automatically. After your physician reviews your transmission, you will receive a postcard with your next transmission date.  Dr Sallyanne Kuster recommends that you schedule a follow-up appointment in 12 months with a pacemaker check. You will receive a reminder letter in the mail two months in advance. If you don't receive a letter, please call our office to schedule the follow-up appointment.  If you need a refill on your cardiac medications before your next appointment, please call your pharmacy.

## 2016-01-22 NOTE — Progress Notes (Signed)
Cardiology Office Note    Date:  01/24/2016   ID:  Dakota Liu, DOB 06/17/1928, MRN [id number]  PCP:  Dakota Seller, MD  Cardiologist:   Dakota Klein, MD   Chief Complaint  Patient presents with  . Follow-up    pt denied chest pain, pt c/o SOB and swelling in feet and ankles    History of Present Illness:  Dakota Liu is a 82 y.o. 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  He is now roughly 3 months after his device generator change out with the Diovan grade from CRT-D to CRT-P (due to advanced age, improved LVEF and absence of VT/VF therapies). Estimated generates a longevity is 4.5 years (his left ventricular pacing threshold is chronically high at 3.5 V at 1.5 ms pulse width). He has 99.7% biventricular pacing. Atrial pacing also occurs 0.3% of the time. There has been no VT or VF. He has had one episode of atrial flutter with 2-1 atrioventricular block that only lasted for 4 minutes (atrial rate 200, ventricular rate 100 bpm). His optivol thoracic impedance is in normal range (just recently reached maturity after surgery).  He is generally doing quite well and denies chest pain or shortness of breath. He has mild ankle swelling, a little more prominent than usual (2+ on the right and 1+ on the left, long-standing asymmetry). He is taking only 40 mg of furosemide daily. He denies palpitations or syncope and has not had any focal neurological symptoms. He has not had any bleeding problems.  He had CABG in '88 with subsequent PCI 12/11 (distal RCA stent), 3/12 ([place] angioplasty for in-stent restenosis). Last cardiac cath in October 2014 showed severe native vessel disease (70% left main, 95% ostial LAD occluded proximally, all  OM branch is occluded, right coronary artery occluded proximally) but with patent grafts (LIMA to the LAD, SVG to diagonal, sequential SVG to OM1 and-1 to-13, SVG to RCA) and patent site of previous stent in the distal RCA.  He had moderate to severe ischemic cardiopathy with estimated LVEF of 30% and akinesis of the mid to distal anterior wall and mid to distal inferior wall and apical dyskinesis. He received a CRT-D [employer] device in December 2011 with good response and an increase in LVEF to 40-45%.  He has treated hypertension and hyperlipidemia and a previous history of lymphoma and iron deficiency anemia. He wears braces on his ankles for unsteady joints.  His pacemaker has recorded occasional atrial fibrillation but he is not receiving warfarin secondary to unsteady gait and falls. No history of stroke/TIA or other embolic events  Past Medical History  Diagnosis Date  . HTN (hypertension)   . Dyslipidemia   . Lymphoma ([place])     with metal radiation about 2006  . Epididymitis   . PAF (paroxysmal atrial fibrillation) ([place]) 12/11  . Anemia, iron deficiency 11/21/2011  . CAD (coronary artery disease) '88, 12/11, 3/12    CABG'88, PCI 12/1, 3/12  . Ischemic cardiomyopathy 10/14    EF improved to 40-45% after BiV ICD  . CHF (congestive heart failure) ([place])   . VT (ventricular tachycardia) ([place]) 12/11  . PVD (peripheral vascular disease) ([place]) 11/10    AAA R&G  . LBBB (left bundle branch block)     Past Surgical History  Procedure Laterality Date  .  Tonsillectomy    . Orchiectomy    . Coronary artery bypass graft  1988  . Abdominal aortic aneurysm repair  11/10  . Cardiac defibrillator placement  12/11  . Coronary angioplasty with stent placement  07/05/10, 3/12    RCA with ISR 3/12  . Left heart catheterization with coronary/graft angiogram N/A 05/03/2013    Procedure: LEFT HEART CATHETERIZATION WITH Dakota Liu;  Surgeon: Peter M Martinique, MD;  Location: [facility] CATH  LAB;  Service: Cardiovascular;  Laterality: N/A;  . Ep implantable device N/A 10/09/2015    Procedure: ICD to Mill[place];  Surgeon: Dakota Klein, MD;  Location: [facility] CV LAB;  Service: Cardiovascular;  Laterality: N/A;    Current Medications: Outpatient Prescriptions Prior to Visit  Medication Sig Dispense Refill  . clopidogrel (PLAVIX) 75 MG tablet Take 1 tablet (75 mg total) by mouth daily. 30 tablet 0  . iron polysaccharides (NU-IRON) 150 MG capsule Take 1 capsule (150 mg total) by mouth 2 (two) times daily. 60 capsule 0  . KLOR-CON M20 20 MEQ tablet TAKE 1 TABLET BY MOUTH EVERY DAY 90 tablet 2  . metoprolol succinate (TOPROL-XL) 50 MG 24 hr tablet Take 50 mg by mouth daily.  5  . nitroGLYCERIN (NITROSTAT) 0.4 MG SL tablet Place 0.4 mg under the tongue every 5 (five) minutes as needed.      . pantoprazole (PROTONIX) 40 MG tablet Take 1 tablet (40 mg total) by mouth 2 (two) times daily. 60 tablet 0  . ranolazine (RANEXA) 500 MG 12 hr tablet Take 1 tablet (500 mg total) by mouth 2 (two) times daily. 60 tablet 0  . furosemide (LASIX) 20 MG tablet TAKE 3 TABLETS (60 MG TOTAL) BY MOUTH DAILY. 90 tablet 2  . simvastatin (ZOCOR) 40 MG tablet Take 40 mg by mouth daily.     No facility-administered medications prior to visit.     Allergies:   Tramadol; Ace inhibitors; Codeine; and Simvastatin   Social History   Social History  . Marital Status: Married    Spouse Name: N/A  . Number of Children: N/A  . Years of Education: N/A   Social History Main Topics  . Smoking status: Former Smoker -- 1.00 packs/day for 44 years    Types: Cigarettes    Quit date: 07/07/1984  . Smokeless tobacco: Never Used  . Alcohol Use: No  . Drug Use: No  . Sexual Activity: Not Asked   Other Topics Concern  . None   Social History Narrative     Family History:  The patient's family history includes Cancer in his sister; Diabetes in his mother; Heart Problems in his mother; Stroke in  his father.   ROS:   Please see the history of present illness.    ROS All other systems reviewed and are negative.   PHYSICAL EXAM:   VS:  BP 135/69 mmHg  Pulse 75  Ht 6\' 2"  (1.88 m)  Wt 80.287 kg (177 lb)  BMI 22.72 kg/m2   GEN: Well nourished, well developed, in no acute distress HEENT: normal Neck: no JVD, carotid bruits, or masses Cardiac: Paradoxically split S2 RRR; no murmurs, rubs, or gallops, 2+ right ankle and low pretibial edema (SVG harvest site), 1+ left pretibial and ankle edema  Respiratory:  clear to auscultation bilaterally, normal work of breathing GI: soft, nontender, nondistended, + BS MS: no deformity or atrophy Skin: warm and dry, no rash Neuro:  Alert and Oriented x 3, Strength and sensation are intact  Psych: euthymic mood, full affect  Wt Readings from Last 3 Encounters:  01/22/16 80.287 kg (177 lb)  10/09/15 80.287 kg (177 lb)  09/04/15 79.379 kg (175 lb)      Studies/Labs Reviewed:   EKG:  EKG is not ordered today.    Recent Labs: 08/22/2015: ALT 8*; Magnesium 1.9 09/26/2015: BUN 18; Creat 1.01; Hemoglobin 9.9*; Platelets 196; Potassium 3.9; Sodium 137; TSH 2.18   Lipid Panel    Component Value Date/Time   CHOL  07/02/2010 0825    164        ATP III CLASSIFICATION:  <200     mg/dL   Desirable  200-239  mg/dL   Borderline High  >=240    mg/dL   High          TRIG 115 07/02/2010 0825   HDL 28* 07/02/2010 0825   CHOLHDL 5.9 07/02/2010 0825   VLDL 23 07/02/2010 0825   LDLCALC * 07/02/2010 0825    113        Total Cholesterol/HDL:CHD Risk Coronary Heart Disease Risk Table                     Men   Women  1/2 Average Risk   3.4   3.3  Average Risk       5.0   4.4  2 X Average Risk   9.6   7.1  3 X Average Risk  23.4   11.0        Use the calculated Patient Ratio above and the CHD Risk Table to determine the patient's CHD Risk.        ATP III CLASSIFICATION (LDL):  <100     mg/dL   Optimal  100-129  mg/dL   Near or Above                     Optimal  130-159  mg/dL   Borderline  160-189  mg/dL   High  >190     mg/dL   Very High      ASSESSMENT:    1. Biventricular cardiac pacemaker in situ   2. Chronic systolic congestive heart failure (HCC)   3. Cardiomyopathy, ischemic   4. Coronary artery disease involving native coronary artery of native heart without angina pectoris   5. VT (ventricular tachycardia) - 12/11   6. PAF - 12/11- (no Coumadin secondary to unsteady gait)   7. Dyslipidemia   8. Anemia, iron deficiency      PLAN:  In order of problems listed above:  1. CRT-P: Site well healed. He actually believes he feels better after down grade to a CRT-P device. Normal device function, with a little abbreviation of generator longevity due to high LV pacing threshold (chronic). Continue remote downloads every 3 months and at least yearly office visits 2. CHF: most recent EF 40-45%, well compensated symptoms, some ankle swelling. Normal thoracic impedance by device check. He was treated with beta blocker and angiotensin receptor blocker in the past these had to be gradually weaned and eventually stopped due to symptomatic hypotension. Will increase the furosemide dose to 60 mg daily.  3. CMP, ischemic  4. CAD s/p CABG and PCI: asymptomatic 5. VT: None recorded in several years  6. AFib: Episodes remain very brief and asymptomatic. The overall prevalence of arrhythmia is extremely low. Anticoagulation is avoided due to unsteady gait and falls, now also has severe iron deficiency anemia..  7. HLP: Labs from February 13  show LDL cholesterol 51, total cholesterol 103, triglycerides 97, HDL 36. Note potential interaction between Ranexa and simvastatin: will reduce the dose of simvastatin to 20 mg daily 8. Anemia :to help reduce the effects of iron deficiency, stopped aspirin and continued clopidogrel only  Medication Adjustments/Labs and Tests Ordered: Current medicines are reviewed at length with the patient  today.  Concerns regarding medicines are outlined above.  Medication changes, Labs and Tests ordered today are listed in the Patient Instructions below. Patient Instructions  Dr Sallyanne Kuster has recommended making the following medication changes: 1. DECREASE Simvastatin to 20 mg daily 2. INCREASE Furosemide to 60 mg daily  Remote monitoring is used to monitor your Pacemaker of ICD from home. This monitoring reduces the number of office visits required to check your device to one time per year. It allows [place] to keep an eye on the functioning of your device to ensure it is working properly. You are scheduled for a device check from home on Tuesday, October 17th, 2017. You may send your transmission at any time that day. If you have a wireless device, the transmission will be sent automatically. After your physician reviews your transmission, you will receive a postcard with your next transmission date.  Dr Sallyanne Kuster recommends that you schedule a follow-up appointment in 12 months with a pacemaker check. You will receive a reminder letter in the mail two months in advance. If you don't receive a letter, please call our office to schedule the follow-up appointment.  If you need a refill on your cardiac medications before your next appointment, please call your pharmacy.     Signed, Dakota Klein, MD  01/24/2016 5:48 PM    [address], [place], [place]  [address] Phone: [phone]; Fax: [phone]

## 2016-01-24 ENCOUNTER — Encounter: Payer: Self-pay | Admitting: Cardiovascular Disease

## 2016-01-24 DIAGNOSIS — Z950 Presence of cardiac pacemaker: Secondary | ICD-10-CM | POA: Insufficient documentation

## 2016-02-08 ENCOUNTER — Encounter: Payer: Self-pay | Admitting: Cardiovascular Disease

## 2016-02-22 ENCOUNTER — Telehealth: Payer: Self-pay | Admitting: Cardiology

## 2016-02-22 NOTE — Telephone Encounter (Signed)
Attempted to confirm remote transmission with pt. No answer and was unable to leave a message.   

## 2016-02-25 NOTE — Progress Notes (Signed)
No ICM remote transmission received 02/22/2016 and next one scheduled for 03/06/2016

## 2016-03-05 DIAGNOSIS — E876 Hypokalemia: Secondary | ICD-10-CM | POA: Insufficient documentation

## 2016-03-06 ENCOUNTER — Telehealth: Payer: Self-pay | Admitting: Cardiology

## 2016-03-06 NOTE — Telephone Encounter (Signed)
Confirmed remote transmission w/ pt wife.   

## 2016-03-13 NOTE — Progress Notes (Signed)
No ICM remote transmission received 03/06/2016 and next one scheduled for 03/06/2016.  Next ICM remote transmission scheduled for 04/22/2016.

## 2016-04-01 DIAGNOSIS — H16131 Photokeratitis, right eye: Secondary | ICD-10-CM | POA: Insufficient documentation

## 2016-04-22 ENCOUNTER — Telehealth: Payer: Self-pay | Admitting: Cardiology

## 2016-04-22 ENCOUNTER — Ambulatory Visit (INDEPENDENT_AMBULATORY_CARE_PROVIDER_SITE_OTHER): Payer: Medicare Other | Admitting: *Deleted

## 2016-04-22 DIAGNOSIS — I255 Ischemic cardiomyopathy: Secondary | ICD-10-CM

## 2016-04-22 DIAGNOSIS — I5022 Chronic systolic (congestive) heart failure: Secondary | ICD-10-CM

## 2016-04-22 DIAGNOSIS — Z950 Presence of cardiac pacemaker: Secondary | ICD-10-CM | POA: Diagnosis not present

## 2016-04-22 NOTE — Telephone Encounter (Signed)
LMOVM reminding pt to send remote transmission.   

## 2016-04-23 ENCOUNTER — Encounter: Payer: Self-pay | Admitting: Cardiology

## 2016-04-23 NOTE — Progress Notes (Signed)
EPIC Encounter for ICM Monitoring  Patient Name: Dakota Liu is a 82 y.o. male Date: 04/23/2016 Primary Care Physican: Woody Seller, MD Primary Cardiologist: Croitoru Electrophysiologist: Croitoru Dry Weight:    177 lbs known           V Pacing 92.3%       Clinical Status (06-Mar-2016 to 22-Apr-2016) AT/AF 3 episodes  Time in AT/AF 3.5 hr/day (14.5%)  Observations (3) (06-Mar-2016 to 22-Apr-2016)  7 days with more than 6 hr AT/AF.  Avg. Ventricular Rate >= 100 bpm for 6 hr during AT/AF.  Patient Activity less than 1 hr/day for 6 weeks.     Heart Failure questions reviewed, pt reported he is asymptomatic but wife stated he has some leg swelling in one leg  Thoracic impedance abnormal suggesting fluid accumulation since 04/06/2016.  Recommendations: Advised wife and patient to increase Furosemide 20 mg to 3 tablets in am and 2 tablets in pm x 3 days and after 3rd day return to 3 tablets in am.  Increase Klor Con 20 mEq 1 tablet bid x 3 days and return to prescribed dosage Klor Con 20 mEq 1 tablet daily.  Advised if patient feels any dizziness or lightheadedness from taking extra Furosemide to return to normal prescribed dosage and call ICM number.    Follow-up plan: ICM clinic phone appointment on 04/28/2016.  Copy of ICM check sent to cardiologist.   ICM trend: 04/22/2016    AT/AF   Rosalene Billings, RN 04/23/2016 10:02 AM

## 2016-04-23 NOTE — Progress Notes (Signed)
Remote pacemaker transmission.   

## 2016-04-28 ENCOUNTER — Ambulatory Visit (INDEPENDENT_AMBULATORY_CARE_PROVIDER_SITE_OTHER): Payer: Medicare Other

## 2016-04-28 DIAGNOSIS — I5022 Chronic systolic (congestive) heart failure: Secondary | ICD-10-CM | POA: Diagnosis not present

## 2016-04-28 DIAGNOSIS — Z950 Presence of cardiac pacemaker: Secondary | ICD-10-CM | POA: Diagnosis not present

## 2016-04-28 NOTE — Progress Notes (Signed)
EPIC Encounter for ICM Monitoring  Patient Name: Dakota Liu is a 82 y.o. male Date: 04/28/2016 Primary Care Physican: Woody Seller, MD Primary Cardiologist: Croitoru Electrophysiologist: Croitoru Dry Weight: 177 lb  Bi-V Pacing:  59.2% (was 92.3% on 04/23/2016) S ince last transmission on 04/23/2016: AT/AF 1 episode for 12 days Time in AT/AF 24.0 hr/day (100.0%) Event Summary ?AT/AF Daily Burden > Threshold  ?Fast V Rate During AT/AF  ?V. Pacing < 90%  ?2523 V. Sensing Episodes  ?1 SVT  ?6 days in AT/AF Since Last Session       Heart Failure questions reviewed, pt asymptomatic.  He stated he is feeling fine.     Thoracic impedance returned close to baseline - normal after increase Furosemide and Potassium x 3 days.  Recommendations: No changes.  Advised to limit salt intake to 2000 mg daily.  Encouraged to call for fluid symptoms.    Follow-up plan: ICM clinic phone appointment on 05/26/2016.  Copy of ICM check sent to primary cardiologist and device physician.   ICM trend: 04/28/2016           Rosalene Billings, RN 04/28/2016 2:36 PM

## 2016-04-29 ENCOUNTER — Telehealth: Payer: Self-pay

## 2016-04-29 NOTE — Progress Notes (Signed)
Call to patient to give Dr Croitoru's recommendation after reviewing remote transmission.  Advised patient to increase metoprolol succinate (TOPROL-XL) 50 MG 24 hr tablet   to 2 tablets a day for total of 100 mg daily.   He verbalized understanding.  Advised will send a message to the [facility] scheduling department to call him for an appointment with Dr Sallyanne Kuster next week.  No questions.

## 2016-04-29 NOTE — Telephone Encounter (Signed)
Call to patient to give Dr Croitoru's recommendation after reviewing remote transmission.  Advised patient increase Metoprolol 50 mg to 2 tablets a day for total of 100 mg daily.   He verbalized understanding.  Advised will send a message to the [facility] scheduling department to call him for an appointment with Dr Sallyanne Kuster next week.  No questions.       Sanda Klein, MD  Cardiology     Will double the metoprolol dose and bring him in next week.    Electronically signed by Sanda Klein, MD at 04/29/2016 1:15 PM

## 2016-04-29 NOTE — Progress Notes (Signed)
Will double the metoprolol dose and bring him in next week.

## 2016-05-02 LAB — CUP PACEART REMOTE DEVICE CHECK
Battery Voltage: 2.98 V
Brady Statistic AP VS Percent: 0.04 %
Brady Statistic AS VP Percent: 73.09 %
Brady Statistic RA Percent Paced: 21.01 %
Brady Statistic RV Percent Paced: 94.06 %
Implantable Lead Implant Date: 20111230
Implantable Lead Implant Date: 20111230
Implantable Lead Location: 753858
Implantable Lead Location: 753860
Implantable Lead Model: 5076
Implantable Lead Model: 6947
Lead Channel Impedance Value: 1254 Ohm
Lead Channel Impedance Value: 1995 Ohm
Lead Channel Impedance Value: 285 Ohm
Lead Channel Impedance Value: 418 Ohm
Lead Channel Impedance Value: 874 Ohm
Lead Channel Impedance Value: 988 Ohm
Lead Channel Pacing Threshold Amplitude: 0.625 V
Lead Channel Pacing Threshold Pulse Width: 0.4 ms
Lead Channel Pacing Threshold Pulse Width: 0.4 ms
Lead Channel Sensing Intrinsic Amplitude: 2.875 mV
Lead Channel Sensing Intrinsic Amplitude: 2.875 mV
Lead Channel Setting Pacing Amplitude: 1.5 V
Lead Channel Setting Pacing Amplitude: 1.5 V
Lead Channel Setting Pacing Pulse Width: 0.5 ms
Lead Channel Setting Sensing Sensitivity: 2.8 mV
MDC IDC LEAD IMPLANT DT: 20111230
MDC IDC LEAD LOCATION: 753859
MDC IDC LEAD MODEL: 4196
MDC IDC MSMT BATTERY REMAINING LONGEVITY: 47 mo
MDC IDC MSMT LEADCHNL LV IMPEDANCE VALUE: 1387 Ohm
MDC IDC MSMT LEADCHNL LV PACING THRESHOLD AMPLITUDE: 3.5 V
MDC IDC MSMT LEADCHNL LV PACING THRESHOLD PULSEWIDTH: 1.5 ms
MDC IDC MSMT LEADCHNL RA IMPEDANCE VALUE: 437 Ohm
MDC IDC MSMT LEADCHNL RV IMPEDANCE VALUE: 361 Ohm
MDC IDC MSMT LEADCHNL RV PACING THRESHOLD AMPLITUDE: 1.125 V
MDC IDC MSMT LEADCHNL RV SENSING INTR AMPL: 24.625 mV
MDC IDC MSMT LEADCHNL RV SENSING INTR AMPL: 24.625 mV
MDC IDC SESS DTM: 20171017235702
MDC IDC SET LEADCHNL LV PACING AMPLITUDE: 4.5 V
MDC IDC SET LEADCHNL LV PACING PULSEWIDTH: 1.5 ms
MDC IDC STAT BRADY AP VP PERCENT: 20.97 %
MDC IDC STAT BRADY AS VS PERCENT: 5.9 %

## 2016-05-07 ENCOUNTER — Encounter: Payer: Self-pay | Admitting: Cardiovascular Disease

## 2016-05-07 ENCOUNTER — Ambulatory Visit (INDEPENDENT_AMBULATORY_CARE_PROVIDER_SITE_OTHER): Payer: Medicare Other | Admitting: Cardiovascular Disease

## 2016-05-07 VITALS — BP 112/68 | HR 95 | Ht 73.0 in | Wt 173.0 lb

## 2016-05-07 DIAGNOSIS — D500 Iron deficiency anemia secondary to blood loss (chronic): Secondary | ICD-10-CM

## 2016-05-07 DIAGNOSIS — Z950 Presence of cardiac pacemaker: Secondary | ICD-10-CM

## 2016-05-07 DIAGNOSIS — I2510 Atherosclerotic heart disease of native coronary artery without angina pectoris: Secondary | ICD-10-CM | POA: Diagnosis not present

## 2016-05-07 DIAGNOSIS — I4891 Unspecified atrial fibrillation: Secondary | ICD-10-CM | POA: Diagnosis not present

## 2016-05-07 DIAGNOSIS — I472 Ventricular tachycardia, unspecified: Secondary | ICD-10-CM

## 2016-05-07 DIAGNOSIS — I5022 Chronic systolic (congestive) heart failure: Secondary | ICD-10-CM | POA: Diagnosis not present

## 2016-05-07 DIAGNOSIS — E785 Hyperlipidemia, unspecified: Secondary | ICD-10-CM

## 2016-05-07 DIAGNOSIS — I4819 Other persistent atrial fibrillation: Secondary | ICD-10-CM | POA: Insufficient documentation

## 2016-05-07 MED ORDER — APIXABAN 5 MG PO TABS: 5.0000 mg | ORAL_TABLET | Freq: Two times a day (BID) | ORAL | 5 refills | Status: DC

## 2016-05-07 MED ORDER — METOPROLOL SUCCINATE ER 50 MG PO TB24: ORAL_TABLET | ORAL | 5 refills | Status: DC

## 2016-05-07 NOTE — Patient Instructions (Signed)
Medication Instructions:  START METOPROLOL 100MG  (2TABLETS) IN THE AM AND 50MG  (1TABLET) IN THE PM START ELIQUIS 5MG  TWICE DAILY  Labwork: BEFORE PROCEDURE CBC, TSH AND BMET AT [facility]  Testing/Procedures: TEE CARDIOVERSION WITH DR [id number] ON November 14TH  Follow-Up: Your physician recommends that you schedule a follow-up appointment in: [place]     If you need a refill on your cardiac medications before your next appointment, please call your pharmacy.

## 2016-05-08 NOTE — Progress Notes (Signed)
Cardiology Office Note    Date:  05/08/2016   ID:  Dakota Liu, DOB 10/23/1927, MRN [id number]  PCP:  Dakota Seller, MD  Cardiologist:   Dakota Klein, MD   Chief Complaint  Patient presents with  . Follow-up    History of Present Illness:  Dakota Liu is a 82 y.o. male with a long-standing history of ischemic cardiomyopathy, well controlled systolic and diastolic heart failure, coronary artery disease with previous bypass surgery and percutaneous revascularization, PAD, paroxysmal atrial fibrillation and history of ventricular tachycardia.   About 6 months ago he underwent generator change out with downgrade from CRT-D to CRT-P (due to advanced age, improved LVEF and absence of VT/VF therapies).   Roughly 3 weeks ago routine device check showed new onset persistent atrial fibrillation with rapid ventricular response, associated with a marked reduction in biventricular pacing and a trend towards worsening volume status by Optivol. Previous episodes of atrial fibrillation had always been very brief and we had elected not to treat with anticoagulation due to unsteady gait and risk for falls as well as iron deficiency anemia. He has not noticed any reduction in his exercise tolerance, shortness of breath, edema or angina pectoris. He denies palpitations or any other irregularities and is beat. He has not had any change in minimal ankle edema. His weight is unchanged. He does describe recent enlargement of his left breast without any discrete masses.  He reports that he has not had any recent bleeding, falls or injuries.   He had CABG in '88 with subsequent PCI 12/11 (distal RCA stent), 3/12 ([place] angioplasty for in-stent restenosis). Last cardiac cath in October 2014 showed severe native vessel disease (70% left main, 95% ostial LAD occluded proximally, all OM branch is occluded, right coronary artery occluded proximally) but with patent grafts (LIMA to the LAD, SVG to  diagonal, sequential SVG to OM1 and-1 to-13, SVG to RCA) and patent site of previous stent in the distal RCA.  He had moderate to severe ischemic cardiopathy with estimated LVEF of 30% and akinesis of the mid to distal anterior wall and mid to distal inferior wall and apical dyskinesis. He received a CRT-D [employer] device in December 2011 with good response and an increase in LVEF to 40-45%.  He has treated hypertension and hyperlipidemia and a previous history of lymphoma and iron deficiency anemia. He wears braces on his ankles for unsteady joints.  His pacemaker has recorded occasional atrial fibrillation but he is not receiving warfarin secondary to unsteady gait and falls. No history of stroke/TIA or other embolic events  Past Medical History:  Diagnosis Date  . Anemia, iron deficiency 11/21/2011  . CAD (coronary artery disease) '88, 12/11, 3/12   CABG'88, PCI 12/1, 3/12  . CHF (congestive heart failure) ([place])   . Dyslipidemia   . Epididymitis   . HTN (hypertension)   . Ischemic cardiomyopathy 10/14   EF improved to 40-45% after BiV ICD  . LBBB (left bundle branch block)   . Lymphoma ([place])    with metal radiation about 2006  . PAF (paroxysmal atrial fibrillation) ([place]) 12/11  . PVD (peripheral vascular disease) ([place]) 11/10   AAA R&G  . VT (ventricular tachycardia) ([place]) 12/11    Past Surgical History:  Procedure Laterality Date  . ABDOMINAL AORTIC ANEURYSM REPAIR  11/10  . CARDIAC DEFIBRILLATOR PLACEMENT  12/11  . CORONARY ANGIOPLASTY WITH STENT PLACEMENT  07/05/10, 3/12   RCA with ISR 3/12  . CORONARY ARTERY BYPASS  GRAFT  1988  . EP IMPLANTABLE DEVICE N/A 10/09/2015   Procedure: ICD to [place];  Surgeon: Dakota Klein, MD;  Location: [facility];  Service: Cardiovascular;  Laterality: N/A;  . LEFT HEART CATHETERIZATION WITH CORONARY/GRAFT ANGIOGRAM N/A 05/03/2013   Procedure: LEFT HEART CATHETERIZATION WITH Dakota Liu;  Surgeon: Dakota M  Martinique, MD;  Location: [facility] C[facility] LAB;  Service: Cardiovascular;  Laterality: N/A;  . ORCHIECTOMY    . TONSILLECTOMY      Current Medications: Outpatient Medications Prior to Visit  Medication Sig Dispense Refill  . clopidogrel (PLAVIX) 75 MG tablet Take 1 tablet (75 mg total) by mouth daily. 30 tablet 0  . furosemide (LASIX) 20 MG tablet Take 3 tablets (60 mg total) by mouth daily. 90 tablet 11  . iron polysaccharides (NU-IRON) 150 MG capsule Take 1 capsule (150 mg total) by mouth 2 (two) times daily. 60 capsule 0  . KLOR-CON M20 20 MEQ tablet TAKE 1 TABLET BY MOUTH EVERY DAY (Patient taking differently: TAKE 2 TABLETS BY MOUTH EVERY DAY) 90 tablet 2  . nitroGLYCERIN (NITROSTAT) 0.4 MG SL tablet Place 0.4 mg under the tongue every 5 (five) minutes as needed.      . pantoprazole (PROTONIX) 40 MG tablet Take 1 tablet (40 mg total) by mouth 2 (two) times daily. 60 tablet 0  . ranolazine (RANEXA) 500 MG 12 hr tablet Take 1 tablet (500 mg total) by mouth 2 (two) times daily. 60 tablet 0  . simvastatin (ZOCOR) 20 MG tablet Take 1 tablet (20 mg total) by mouth daily. 30 tablet 11  . metoprolol succinate (TOPROL-XL) 50 MG 24 hr tablet Take 50 mg by mouth 2 (two) times daily.   5   No facility-administered medications prior to visit.      Allergies:   Tramadol; Ace inhibitors; Codeine; and Simvastatin   Social History   Social History  . Marital status: Married    Spouse name: N/A  . Number of children: N/A  . Years of education: N/A   Social History Main Topics  . Smoking status: Former Smoker    Packs/day: 1.00    Years: 44.00    Types: Cigarettes    Quit date: 07/07/1984  . Smokeless tobacco: Never Used  . Alcohol use No  . Drug use: No  . Sexual activity: Not Asked   Other Topics Concern  . None   Social History Narrative  . None     Family History:  The patient's family history includes Cancer in his sister; Diabetes in his mother; Heart Problems in his mother; Stroke in his  father.   ROS:   Please see the history of present illness.    ROS All other systems reviewed and are negative.   PHYSICAL EXAM:   VS:  BP 112/68   Pulse 95   Ht 6\' 1"  (1.854 m)   Wt 173 lb (78.5 kg)   BMI 22.82 kg/m    GEN: Well nourished, well developed, in no acute distress  HEENT: normal  Neck: no JVD, carotid bruits, or masses Cardiac: irregular, no murmurs, rubs, or gallops, 1+ right ankle and low pretibial edema (SVG harvest site), minimal left pretibial and ankle edema  Respiratory:  clear to auscultation bilaterally, normal work of breathing GI: soft, nontender, nondistended, + BS MS: no deformity or atrophy  Skin: warm and dry, no rash Neuro:  Alert and Oriented x 3, Strength and sensation are intact Psych: euthymic mood, full affect  Wt  Readings from Last 3 Encounters:  05/07/16 173 lb (78.5 kg)  01/22/16 177 lb (80.3 kg)  10/09/15 177 lb (80.3 kg)      Studies/Labs Reviewed:   EKG:  EKG is ordered today.  Shows atrial fibrillation. Although there is triggered ventricular pacing there is only pseudofusion without any true resynchronization. Broad QRS 134 ms, QTC 532 ms.  Recent Labs: 08/22/2015: ALT 8; Magnesium 1.9 09/26/2015: BUN 18; Creat 1.01; Hemoglobin 9.9; Platelets 196; Potassium 3.9; Sodium 137; TSH 2.18   Lipid Panel    Component Value Date/Time   CHOL  07/02/2010 0825    164        ATP III CLASSIFICATION:  <200     mg/dL   Desirable  200-239  mg/dL   Borderline High  >=240    mg/dL   High          TRIG 115 07/02/2010 0825   HDL 28 (L) 07/02/2010 0825   CHOLHDL 5.9 07/02/2010 0825   VLDL 23 07/02/2010 0825   LDLCALC (H) 07/02/2010 0825    113        Total Cholesterol/HDL:CHD Risk Coronary Heart Disease Risk Table                     Men   Women  1/2 Average Risk   3.4   3.3  Average Risk       5.0   4.4  2 X Average Risk   9.6   7.1  3 X Average Risk  23.4   11.0        Use the calculated Patient Ratio above and the CHD Risk  Table to determine the patient's CHD Risk.        ATP III CLASSIFICATION (LDL):  <100     mg/dL   Optimal  100-129  mg/dL   Near or Above                    Optimal  130-159  mg/dL   Borderline  160-189  mg/dL   High  >190     mg/dL   Very High      ASSESSMENT:    1. Atrial fibrillation, unspecified type ([place])   2. Biventricular cardiac pacemaker in situ   3. Chronic systolic congestive heart failure ([place])   4. Coronary artery disease involving native coronary artery of native heart without angina pectoris   5. VT (ventricular tachycardia) - 12/11   6. Dyslipidemia   7. Iron deficiency anemia due to chronic blood loss      PLAN:  In order of problems listed above:  1. AFib: Lengthy episode of persistent atrial fibrillation is interfering with cardiac resynchronization and I think will eventually lead to heart failure exacerbation. Will increase his beta blocker dose, hopefully with better rate control and improved resynchronization pacing, but ultimately I think we need to try to get him back to normal rhythm. Discussed the risk/balance of clot-stroke versus bleeding. We'll schedule him for a T cardioversion and plan to start anticoagulants 48 hours before the procedure, then continue them for a minimum of 30 days following cardioversion. May have to consider long-term use of anticoagulation or refer him for a watchman device. This procedure has been fully reviewed with the patient and written informed consent has been obtained. Amiodarone might be the only option for antiarrhythmic therapy due to his comorbidities and long QT. 2. CRT-P: Poor resynchronization affect due to atrial fibrillation, but otherwise. Relatively  high LV pacing threshold (chronic). Continue remote downloads every 3 months and at least yearly office visits 3. CHF: most recent EF 40-45%, well compensated symptoms, some ankle swelling. Trend for increasing volume/decreasing thoracic impedance by device check out  weight gain. This is consistent with adverse effects of atrial fibrillation and poor resynchronization. He was treated with beta blocker and angiotensin receptor blocker in the past. ARB had to be gradually weaned and eventually stopped due to symptomatic hypotension.  4. CAD s/p CABG and PCI: asymptomatic 5. VT: None recorded in several years   6. HLP: Labs from February 13 show LDL cholesterol 51, total cholesterol 103, triglycerides 97, HDL 36. 7. Anemia : If we cannot continue anticoagulation, consider referral for watchman, although his age may limit the overall benefit of this intervention  Medication Adjustments/Labs and Tests Ordered: Current medicines are reviewed at length with the patient today.  Concerns regarding medicines are outlined above.  Medication changes, Labs and Tests ordered today are listed in the Patient Instructions below. Patient Instructions  Medication Instructions:  START METOPROLOL 100MG  (2TABLETS) IN THE AM AND 50MG  (1TABLET) IN THE PM START ELIQUIS 5MG  TWICE DAILY  Labwork: BEFORE PROCEDURE CBC, TSH AND BMET AT [facility]  Testing/Procedures: TEE CARDIOVERSION WITH DR [id number] ON November 14TH  Follow-Up: Your physician recommends that you schedule a follow-up appointment in: [place]     If you need a refill on your cardiac medications before your next appointment, please call your pharmacy.      Signed, Dakota Klein, MD  05/08/2016 11:25 AM    [address], [place], [place]  [address] Phone: [phone]; Fax: [phone]

## 2016-05-15 ENCOUNTER — Ambulatory Visit (INDEPENDENT_AMBULATORY_CARE_PROVIDER_SITE_OTHER): Payer: Medicare Other | Admitting: *Deleted

## 2016-05-15 ENCOUNTER — Encounter: Payer: Self-pay | Admitting: Cardiovascular Disease

## 2016-05-15 ENCOUNTER — Telehealth: Payer: Self-pay | Admitting: Cardiology

## 2016-05-15 DIAGNOSIS — Z950 Presence of cardiac pacemaker: Secondary | ICD-10-CM

## 2016-05-15 NOTE — Progress Notes (Signed)
Remote pacemaker transmission.   

## 2016-05-15 NOTE — Telephone Encounter (Signed)
Spoke with pt and reminded pt of remote transmission that is due today. Pt verbalized understanding.   

## 2016-05-16 ENCOUNTER — Encounter: Payer: Self-pay | Admitting: Cardiology

## 2016-05-20 ENCOUNTER — Ambulatory Visit (HOSPITAL_COMMUNITY)
Admission: RE | Admit: 2016-05-20 | Discharge: 2016-05-20 | Disposition: A | Discharge status: Home / Self Care | Payer: Medicare Other | Source: Ambulatory Visit | Attending: Cardiovascular Disease | Admitting: Cardiovascular Disease

## 2016-05-20 ENCOUNTER — Encounter (HOSPITAL_COMMUNITY): Payer: Self-pay

## 2016-05-20 ENCOUNTER — Encounter (HOSPITAL_COMMUNITY)
Admission: RE | Disposition: A | Discharge status: Home / Self Care | Payer: Self-pay | Source: Ambulatory Visit | Attending: Cardiovascular Disease

## 2016-05-20 ENCOUNTER — Ambulatory Visit (HOSPITAL_COMMUNITY): Payer: Medicare Other | Admitting: Certified Registered Nurse Anesthetist

## 2016-05-20 ENCOUNTER — Ambulatory Visit (HOSPITAL_BASED_OUTPATIENT_CLINIC_OR_DEPARTMENT_OTHER): Payer: Medicare Other

## 2016-05-20 DIAGNOSIS — Z951 Presence of aortocoronary bypass graft: Secondary | ICD-10-CM | POA: Diagnosis not present

## 2016-05-20 DIAGNOSIS — I255 Ischemic cardiomyopathy: Secondary | ICD-10-CM | POA: Diagnosis not present

## 2016-05-20 DIAGNOSIS — D500 Iron deficiency anemia secondary to blood loss (chronic): Secondary | ICD-10-CM | POA: Diagnosis not present

## 2016-05-20 DIAGNOSIS — Z95810 Presence of automatic (implantable) cardiac defibrillator: Secondary | ICD-10-CM | POA: Insufficient documentation

## 2016-05-20 DIAGNOSIS — Z7902 Long term (current) use of antithrombotics/antiplatelets: Secondary | ICD-10-CM | POA: Insufficient documentation

## 2016-05-20 DIAGNOSIS — Z87891 Personal history of nicotine dependence: Secondary | ICD-10-CM | POA: Insufficient documentation

## 2016-05-20 DIAGNOSIS — I340 Nonrheumatic mitral (valve) insufficiency: Secondary | ICD-10-CM | POA: Diagnosis not present

## 2016-05-20 DIAGNOSIS — Z923 Personal history of irradiation: Secondary | ICD-10-CM | POA: Insufficient documentation

## 2016-05-20 DIAGNOSIS — Z955 Presence of coronary angioplasty implant and graft: Secondary | ICD-10-CM | POA: Diagnosis not present

## 2016-05-20 DIAGNOSIS — I472 Ventricular tachycardia: Secondary | ICD-10-CM | POA: Insufficient documentation

## 2016-05-20 DIAGNOSIS — Z8572 Personal history of non-Hodgkin lymphomas: Secondary | ICD-10-CM | POA: Diagnosis not present

## 2016-05-20 DIAGNOSIS — I480 Paroxysmal atrial fibrillation: Secondary | ICD-10-CM | POA: Diagnosis not present

## 2016-05-20 DIAGNOSIS — Z79899 Other long term (current) drug therapy: Secondary | ICD-10-CM | POA: Diagnosis not present

## 2016-05-20 DIAGNOSIS — I4819 Other persistent atrial fibrillation: Secondary | ICD-10-CM | POA: Diagnosis present

## 2016-05-20 DIAGNOSIS — I2510 Atherosclerotic heart disease of native coronary artery without angina pectoris: Secondary | ICD-10-CM | POA: Diagnosis not present

## 2016-05-20 DIAGNOSIS — I4891 Unspecified atrial fibrillation: Secondary | ICD-10-CM | POA: Diagnosis not present

## 2016-05-20 DIAGNOSIS — I739 Peripheral vascular disease, unspecified: Secondary | ICD-10-CM | POA: Insufficient documentation

## 2016-05-20 DIAGNOSIS — Z823 Family history of stroke: Secondary | ICD-10-CM | POA: Diagnosis not present

## 2016-05-20 DIAGNOSIS — I5042 Chronic combined systolic (congestive) and diastolic (congestive) heart failure: Secondary | ICD-10-CM | POA: Diagnosis not present

## 2016-05-20 DIAGNOSIS — I481 Persistent atrial fibrillation: Secondary | ICD-10-CM

## 2016-05-20 DIAGNOSIS — I447 Left bundle-branch block, unspecified: Secondary | ICD-10-CM | POA: Diagnosis not present

## 2016-05-20 DIAGNOSIS — E785 Hyperlipidemia, unspecified: Secondary | ICD-10-CM | POA: Diagnosis not present

## 2016-05-20 DIAGNOSIS — I110 Hypertensive heart disease with heart failure: Secondary | ICD-10-CM | POA: Diagnosis not present

## 2016-05-20 HISTORY — PX: TEE WITHOUT CARDIOVERSION: SHX5443

## 2016-05-20 HISTORY — PX: CARDIOVERSION: SHX1299

## 2016-05-20 LAB — POCT I-STAT, CHEM 8
BUN: 13 mg/dL (ref 6–20)
Calcium, Ion: 1.13 mmol/L — ABNORMAL LOW (ref 1.15–1.40)
Chloride: 97 mmol/L — ABNORMAL LOW (ref 101–111)
Creatinine, Ser: 0.9 mg/dL (ref 0.61–1.24)
Glucose, Bld: 121 mg/dL — ABNORMAL HIGH (ref 65–99)
HCT: 34 % — ABNORMAL LOW (ref 39.0–52.0)
Hemoglobin: 11.6 g/dL — ABNORMAL LOW (ref 13.0–17.0)
Potassium: 3.1 mmol/L — ABNORMAL LOW (ref 3.5–5.1)
Sodium: 141 mmol/L (ref 135–145)
TCO2: 30 mmol/L (ref 0–100)

## 2016-05-20 SURGERY — CARDIOVERSION
Anesthesia: Monitor Anesthesia Care

## 2016-05-20 MED ORDER — POTASSIUM CHLORIDE CRYS ER 20 MEQ PO TBCR: 40.0000 meq | EXTENDED_RELEASE_TABLET | Freq: Every day | ORAL | Status: DC

## 2016-05-20 MED ORDER — SODIUM CHLORIDE 0.9% FLUSH: 3.0000 mL | INTRAVENOUS | Status: DC | PRN

## 2016-05-20 MED ORDER — SODIUM CHLORIDE 0.9 % IV SOLN: INTRAVENOUS | Status: DC

## 2016-05-20 MED ORDER — SPIRONOLACTONE 25 MG PO TABS: 12.5000 mg | ORAL_TABLET | Freq: Every day | ORAL | 6 refills | Status: DC

## 2016-05-20 MED ORDER — LIDOCAINE HCL (CARDIAC) 20 MG/ML IV SOLN
INTRAVENOUS | Status: DC | PRN
  Administered 2016-05-20: 60 mg via INTRAVENOUS

## 2016-05-20 MED ORDER — PROPOFOL 500 MG/50ML IV EMUL
INTRAVENOUS | Status: DC | PRN
  Administered 2016-05-20: 200 ug/kg/min via INTRAVENOUS

## 2016-05-20 MED ORDER — POTASSIUM CHLORIDE CRYS ER 20 MEQ PO TBCR
40.0000 meq | EXTENDED_RELEASE_TABLET | Freq: Once | ORAL | Status: AC
  Administered 2016-05-20: 40 meq via ORAL
  Filled 2016-05-20: qty 2

## 2016-05-20 MED ORDER — BUTAMBEN-TETRACAINE-BENZOCAINE 2-2-14 % EX AERO
INHALATION_SPRAY | CUTANEOUS | Status: DC | PRN
  Administered 2016-05-20: 2 via TOPICAL

## 2016-05-20 NOTE — Interval H&P Note (Signed)
History and Physical Interval Note:  05/20/2016 12:08 PM  [name]  has presented today for surgery, with the diagnosis of AFIB  The various methods of treatment have been discussed with the patient and family. After consideration of risks, benefits and other options for treatment, the patient has consented to  Procedure(s): CARDIOVERSION (N/A) TRANSESOPHAGEAL ECHOCARDIOGRAM (TEE) (N/A) as a surgical intervention .  The patient's history has been reviewed, patient examined, no change in status, stable for surgery.  I have reviewed the patient's chart and labs.  Questions were answered to the patient's satisfaction.     Dakota Liu

## 2016-05-20 NOTE — Op Note (Signed)
Procedure: Electrical Cardioversion Indications:  Atrial Fibrillation  Procedure Details:  Consent: Risks of procedure as well as the alternatives and risks of each were explained to the (patient/caregiver).  Consent for procedure obtained.  Time Out: Verified patient identification, verified procedure, site/side was marked, verified correct patient position, special equipment/implants available, medications/allergies/relevent history reviewed, required imaging and test results available.  Performed  Patient placed on cardiac monitor, pulse oximetry, supplemental oxygen as necessary.  Sedation given: IV propofol, Dr. Linna Caprice Pacer pads placed anterior and posterior chest.  Cardioverted 1 time(s).  Cardioversion with synchronized biphasic 150J shock.  Evaluation: Findings: Post procedure EKG shows: A sensed BiV paced rhythm Complications: None Patient did tolerate procedure well. CRT-P device check normal after DC cardioversion shock.  Time Spent Directly with the Patient:  30 minutes   Taeden Geller 05/20/2016, 12:30 PM

## 2016-05-20 NOTE — Progress Notes (Signed)
K 3.1 today despite his report of compliance with KCl 40 mEq daily. Will add low dose spironolactone and recheck labs in 2 weeks. Remote pacer download in one week scheduled.

## 2016-05-20 NOTE — Progress Notes (Signed)
*  PRELIMINARY RESULTS* Echocardiogram Echocardiogram Transesophageal has been performed.  Leavy Cella 05/20/2016, 2:17 PM

## 2016-05-20 NOTE — Op Note (Signed)
INDICATIONS: atrial fibrillation  PROCEDURE:   Informed consent was obtained prior to the procedure. The risks, benefits and alternatives for the procedure were discussed and the patient comprehended these risks.  Risks include, but are not limited to, cough, sore throat, vomiting, nausea, somnolence, esophageal and stomach trauma or perforation, bleeding, low blood pressure, aspiration, pneumonia, infection, trauma to the teeth and death.    After a procedural time-out, the oropharynx was anesthetized with 20% benzocaine spray.   During this procedure the patient was administered IV propofol by Dr. Linna Caprice for sedation.  The patient's heart rate, blood pressure, and oxygen saturationweare monitored continuously during the procedure.   The transesophageal probe was inserted in the esophagus and stomach without difficulty and multiple views were obtained.  The patient was kept under observation until the patient left the procedure room.  The patient left the procedure room in stable condition.   Agitated microbubble saline contrast was not administered.  COMPLICATIONS:    There were no immediate complications.  FINDINGS:  No LA thrombus. Good appendage emptying velocities. LVEF 30% with anteroapical scar. 1-2+ MR. Moderate to severe aortic atherosclerosis especially in the descending aorta.  RECOMMENDATIONS:     Proceed with cardioversion.  Time Spent Directly with the Patient:  30 minutes   Dakota Liu 05/20/2016, 12:28 PM

## 2016-05-20 NOTE — Transfer of Care (Signed)
Immediate Anesthesia Transfer of Care Note  Patient: Dakota Liu  Procedure(s) Performed: Procedure(s): CARDIOVERSION (N/A) TRANSESOPHAGEAL ECHOCARDIOGRAM (TEE) (N/A)  Patient Location: Endoscopy Unit  Anesthesia Type:MAC  Level of Consciousness: awake, alert , oriented and patient cooperative  Airway & Oxygen Therapy: Patient Spontanous Breathing and Patient connected to nasal cannula oxygen  Post-op Assessment: Report given to RN, Post -op Vital signs reviewed and stable and Patient moving all extremities  Post vital signs: Reviewed and stable  Last Vitals:  Vitals:   05/20/16 1118  BP: 125/84  Pulse: (!) 117  Resp: 18    Last Pain:  Vitals:   05/20/16 1118  TempSrc: Oral         Complications: No apparent anesthesia complications

## 2016-05-20 NOTE — Anesthesia Preprocedure Evaluation (Signed)
Anesthesia Evaluation  Patient identified by MRN, date of birth, ID band Patient awake    Reviewed: Allergy & Precautions, NPO status , Patient's Chart, lab work & pertinent test results  Airway Mallampati: II  TM Distance: >3 FB Neck ROM: Full    Dental  (+) Edentulous Upper, Partial Lower, Dental Advisory Given   Pulmonary former smoker,    breath sounds clear to auscultation       Cardiovascular hypertension,  Rhythm:Irregular     Neuro/Psych    GI/Hepatic   Endo/Other    Renal/GU      Musculoskeletal   Abdominal   Peds  Hematology   Anesthesia Other Findings   Reproductive/Obstetrics                             Anesthesia Physical Anesthesia Plan  ASA: III  Anesthesia Plan: General   Post-op Pain Management:    Induction: Intravenous  Airway Management Planned: Mask  Additional Equipment:   Intra-op Plan:   Post-operative Plan:   Informed Consent: I have reviewed the patients History and Physical, chart, labs and discussed the procedure including the risks, benefits and alternatives for the proposed anesthesia with the patient or authorized representative who has indicated his/her understanding and acceptance.   Dental advisory given  Plan Discussed with: CRNA and Anesthesiologist  Anesthesia Plan Comments:         Anesthesia Quick Evaluation

## 2016-05-20 NOTE — H&P (View-Only) (Signed)
Cardiology Office Note    Date:  05/08/2016   ID:  Dakota Liu, DOB 07-13-1927, MRN [id number]  PCP:  Woody Seller, MD  Cardiologist:   Sanda Klein, MD   Chief Complaint  Patient presents with  . Follow-up    History of Present Illness:  Dakota Liu is a 82 y.o. male with a long-standing history of ischemic cardiomyopathy, well controlled systolic and diastolic heart failure, coronary artery disease with previous bypass surgery and percutaneous revascularization, PAD, paroxysmal atrial fibrillation and history of ventricular tachycardia.   About 6 months ago he underwent generator change out with downgrade from CRT-D to CRT-P (due to advanced age, improved LVEF and absence of VT/VF therapies).   Roughly 3 weeks ago routine device check showed new onset persistent atrial fibrillation with rapid ventricular response, associated with a marked reduction in biventricular pacing and a trend towards worsening volume status by Optivol. Previous episodes of atrial fibrillation had always been very brief and we had elected not to treat with anticoagulation due to unsteady gait and risk for falls as well as iron deficiency anemia. He has not noticed any reduction in his exercise tolerance, shortness of breath, edema or angina pectoris. He denies palpitations or any other irregularities and is beat. He has not had any change in minimal ankle edema. His weight is unchanged. He does describe recent enlargement of his left breast without any discrete masses.  He reports that he has not had any recent bleeding, falls or injuries.   He had CABG in '88 with subsequent PCI 12/11 (distal RCA stent), 3/12 ([place] angioplasty for in-stent restenosis). Last cardiac cath in October 2014 showed severe native vessel disease (70% left main, 95% ostial LAD occluded proximally, all OM branch is occluded, right coronary artery occluded proximally) but with patent grafts (LIMA to the LAD, SVG to  diagonal, sequential SVG to OM1 and-1 to-13, SVG to RCA) and patent site of previous stent in the distal RCA.  He had moderate to severe ischemic cardiopathy with estimated LVEF of 30% and akinesis of the mid to distal anterior wall and mid to distal inferior wall and apical dyskinesis. He received a CRT-D [employer] device in December 2011 with good response and an increase in LVEF to 40-45%.  He has treated hypertension and hyperlipidemia and a previous history of lymphoma and iron deficiency anemia. He wears braces on his ankles for unsteady joints.  His pacemaker has recorded occasional atrial fibrillation but he is not receiving warfarin secondary to unsteady gait and falls. No history of stroke/TIA or other embolic events  Past Medical History:  Diagnosis Date  . Anemia, iron deficiency 11/21/2011  . CAD (coronary artery disease) '88, 12/11, 3/12   CABG'88, PCI 12/1, 3/12  . CHF (congestive heart failure) ([place])   . Dyslipidemia   . Epididymitis   . HTN (hypertension)   . Ischemic cardiomyopathy 10/14   EF improved to 40-45% after BiV ICD  . LBBB (left bundle branch block)   . Lymphoma ([place])    with metal radiation about 2006  . PAF (paroxysmal atrial fibrillation) ([place]) 12/11  . PVD (peripheral vascular disease) ([place]) 11/10   AAA R&G  . VT (ventricular tachycardia) ([place]) 12/11    Past Surgical History:  Procedure Laterality Date  . ABDOMINAL AORTIC ANEURYSM REPAIR  11/10  . CARDIAC DEFIBRILLATOR PLACEMENT  12/11  . CORONARY ANGIOPLASTY WITH STENT PLACEMENT  07/05/10, 3/12   RCA with ISR 3/12  . CORONARY ARTERY BYPASS  GRAFT  1988  . EP IMPLANTABLE DEVICE N/A 10/09/2015   Procedure: ICD to [place];  Surgeon: Sanda Klein, MD;  Location: [facility];  Service: Cardiovascular;  Laterality: N/A;  . LEFT HEART CATHETERIZATION WITH CORONARY/GRAFT ANGIOGRAM N/A 05/03/2013   Procedure: LEFT HEART CATHETERIZATION WITH Beatrix Fetters;  Surgeon: Peter M  Martinique, MD;  [facility] CATH LAB;  Service: Cardiovascular;  Laterality: N/A;  . ORCHIECTOMY    . TONSILLECTOMY      Current Medications: Outpatient Medications Prior to Visit  Medication Sig Dispense Refill  . clopidogrel (PLAVIX) 75 MG tablet Take 1 tablet (75 mg total) by mouth daily. 30 tablet 0  . furosemide (LASIX) 20 MG tablet Take 3 tablets (60 mg total) by mouth daily. 90 tablet 11  . iron polysaccharides (NU-IRON) 150 MG capsule Take 1 capsule (150 mg total) by mouth 2 (two) times daily. 60 capsule 0  . KLOR-CON M20 20 MEQ tablet TAKE 1 TABLET BY MOUTH EVERY DAY (Patient taking differently: TAKE 2 TABLETS BY MOUTH EVERY DAY) 90 tablet 2  . nitroGLYCERIN (NITROSTAT) 0.4 MG SL tablet Place 0.4 mg under the tongue every 5 (five) minutes as needed.      . pantoprazole (PROTONIX) 40 MG tablet Take 1 tablet (40 mg total) by mouth 2 (two) times daily. 60 tablet 0  . ranolazine (RANEXA) 500 MG 12 hr tablet Take 1 tablet (500 mg total) by mouth 2 (two) times daily. 60 tablet 0  . simvastatin (ZOCOR) 20 MG tablet Take 1 tablet (20 mg total) by mouth daily. 30 tablet 11  . metoprolol succinate (TOPROL-XL) 50 MG 24 hr tablet Take 50 mg by mouth 2 (two) times daily.   5   No facility-administered medications prior to visit.      Allergies:   Tramadol; Ace inhibitors; Codeine; and Simvastatin   Social History   Social History  . Marital status: Married    Spouse name: N/A  . Number of children: N/A  . Years of education: N/A   Social History Main Topics  . Smoking status: Former Smoker    Packs/day: 1.00    Years: 44.00    Types: Cigarettes    Quit date: 07/07/1984  . Smokeless tobacco: Never Used  . Alcohol use No  . Drug use: No  . Sexual activity: Not Asked   Other Topics Concern  . None   Social History Narrative  . None     Family History:  The patient's family history includes Cancer in his sister; Diabetes in his mother; Heart Problems in his mother; Stroke in his  father.   ROS:   Please see the history of present illness.    ROS All other systems reviewed and are negative.   PHYSICAL EXAM:   VS:  BP 112/68   Pulse 95   Ht 6\' 1"  (1.854 m)   Wt 173 lb (78.5 kg)   BMI 22.82 kg/m    GEN: Well nourished, well developed, in no acute distress  HEENT: normal  Neck: no JVD, carotid bruits, or masses Cardiac: irregular, no murmurs, rubs, or gallops, 1+ right ankle and low pretibial edema (SVG harvest site), minimal left pretibial and ankle edema  Respiratory:  clear to auscultation bilaterally, normal work of breathing GI: soft, nontender, nondistended, + BS MS: no deformity or atrophy  Skin: warm and dry, no rash Neuro:  Alert and Oriented x 3, Strength and sensation are intact Psych: euthymic mood, full affect  Wt  Readings from Last 3 Encounters:  05/07/16 173 lb (78.5 kg)  01/22/16 177 lb (80.3 kg)  10/09/15 177 lb (80.3 kg)      Studies/Labs Reviewed:   EKG:  EKG is ordered today.  Shows atrial fibrillation. Although there is triggered ventricular pacing there is only pseudofusion without any true resynchronization. Broad QRS 134 ms, QTC 532 ms.  Recent Labs: 08/22/2015: ALT 8; Magnesium 1.9 09/26/2015: BUN 18; Creat 1.01; Hemoglobin 9.9; Platelets 196; Potassium 3.9; Sodium 137; TSH 2.18   Lipid Panel    Component Value Date/Time   CHOL  07/02/2010 0825    164        ATP III CLASSIFICATION:  <200     mg/dL   Desirable  200-239  mg/dL   Borderline High  >=240    mg/dL   High          TRIG 115 07/02/2010 0825   HDL 28 (L) 07/02/2010 0825   CHOLHDL 5.9 07/02/2010 0825   VLDL 23 07/02/2010 0825   LDLCALC (H) 07/02/2010 0825    113        Total Cholesterol/HDL:CHD Risk Coronary Heart Disease Risk Table                     Men   Women  1/2 Average Risk   3.4   3.3  Average Risk       5.0   4.4  2 X Average Risk   9.6   7.1  3 X Average Risk  23.4   11.0        Use the calculated Patient Ratio above and the CHD Risk  Table to determine the patient's CHD Risk.        ATP III CLASSIFICATION (LDL):  <100     mg/dL   Optimal  100-129  mg/dL   Near or Above                    Optimal  130-159  mg/dL   Borderline  160-189  mg/dL   High  >190     mg/dL   Very High      ASSESSMENT:    1. Atrial fibrillation, unspecified type ([place])   2. Biventricular cardiac pacemaker in situ   3. Chronic systolic congestive heart failure ([place])   4. Coronary artery disease involving native coronary artery of native heart without angina pectoris   5. VT (ventricular tachycardia) - 12/11   6. Dyslipidemia   7. Iron deficiency anemia due to chronic blood loss      PLAN:  In order of problems listed above:  1. AFib: Lengthy episode of persistent atrial fibrillation is interfering with cardiac resynchronization and I think will eventually lead to heart failure exacerbation. Will increase his beta blocker dose, hopefully with better rate control and improved resynchronization pacing, but ultimately I think we need to try to get him back to normal rhythm. Discussed the risk/balance of clot-stroke versus bleeding. We'll schedule him for a T cardioversion and plan to start anticoagulants 48 hours before the procedure, then continue them for a minimum of 30 days following cardioversion. May have to consider long-term use of anticoagulation or refer him for a watchman device. This procedure has been fully reviewed with the patient and written informed consent has been obtained. Amiodarone might be the only option for antiarrhythmic therapy due to his comorbidities and long QT. 2. CRT-P: Poor resynchronization affect due to atrial fibrillation, but otherwise. Relatively  high LV pacing threshold (chronic). Continue remote downloads every 3 months and at least yearly office visits 3. CHF: most recent EF 40-45%, well compensated symptoms, some ankle swelling. Trend for increasing volume/decreasing thoracic impedance by device check out  weight gain. This is consistent with adverse effects of atrial fibrillation and poor resynchronization. He was treated with beta blocker and angiotensin receptor blocker in the past. ARB had to be gradually weaned and eventually stopped due to symptomatic hypotension.  4. CAD s/p CABG and PCI: asymptomatic 5. VT: None recorded in several years   6. HLP: Labs from February 13 show LDL cholesterol 51, total cholesterol 103, triglycerides 97, HDL 36. 7. Anemia : If we cannot continue anticoagulation, consider referral for watchman, although his age may limit the overall benefit of this intervention  Medication Adjustments/Labs and Tests Ordered: Current medicines are reviewed at length with the patient today.  Concerns regarding medicines are outlined above.  Medication changes, Labs and Tests ordered today are listed in the Patient Instructions below. Patient Instructions  Medication Instructions:  START METOPROLOL 100MG  (2TABLETS) IN THE AM AND 50MG  (1TABLET) IN THE PM START ELIQUIS 5MG  TWICE DAILY  Labwork: BEFORE PROCEDURE CBC, TSH AND BMET AT [facility]  Testing/Procedures: TEE CARDIOVERSION WITH DR NO:[id number] ON November 14TH  Follow-Up: Your physician recommends that you schedule a follow-up appointment in: [place]     If you need a refill on your cardiac medications before your next appointment, please call your pharmacy.      Signed, Sanda Klein, MD  05/08/2016 11:25 AM    [address], [place], [place]  [address] Phone: [phone]; Fax: [phone]

## 2016-05-20 NOTE — Discharge Instructions (Signed)
TEE ° °YOU HAD AN CARDIAC PROCEDURE TODAY: Refer to the procedure report and other information in the discharge instructions given to you for any specific questions about what was found during the examination. If this information does not answer your questions, please call [facility] office at [phone] to clarify.  ° °DIET: Your first meal following the procedure should be a light meal and then it is ok to progress to your normal diet. A half-sandwich or bowl of soup is an example of a good first meal. Heavy or fried foods are harder to digest and may make you feel nauseous or bloated. Drink plenty of fluids but you should avoid alcoholic beverages for 24 hours. If you had a esophageal dilation, please see attached instructions for diet.  ° °ACTIVITY: Your care partner should take you home directly after the procedure. You should plan to take it easy, moving slowly for the rest of the day. You can resume normal activity the day after the procedure however YOU SHOULD NOT DRIVE, use power tools, machinery or perform tasks that involve climbing or major physical exertion for 24 hours (because of the sedation medicines used during the test).  ° °SYMPTOMS TO REPORT IMMEDIATELY: °A cardiologist can be reached at any hour. Please call [phone] for any of the following symptoms:  °Vomiting of blood or coffee ground material  °New, significant abdominal pain  °New, significant chest pain or pain under the shoulder blades  °Painful or persistently difficult swallowing  °New shortness of breath  °Black, tarry-looking or red, bloody stools ° °FOLLOW UP:  °Please also call with any specific questions about appointments or follow up tests. ° ° °Electrical Cardioversion, Care After °This sheet gives you information about how to care for yourself after your procedure. Your health care provider may also give you more specific instructions. If you have problems or questions, contact your health care provider. °What can I  expect after the procedure? °After the procedure, it is common to have: °· Some redness on the skin where the shocks were given. °Follow these instructions at home: °· Do not drive for 24 hours if you were given a medicine to help you relax (sedative). °· Take over-the-counter and prescription medicines only as told by your health care provider. °· Ask your health care provider how to check your pulse. Check it often. °· Rest for 48 hours after the procedure or as told by your health care provider. °· Avoid or limit your caffeine use as told by your health care provider. °Contact a health care provider if: °· You feel like your heart is beating too quickly or your pulse is not regular. °· You have a serious muscle cramp that does not go away. °Get help right away if: °· You have discomfort in your chest. °· You are dizzy or you feel faint. °· You have trouble breathing or you are short of breath. °· Your speech is slurred. °· You have trouble moving an arm or leg on one side of your body. °· Your fingers or toes turn cold or blue. °This information is not intended to replace advice given to you by your health care provider. Make sure you discuss any questions you have with your health care provider. °Document Released: 04/13/2013 Document Revised: 01/25/2016 Document Reviewed: 12/28/2015 °Elsevier Interactive Patient Education © 2017 [employer] ° °

## 2016-05-20 NOTE — Anesthesia Postprocedure Evaluation (Signed)
Anesthesia Post Note  Patient: Dakota Liu  Procedure(s) Performed: Procedure(s) (LRB): CARDIOVERSION (N/A) TRANSESOPHAGEAL ECHOCARDIOGRAM (TEE) (N/A)  Patient location during evaluation: Endoscopy Anesthesia Type: General Level of consciousness: awake, awake and alert and oriented Pain management: pain level controlled Vital Signs Assessment: post-procedure vital signs reviewed and stable Respiratory status: spontaneous breathing, nonlabored ventilation and respiratory function stable Cardiovascular status: blood pressure returned to baseline Anesthetic complications: no    Last Vitals:  Vitals:   05/20/16 1250 05/20/16 1300  BP: 132/70 136/71  Pulse: 92 84  Resp: (!) 22 18  Temp:      Last Pain:  Vitals:   05/20/16 1238  TempSrc: Oral                 Olie Dibert COKER

## 2016-05-21 ENCOUNTER — Encounter (HOSPITAL_COMMUNITY): Payer: Self-pay | Admitting: Cardiovascular Disease

## 2016-05-22 ENCOUNTER — Encounter (HOSPITAL_COMMUNITY): Payer: Self-pay | Admitting: Emergency Medicine

## 2016-05-22 ENCOUNTER — Telehealth: Payer: Self-pay | Admitting: Cardiovascular Disease

## 2016-05-22 ENCOUNTER — Emergency Department (HOSPITAL_COMMUNITY)
Admission: EM | Admit: 2016-05-22 | Discharge: 2016-05-22 | Disposition: A | Discharge status: Home / Self Care | Payer: Medicare Other | Attending: Emergency Medicine | Admitting: Emergency Medicine

## 2016-05-22 ENCOUNTER — Emergency Department (HOSPITAL_COMMUNITY): Payer: Medicare Other

## 2016-05-22 DIAGNOSIS — I110 Hypertensive heart disease with heart failure: Secondary | ICD-10-CM | POA: Insufficient documentation

## 2016-05-22 DIAGNOSIS — Z955 Presence of coronary angioplasty implant and graft: Secondary | ICD-10-CM | POA: Insufficient documentation

## 2016-05-22 DIAGNOSIS — Z87891 Personal history of nicotine dependence: Secondary | ICD-10-CM | POA: Diagnosis not present

## 2016-05-22 DIAGNOSIS — I2510 Atherosclerotic heart disease of native coronary artery without angina pectoris: Secondary | ICD-10-CM | POA: Diagnosis not present

## 2016-05-22 DIAGNOSIS — Z951 Presence of aortocoronary bypass graft: Secondary | ICD-10-CM | POA: Insufficient documentation

## 2016-05-22 DIAGNOSIS — R0602 Shortness of breath: Secondary | ICD-10-CM | POA: Diagnosis present

## 2016-05-22 DIAGNOSIS — I5022 Chronic systolic (congestive) heart failure: Secondary | ICD-10-CM | POA: Diagnosis not present

## 2016-05-22 DIAGNOSIS — Z8572 Personal history of non-Hodgkin lymphomas: Secondary | ICD-10-CM | POA: Diagnosis not present

## 2016-05-22 DIAGNOSIS — Z95810 Presence of automatic (implantable) cardiac defibrillator: Secondary | ICD-10-CM | POA: Diagnosis not present

## 2016-05-22 DIAGNOSIS — Z7901 Long term (current) use of anticoagulants: Secondary | ICD-10-CM | POA: Diagnosis not present

## 2016-05-22 LAB — CBC
HEMATOCRIT: 32.3 % — AB (ref 39.0–52.0)
HEMOGLOBIN: 9.5 g/dL — AB (ref 13.0–17.0)
MCH: 24.7 pg — ABNORMAL LOW (ref 26.0–34.0)
MCHC: 29.4 g/dL — ABNORMAL LOW (ref 30.0–36.0)
MCV: 84.1 fL (ref 78.0–100.0)
Platelets: 179 10*3/uL (ref 150–400)
RBC: 3.84 MIL/uL — AB (ref 4.22–5.81)
RDW: 19.4 % — AB (ref 11.5–15.5)
WBC: 7.3 10*3/uL (ref 4.0–10.5)

## 2016-05-22 LAB — BASIC METABOLIC PANEL
ANION GAP: 8 (ref 5–15)
BUN: 14 mg/dL (ref 6–20)
CHLORIDE: 101 mmol/L (ref 101–111)
CO2: 30 mmol/L (ref 22–32)
Calcium: 9 mg/dL (ref 8.9–10.3)
Creatinine, Ser: 0.98 mg/dL (ref 0.61–1.24)
Glucose, Bld: 119 mg/dL — ABNORMAL HIGH (ref 65–99)
POTASSIUM: 3.5 mmol/L (ref 3.5–5.1)
SODIUM: 139 mmol/L (ref 135–145)

## 2016-05-22 LAB — I-STAT TROPONIN, ED
TROPONIN I, POC: 0 ng/mL (ref 0.00–0.08)
Troponin i, poc: 0.01 ng/mL (ref 0.00–0.08)

## 2016-05-22 LAB — BRAIN NATRIURETIC PEPTIDE: B NATRIURETIC PEPTIDE 5: 273.1 pg/mL — AB (ref 0.0–100.0)

## 2016-05-22 NOTE — Telephone Encounter (Signed)
Thanks

## 2016-05-22 NOTE — ED Provider Notes (Signed)
[facility] DEPT Provider Note   CSN: [id number] Arrival date & time: 05/22/16  1032     History   Chief Complaint Chief Complaint  Patient presents with  . Shortness of Breath    HPI CADDEN DETLOFF is a 82 y.o. male.  The history is provided by the patient.  Shortness of Breath  This is a recurrent problem. The average episode lasts 10 minutes. The problem occurs frequently.The current episode started 12 to 24 hours ago. The problem has been resolved. Associated symptoms include rhinorrhea, cough and sputum production. Pertinent negatives include no fever and no chest pain. Treatments tried: NTG. The treatment provided significant relief. Associated medical issues include CAD, heart failure and past MI (s/p 6 bypasses).   S/p cardioversion on 11/14 for Afib.   Past Medical History:  Diagnosis Date  . Anemia, iron deficiency 11/21/2011  . CAD (coronary artery disease) '88, 12/11, 3/12   CABG'88, PCI 12/1, 3/12  . CHF (congestive heart failure) ([place])   . Dyslipidemia   . Epididymitis   . HTN (hypertension)   . Ischemic cardiomyopathy 10/14   EF improved to 40-45% after BiV ICD  . LBBB (left bundle branch block)   . Lymphoma ([place])    with metal radiation about 2006  . PAF (paroxysmal atrial fibrillation) ([place]) 12/11  . PVD (peripheral vascular disease) ([place]) 11/10   AAA R&G  . VT (ventricular tachycardia) ([place]) 12/11    Patient Active Problem List   Diagnosis Date Noted  . Persistent atrial fibrillation ([place]) 05/07/2016  . Biventricular cardiac pacemaker in situ 01/24/2016  . Symptomatic anemia 08/21/2015  . UTI (urinary tract infection) 05/31/2013  . CAD - CABG '88, RCA Stent 12/11, ISR rx'd with HSRA 3/12; patent grafts on cath 10/14 05/03/2013  . LBBB (left bundle branch block) 05/03/2013  . PVD (peripheral vascular disease)- AAA R&G Nov 2010 05/03/2013  . Dyslipidemia 05/03/2013  . VT (ventricular tachycardia) - 12/11 05/03/2013  . Generalized weakness  01/17/2013  . Dark urine 01/17/2013  . Effects of radiation, unspecified 06/07/2012  . 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 Anemia, iron deficiency 11/21/2011  . Ischemic cardiomyopathy- EF improved to 40-45% 10/14 10/10/2010  . Chronic systolic congestive heart failure ([place]) 10/10/2010  . PAF - 12/11- (no Coumadin secondary to unsteady gait) 10/10/2010    Past Surgical History:  Procedure Laterality Date  . ABDOMINAL AORTIC ANEURYSM REPAIR  11/10  . CARDIAC DEFIBRILLATOR PLACEMENT  12/11  . CARDIOVERSION N/A 05/20/2016   Procedure: CARDIOVERSION;  Surgeon: Sanda Klein, MD;  Location: [facility] ENDOSCOPY;  Service: Cardiovascular;  Laterality: N/A;  . CORONARY ANGIOPLASTY WITH STENT PLACEMENT  07/05/10, 3/12   RCA with ISR 3/12  . CORONARY ARTERY BYPASS GRAFT  1988  . EP IMPLANTABLE DEVICE N/A 10/09/2015   Procedure: ICD to [place];  Surgeon: Sanda Klein, MD;  Location: [facility] CV LAB;  Service: Cardiovascular;  Laterality: N/A;  . LEFT HEART CATHETERIZATION WITH CORONARY/GRAFT ANGIOGRAM N/A 05/03/2013   Procedure: LEFT HEART CATHETERIZATION WITH Beatrix Fetters;  Surgeon: Peter M Martinique, MD;  Location: [facility] CATH LAB;  Service: Cardiovascular;  Laterality: N/A;  . ORCHIECTOMY    . TEE WITHOUT CARDIOVERSION N/A 05/20/2016   Procedure: TRANSESOPHAGEAL ECHOCARDIOGRAM (TEE);  Surgeon: Sanda Klein, MD;  Location: [facility] ENDOSCOPY;  Service: Cardiovascular;  Laterality: N/A;  . TONSILLECTOMY         Home Medications    Prior to Admission medications   Medication Sig Start Date  End Date Taking? Authorizing Provider  apixaban (ELIQUIS) 5 MG TABS tablet Take 1 tablet (5 mg total) by mouth 2 (two) times daily. 05/07/16  Yes Mihai Croitoru, MD  clopidogrel (PLAVIX) 75 MG tablet Take 1 tablet (75 mg total) by mouth daily. 09/05/15  Yes Reyne Dumas, MD  furosemide (LASIX) 20 MG tablet Take 3 tablets (60 mg total) by mouth daily. 01/22/16  Yes  Mihai Croitoru, MD  iron polysaccharides (NU-IRON) 150 MG capsule Take 1 capsule (150 mg total) by mouth 2 (two) times daily. 08/22/15  Yes Reyne Dumas, MD  Menthol, Topical Analgesic, (BENGAY EX) Apply 1 application topically as needed (for pain).   Yes Historical Provider, MD  metoprolol succinate (TOPROL-XL) 50 MG 24 hr tablet 2 TABLETS (100MG ) IN THE AM AND 1 TABLET (50MG ) IN THE PM Patient taking differently: Take 50-100 mg by mouth See admin instructions. Take 100 mg by mouth in the morning and take 50 mg by mouth in the evening. 05/07/16  Yes Mihai Croitoru, MD  nitroGLYCERIN (NITROSTAT) 0.4 MG SL tablet Place 0.4 mg under the tongue every 5 (five) minutes as needed.     Yes Historical Provider, MD  pantoprazole (PROTONIX) 40 MG tablet Take 1 tablet (40 mg total) by mouth 2 (two) times daily. 08/22/15  Yes Reyne Dumas, MD  potassium chloride SA (KLOR-CON M20) 20 MEQ tablet Take 2 tablets (40 mEq total) by mouth daily. 05/20/16  Yes Mihai Croitoru, MD  ranolazine (RANEXA) 500 MG 12 hr tablet Take 1 tablet (500 mg total) by mouth 2 (two) times daily. 05/04/13  Yes Cherene Altes, MD  simvastatin (ZOCOR) 20 MG tablet Take 1 tablet (20 mg total) by mouth daily. 01/22/16  Yes Mihai Croitoru, MD  spironolactone (ALDACTONE) 25 MG tablet Take 0.5 tablets (12.5 mg total) by mouth daily. 05/20/16  Yes Sanda Klein, MD    Family History Family History  Problem Relation Age of Onset  . Diabetes Mother   . Heart Problems Mother   . Stroke Father   . Cancer Sister     Social History Social History  Substance Use Topics  . Smoking status: Former Smoker    Packs/day: 1.00    Years: 44.00    Types: Cigarettes    Quit date: 07/07/1984  . Smokeless tobacco: Never Used  . Alcohol use No     Allergies   Tramadol; Ace inhibitors; Codeine; and Simvastatin   Review of Systems Review of Systems  Constitutional: Negative for fever.  HENT: Positive for rhinorrhea.   Respiratory: Positive for  cough, sputum production and shortness of breath.   Cardiovascular: Negative for chest pain.   Ten systems are reviewed and are negative for acute change except as noted in the HPI   Physical Exam Updated Vital Signs BP 152/83 (BP Location: Right Arm)   Pulse 69   Temp 97.7 F (36.5 C) (Oral)   Resp 21   Ht 6\' 2"  (1.88 m)   Wt 173 lb (78.5 kg)   SpO2 97%   BMI 22.21 kg/m   Physical Exam  Constitutional: He is oriented to person, place, and time. He appears well-developed and well-nourished. No distress.  HENT:  Head: Normocephalic and atraumatic.  Nose: Nose normal.  Eyes: Conjunctivae and EOM are normal. Pupils are equal, round, and reactive to light. Right eye exhibits no discharge. Left eye exhibits no discharge. No scleral icterus.  Neck: Normal range of motion. Neck supple.  Cardiovascular: Normal rate and regular rhythm.  Exam reveals  no gallop and no friction rub.   No murmur heard. Pulmonary/Chest: Effort normal and breath sounds normal. No stridor. No respiratory distress. He has no rales.  Abdominal: Soft. He exhibits no distension. There is no tenderness.  Musculoskeletal: He exhibits no edema or tenderness.  Neurological: He is alert and oriented to person, place, and time.  Skin: Skin is warm and dry. No rash noted. He is not diaphoretic. No erythema.  Psychiatric: He has a normal mood and affect.  Vitals reviewed.    ED Treatments / Results  Labs (all labs ordered are listed, but only abnormal results are displayed) Labs Reviewed  BASIC METABOLIC PANEL - Abnormal; Notable for the following:       Result Value   Glucose, Bld 119 (*)    All other components within normal limits  CBC - Abnormal; Notable for the following:    RBC 3.84 (*)    Hemoglobin 9.5 (*)    HCT 32.3 (*)    MCH 24.7 (*)    MCHC 29.4 (*)    RDW 19.4 (*)    All other components within normal limits  BRAIN NATRIURETIC PEPTIDE - Abnormal; Notable for the following:    B Natriuretic  Peptide 273.1 (*)    All other components within normal limits  I-STAT TROPOININ, ED  I-STAT TROPOININ, ED    EKG  EKG Interpretation  Date/Time:  Thursday May 22 2016 10:41:33 EST Ventricular Rate:  71 PR Interval:  126 QRS Duration: 162 QT Interval:  526 QTC Calculation: 571 R Axis:   -38 Text Interpretation:  Atrial-sensed ventricular-paced rhythm Abnormal ECG No significant change since last tracing Confirmed by [place] ([id number]) on 05/22/2016 12:59:52 PM       Radiology Dg Chest 2 View  Result Date: 05/22/2016 CLINICAL DATA:  Preoperative examination. EXAM: CHEST  2 VIEW COMPARISON:  PA and lateral chest 05/01/2013. FINDINGS: AICD is in place, unchanged. Linear atelectasis or scar in the left lung base is noted. The right lung is clear. Heart size is enlarged. Aortic atherosclerosis is noted. Hiatal hernia is again seen. IMPRESSION: No acute disease. Atherosclerosis. Hiatal hernia. Electronically Signed   By: Inge Rise M.D.   On: 05/22/2016 11:15    Procedures Procedures (including critical care time)  Medications Ordered in ED Medications - No data to display   Initial Impression / Assessment and Plan / ED Course  I have reviewed the triage vital signs and the nursing notes.  Pertinent labs & imaging results that were available during my care of the patient were reviewed by me and considered in my medical decision making (see chart for details).  Clinical Course     Exam not consistent with volume overload; low suspicion for ingestion of heart failure which was confirmed by BNP.  EKG without acute ischemic changes or evidence of dysrhythmia. Chest x-ray without evidence suggestive of pneumonia, pneumothorax, pneumomediastinum.  No abnormal contour of the mediastinum to suggest dissection. No evidence of acute injuries. Initial troponin negative. Given the patient's significant cardiac history I had a discussion with him and the wife regarding  possible admission for continued trending of troponins to rule out ACS versus obtaining a second troponin at approximately the 12 hour mark from symptom onset. With shared decision making patient decided to forego admission and obtain a second troponin. I feel that the patient's symptoms are likely anginal symptoms given the resolution with nitroglycerin, and delta troponin would be appropriate to rule out ACS, since the patient  has been asymptomatic for approximately 10 hours.  Second troponin was negative.  Low suspicion for pulmonary embolism.  Patient was discharged home with strict return precautions.  Final Clinical Impressions(s) / ED Diagnoses   Final diagnoses:  SOB (shortness of breath)    New Prescriptions Discharge Medication List as of 05/22/2016  3:55 PM       Fatima Blank, MD 05/22/16 [phone]

## 2016-05-22 NOTE — Telephone Encounter (Signed)
Pt heart was shocked on Tuesday,now he keeps feeling like he is smothering.Please call to advise.

## 2016-05-22 NOTE — ED Notes (Signed)
Pt resting in bed; A&O x3. No needs at this time.

## 2016-05-22 NOTE — ED Notes (Signed)
Pt in gown, placed on monitor. Urine is bedside in case of sample being ordered.

## 2016-05-22 NOTE — ED Triage Notes (Signed)
Pt states he started feeling SOB this morning. When he walks he feels like he will pass out. He states that on Tuesday they "went down my throat and shocked my heart back in rhythm." Denies CP

## 2016-05-22 NOTE — Telephone Encounter (Signed)
Spoke to wife. Patient cardioverted at [facility] 2 days ago. He felt fine that day, went home. Yesterday afternoon, he had a sudden and very noticeable onset of "smothering", chest heaviness and shortness of breath which then went away. He had a repeat episode of indentical symptoms this AM, and this was relieved with his "glycerin" tablet but with lingering SOB and chest heaviness.  I advised an ER evaluation and the patient and wife are agreeable to this. They are traveling to [facility] from home. I have notified Trish.

## 2016-05-22 NOTE — ED Notes (Signed)
Pt currently giving urine sample. Wife at bedside.

## 2016-05-26 ENCOUNTER — Ambulatory Visit (INDEPENDENT_AMBULATORY_CARE_PROVIDER_SITE_OTHER): Payer: Medicare Other

## 2016-05-26 ENCOUNTER — Telehealth: Payer: Self-pay | Admitting: Cardiology

## 2016-05-26 DIAGNOSIS — Z950 Presence of cardiac pacemaker: Secondary | ICD-10-CM

## 2016-05-26 DIAGNOSIS — I5022 Chronic systolic (congestive) heart failure: Secondary | ICD-10-CM | POA: Diagnosis not present

## 2016-05-26 NOTE — Telephone Encounter (Signed)
Spoke with pt and reminded pt of remote transmission that is due today. Pt verbalized understanding.   

## 2016-05-27 ENCOUNTER — Telehealth: Payer: Self-pay

## 2016-05-27 NOTE — Telephone Encounter (Signed)
Remote ICM transmission received.  Attempted patient call and left message to return call.   

## 2016-05-27 NOTE — Progress Notes (Signed)
EPIC Encounter for ICM Monitoring  Patient Name: Dakota Liu is a 82 y.o. male Date: 05/27/2016 Primary Care Physican: Woody Seller, MD Primary Cardiologist: Croitoru Electrophysiologist: Croitoru Dry Weight:  unknown Bi-V Pacing:  95.3%   Clinical Status (20-May-2016 to 26-May-2016) AT/AF 1 episode  Time in AT/AF <0.1 hr/day (0.1%)  Observations (2) (20-May-2016 to 26-May-2016) AT/AF Daily Burden > Threshold  ?Fast V Rate During AT/AF  ?11 V. Sensing Episodes  ?11 minutes in AT/AF Since Last Session       Attempted ICM call and unable to reach.  Transmission reviewed.   Thoracic impedance abnormal suggesting fluid accumulation but is starting to trend toward baseline.  Recommendations: NONE - Unable to reach  Follow-up plan: ICM clinic phone appointment on 06/02/2016.  Copy of ICM check sent to primary cardiologist and device physician.   ICM trend: 05/26/2016       Rosalene Billings, RN 05/27/2016 9:23 AM

## 2016-05-27 NOTE — Progress Notes (Signed)
Maintaining normal rhythm after cardioversion, followed by reduction in ventricular rate and improvement in BiV pacing efficacy, in turn with evidence for improving thoracic impedance/fluid status.

## 2016-05-28 ENCOUNTER — Telehealth: Payer: Self-pay

## 2016-05-28 NOTE — Telephone Encounter (Signed)
Prior authorization for Eliquis faxed to patient's insurance company via [email or web address] Awaiting approval. 

## 2016-06-02 ENCOUNTER — Telehealth: Payer: Self-pay | Admitting: Cardiology

## 2016-06-02 ENCOUNTER — Ambulatory Visit (INDEPENDENT_AMBULATORY_CARE_PROVIDER_SITE_OTHER): Payer: Medicare Other

## 2016-06-02 ENCOUNTER — Telehealth: Payer: Self-pay

## 2016-06-02 DIAGNOSIS — Z950 Presence of cardiac pacemaker: Secondary | ICD-10-CM

## 2016-06-02 DIAGNOSIS — I5022 Chronic systolic (congestive) heart failure: Secondary | ICD-10-CM

## 2016-06-02 NOTE — Progress Notes (Signed)
EPIC Encounter for ICM Monitoring  Patient Name: Dakota Liu is a 82 y.o. male Date: 06/02/2016 Primary Care Physican: Woody Seller, MD Primary Cardiologist:Croitoru Electrophysiologist: Croitoru Dry Weight:     unknown Bi-V Pacing:  98.6%    Attempted ICM call and unable to reach.  Transmission reviewed.   Thoracic impedance returned to normal  Recommendations: NONE- unable to reach   Follow-up plan: ICM clinic phone appointment on 06/26/2016.  Copy of ICM check sent to primary cardiologist   ICM trend: 06/02/2016             Rosalene Billings, RN 06/02/2016 3:31 PM

## 2016-06-02 NOTE — Progress Notes (Signed)
Nice recovery

## 2016-06-02 NOTE — Telephone Encounter (Signed)
Confirmed remote transmission w/ pt wife.   

## 2016-06-02 NOTE — Telephone Encounter (Signed)
Remote ICM transmission received.  Attempted patient call and no answer 

## 2016-06-11 LAB — CUP PACEART REMOTE DEVICE CHECK
Battery Remaining Longevity: 34 mo
Battery Voltage: 2.98 V
Brady Statistic AP VP Percent: 0 %
Brady Statistic AS VP Percent: 63.15 %
Brady Statistic RA Percent Paced: 0 %
Brady Statistic RV Percent Paced: 63.16 %
Implantable Lead Implant Date: 20111230
Implantable Lead Implant Date: 20111230
Implantable Lead Model: 4196
Implantable Lead Model: 5076
Lead Channel Impedance Value: 1045 Ohm
Lead Channel Impedance Value: 1311 Ohm
Lead Channel Impedance Value: 1995 Ohm
Lead Channel Impedance Value: 323 Ohm
Lead Channel Impedance Value: 437 Ohm
Lead Channel Impedance Value: 950 Ohm
Lead Channel Pacing Threshold Amplitude: 0.625 V
Lead Channel Pacing Threshold Amplitude: 0.875 V
Lead Channel Pacing Threshold Pulse Width: 0.4 ms
Lead Channel Pacing Threshold Pulse Width: 0.4 ms
Lead Channel Pacing Threshold Pulse Width: 1.5 ms
Lead Channel Sensing Intrinsic Amplitude: 2.625 mV
Lead Channel Sensing Intrinsic Amplitude: 2.625 mV
Lead Channel Setting Pacing Amplitude: 1.5 V
Lead Channel Setting Pacing Amplitude: 4.5 V
Lead Channel Setting Pacing Pulse Width: 0.5 ms
Lead Channel Setting Sensing Sensitivity: 2.8 mV
MDC IDC LEAD IMPLANT DT: 20111230
MDC IDC LEAD LOCATION: 753858
MDC IDC LEAD LOCATION: 753859
MDC IDC LEAD LOCATION: 753860
MDC IDC MSMT LEADCHNL LV IMPEDANCE VALUE: 1254 Ohm
MDC IDC MSMT LEADCHNL LV PACING THRESHOLD AMPLITUDE: 3.5 V
MDC IDC MSMT LEADCHNL RV IMPEDANCE VALUE: 380 Ohm
MDC IDC MSMT LEADCHNL RV IMPEDANCE VALUE: 437 Ohm
MDC IDC MSMT LEADCHNL RV SENSING INTR AMPL: 22.125 mV
MDC IDC MSMT LEADCHNL RV SENSING INTR AMPL: 22.125 mV
MDC IDC PG IMPLANT DT: 20170404
MDC IDC SESS DTM: 20171109223759
MDC IDC SET LEADCHNL LV PACING PULSEWIDTH: 1.5 ms
MDC IDC SET LEADCHNL RV PACING AMPLITUDE: 1.5 V
MDC IDC STAT BRADY AP VS PERCENT: 0 %
MDC IDC STAT BRADY AS VS PERCENT: 36.84 %

## 2016-06-12 ENCOUNTER — Telehealth: Payer: Self-pay | Admitting: Cardiovascular Disease

## 2016-06-12 NOTE — Telephone Encounter (Signed)
Dakota Liu is calling about the prior authorization for Eliquis . Please call

## 2016-06-12 NOTE — Telephone Encounter (Signed)
New message    Need prior authorization on eliquis.

## 2016-06-13 NOTE — Telephone Encounter (Signed)
Faxed appeal questions to [employer]

## 2016-06-26 ENCOUNTER — Ambulatory Visit (INDEPENDENT_AMBULATORY_CARE_PROVIDER_SITE_OTHER): Payer: Medicare Other

## 2016-06-26 ENCOUNTER — Telehealth: Payer: Self-pay | Admitting: Cardiology

## 2016-06-26 DIAGNOSIS — Z950 Presence of cardiac pacemaker: Secondary | ICD-10-CM

## 2016-06-26 DIAGNOSIS — I5022 Chronic systolic (congestive) heart failure: Secondary | ICD-10-CM

## 2016-06-26 NOTE — Progress Notes (Signed)
EPIC Encounter for ICM Monitoring  Patient Name: Dakota Liu is a 82 y.o. male Date: 06/26/2016 Primary Care Physican: Woody Seller, MD Primary Cardiologist:Croitoru Electrophysiologist: Croitoru Dry Weight:unknown Bi-V Pacing: 96.2%      Heart Failure questions reviewed, pt asymptomatic   Thoracic impedance normal   Recommendations:  No changes.  Reinforced to limit low salt food choices to 2000 mg day and limiting fluid intake to < 2 liters per day. Encouraged to call for fluid symptoms.  Provided ICM direct number  Follow-up plan: ICM clinic phone appointment on 08/19/2016.  Office appointment with Dr Sallyanne Kuster 07/16/2016 for pacer check.   Copy of ICM check sent to primary cardiologist and device physician.   ICM trend: 06/26/2016       Rosalene Billings, RN 06/26/2016 5:51 PM

## 2016-06-26 NOTE — Telephone Encounter (Signed)
Spoke with pt and reminded pt of remote transmission that is due today. Pt verbalized understanding.   

## 2016-06-26 NOTE — Telephone Encounter (Signed)
Eliquis is approved as of 06/17/2016.

## 2016-07-03 NOTE — Progress Notes (Signed)
Really quick fix with cardioversion

## 2016-07-16 ENCOUNTER — Encounter: Payer: Self-pay | Admitting: Cardiovascular Disease

## 2016-07-16 ENCOUNTER — Ambulatory Visit (INDEPENDENT_AMBULATORY_CARE_PROVIDER_SITE_OTHER): Payer: Medicare Other | Admitting: Cardiovascular Disease

## 2016-07-16 VITALS — BP 119/58 | HR 69 | Ht 74.0 in | Wt 171.0 lb

## 2016-07-16 DIAGNOSIS — I472 Ventricular tachycardia, unspecified: Secondary | ICD-10-CM

## 2016-07-16 DIAGNOSIS — D500 Iron deficiency anemia secondary to blood loss (chronic): Secondary | ICD-10-CM | POA: Diagnosis not present

## 2016-07-16 DIAGNOSIS — I447 Left bundle-branch block, unspecified: Secondary | ICD-10-CM

## 2016-07-16 DIAGNOSIS — Z950 Presence of cardiac pacemaker: Secondary | ICD-10-CM | POA: Diagnosis not present

## 2016-07-16 DIAGNOSIS — I481 Persistent atrial fibrillation: Secondary | ICD-10-CM | POA: Diagnosis not present

## 2016-07-16 DIAGNOSIS — I4819 Other persistent atrial fibrillation: Secondary | ICD-10-CM

## 2016-07-16 DIAGNOSIS — I2510 Atherosclerotic heart disease of native coronary artery without angina pectoris: Secondary | ICD-10-CM

## 2016-07-16 DIAGNOSIS — I5022 Chronic systolic (congestive) heart failure: Secondary | ICD-10-CM

## 2016-07-16 LAB — COMPREHENSIVE METABOLIC PANEL
ALK PHOS: 55 U/L (ref 40–115)
ALT: 6 U/L — ABNORMAL LOW (ref 9–46)
AST: 10 U/L (ref 10–35)
Albumin: 3.6 g/dL (ref 3.6–5.1)
BILIRUBIN TOTAL: 0.6 mg/dL (ref 0.2–1.2)
BUN: 18 mg/dL (ref 7–25)
CALCIUM: 8.6 mg/dL (ref 8.6–10.3)
CO2: 29 mmol/L (ref 20–31)
Chloride: 104 mmol/L (ref 98–110)
Creat: 0.98 mg/dL (ref 0.70–1.11)
GLUCOSE: 118 mg/dL — AB (ref 65–99)
Potassium: 4.2 mmol/L (ref 3.5–5.3)
Sodium: 138 mmol/L (ref 135–146)
Total Protein: 5.7 g/dL — ABNORMAL LOW (ref 6.1–8.1)

## 2016-07-16 MED ORDER — METOPROLOL SUCCINATE ER 50 MG PO TB24: 50.0000 mg | ORAL_TABLET | Freq: Two times a day (BID) | ORAL | 11 refills | Status: DC

## 2016-07-16 MED ORDER — FUROSEMIDE 20 MG PO TABS: 40.0000 mg | ORAL_TABLET | Freq: Every day | ORAL | 11 refills | Status: DC

## 2016-07-16 NOTE — Progress Notes (Signed)
Cardiology Office Note    Date:  07/16/2016   ID:  ABDURREHMAN RAPSON, DOB Nov 30, 1927, MRN [id number]  PCP:  Woody Seller, MD  Cardiologist:   Sanda Klein, MD   Chief Complaint  Patient presents with  . Follow-up    PT C/O LOW BP     History of Present Illness:  Dakota Liu is a 83 y.o. male with a long-standing history of ischemic cardiomyopathy, well controlled systolic and diastolic heart failure, coronary artery disease with previous bypass surgery and percutaneous revascularization, PAD, paroxysmal atrial fibrillation and history of ventricular tachycardia.   About 9 months ago he underwent generator change out with downgrade from CRT-D to CRT-P (due to advanced age, improved LVEF and absence of VT/VF therapies).   2 months ago, in November 2017 he presented with a protracted episode of atrial fibrillation with rapid ventricular response and marked reduction in biventricular pacing and worsening volume status by Optivol.   He was started on anticoagulation and underwent TEE guided cardioversion. He had subsequent resolution of arrhythmia and immediate improvement in CRT and volume status.   He felt better immediately, but has subsequently developed a lot of weakness. At times at home his blood pressure is low (for example yesterday systolic blood pressure 79). He denies exertional dyspnea. He has not had overt bleeding but he looks quite pale today. In the past, despite episodes of paroxysmal atrial fibrillation we had elected not to treat with chronic anticoagulation due to iron deficiency anemia and frequent falls.   He has not noticed any reduction in his exercise tolerance, shortness of breath, edema or angina pectoris. He denies palpitations or any other irregularities. He has not had ankle edema. His weight is unchanged.   He reports that he has not had any recent bleeding, falls or injuries.  Pacemaker interrogation today shows 62% atrial pacing and 100%  biventricular pacing since his cardioversion. Almost immediately after his cardioversion volume status by optivol improved. He had a small bump in optivol level after Christmas but this is almost back to normal now.   He had CABG in '88 with subsequent PCI 12/11 (distal RCA stent), 3/12 ([place] angioplasty for in-stent restenosis). Last cardiac cath in October 2014 showed severe native vessel disease (70% left main, 95% ostial LAD occluded proximally, all OM branch is occluded, right coronary artery occluded proximally) but with patent grafts (LIMA to the LAD, SVG to diagonal, sequential SVG to OM1 and-1 to-13, SVG to RCA) and patent site of previous stent in the distal RCA.  He had moderate to severe ischemic cardiopathy with estimated LVEF of 30% and akinesis of the mid to distal anterior wall and mid to distal inferior wall and apical dyskinesis. He received a CRT-D Medtronic device in December 2011 with good response and an increase in LVEF to 40-45%.  He has treated hypertension and hyperlipidemia and a previous history of lymphoma and iron deficiency anemia. He wears braces on his ankles for unsteady joints.  His pacemaker has recorded asymptomatic atrial fibrillation but he has unsteady gait and falls. No history of stroke/TIA or other embolic events. Usually has paroxysmal arrhythmia, but in November 2017 underwent TEE guided electrical cardioversion for persistent atrial fibrillation and worsening CRT.  Past Medical History:  Diagnosis Date  . Anemia, iron deficiency 11/21/2011  . CAD (coronary artery disease) '88, 12/11, 3/12   CABG'88, PCI 12/1, 3/12  . CHF (congestive heart failure) ([place])   . Dyslipidemia   . Epididymitis   .  HTN (hypertension)   . Ischemic cardiomyopathy 10/14   EF improved to 40-45% after BiV ICD  . LBBB (left bundle branch block)   . Lymphoma ([place])    with metal radiation about 2006  . PAF (paroxysmal atrial fibrillation) ([place]) 12/11  . PVD  (peripheral vascular disease) ([place]) 11/10   AAA R&G  . VT (ventricular tachycardia) ([place]) 12/11    Past Surgical History:  Procedure Laterality Date  . ABDOMINAL AORTIC ANEURYSM REPAIR  11/10  . CARDIAC DEFIBRILLATOR PLACEMENT  12/11  . CARDIOVERSION N/A 05/20/2016   Procedure: CARDIOVERSION;  Surgeon: Sanda Klein, MD;  Location: [facility] ENDOSCOPY;  Service: Cardiovascular;  Laterality: N/A;  . CORONARY ANGIOPLASTY WITH STENT PLACEMENT  07/05/10, 3/12   RCA with ISR 3/12  . CORONARY ARTERY BYPASS GRAFT  1988  . EP IMPLANTABLE DEVICE N/A 10/09/2015   Procedure: ICD to [place];  Surgeon: Sanda Klein, MD;  Location: [facility];  Service: Cardiovascular;  Laterality: N/A;  . LEFT HEART CATHETERIZATION WITH CORONARY/GRAFT ANGIOGRAM N/A 05/03/2013   Procedure: LEFT HEART CATHETERIZATION WITH Beatrix Fetters;  Surgeon: Peter M Martinique, MD;  Location: [facility] CATH LAB;  Service: Cardiovascular;  Laterality: N/A;  . ORCHIECTOMY    . TEE WITHOUT CARDIOVERSION N/A 05/20/2016   Procedure: TRANSESOPHAGEAL ECHOCARDIOGRAM (TEE);  Surgeon: Sanda Klein, MD;  Location: [facility] ENDOSCOPY;  Service: Cardiovascular;  Laterality: N/A;  . TONSILLECTOMY      Current Medications: Outpatient Medications Prior to Visit  Medication Sig Dispense Refill  . clopidogrel (PLAVIX) 75 MG tablet Take 1 tablet (75 mg total) by mouth daily. 30 tablet 0  . iron polysaccharides (NU-IRON) 150 MG capsule Take 1 capsule (150 mg total) by mouth 2 (two) times daily. 60 capsule 0  . Menthol, Topical Analgesic, (BENGAY EX) Apply 1 application topically as needed (for pain).    . nitroGLYCERIN (NITROSTAT) 0.4 MG SL tablet Place 0.4 mg under the tongue every 5 (five) minutes as needed.      . pantoprazole (PROTONIX) 40 MG tablet Take 1 tablet (40 mg total) by mouth 2 (two) times daily. 60 tablet 0  . potassium chloride SA (KLOR-CON M20) 20 MEQ tablet Take 2 tablets (40 mEq total) by mouth daily.    .  ranolazine (RANEXA) 500 MG 12 hr tablet Take 1 tablet (500 mg total) by mouth 2 (two) times daily. 60 tablet 0  . simvastatin (ZOCOR) 20 MG tablet Take 1 tablet (20 mg total) by mouth daily. 30 tablet 11  . spironolactone (ALDACTONE) 25 MG tablet Take 0.5 tablets (12.5 mg total) by mouth daily. 15 tablet 6  . apixaban (ELIQUIS) 5 MG TABS tablet Take 1 tablet (5 mg total) by mouth 2 (two) times daily. 60 tablet 5  . furosemide (LASIX) 20 MG tablet Take 3 tablets (60 mg total) by mouth daily. 90 tablet 11  . metoprolol succinate (TOPROL-XL) 50 MG 24 hr tablet 2 TABLETS (100MG ) IN THE AM AND 1 TABLET (50MG ) IN THE PM (Patient taking differently: Take 50-100 mg by mouth See admin instructions. Take 100 mg by mouth in the morning and take 50 mg by mouth in the evening.) 90 tablet 5   No facility-administered medications prior to visit.      Allergies:   Tramadol; Ace inhibitors; Codeine; and Simvastatin   Social History   Social History  . Marital status: Married    Spouse name: N/A  . Number of children: N/A  . Years of education: N/A   Social  History Main Topics  . Smoking status: Former Smoker    Packs/day: 1.00    Years: 44.00    Types: Cigarettes    Quit date: 07/07/1984  . Smokeless tobacco: Never Used  . Alcohol use No  . Drug use: No  . Sexual activity: Not Asked   Other Topics Concern  . None   Social History Narrative  . None     Family History:  The patient's family history includes Cancer in his sister; Diabetes in his mother; Heart Problems in his mother; Stroke in his father.   ROS:   Please see the history of present illness.    ROS All other systems reviewed and are negative.   PHYSICAL EXAM:   VS:  BP (!) 119/58 (BP Location: Left Arm, Patient Position: Sitting, Cuff Size: Normal)   Pulse 69   Ht 6\' 2"  (1.88 m)   Wt 171 lb (77.6 kg)   SpO2 97%   BMI 21.96 kg/m    GEN: Well nourished, well developed, in no acute distress. He looks very pale today  HEENT:  normal  Neck: no JVD, carotid bruits, or masses Cardiac: irregular, no murmurs, rubs, or gallops, 1+ right ankle and low pretibial edema (SVG harvest site), minimal left pretibial and ankle edema  Respiratory:  clear to auscultation bilaterally, normal work of breathing GI: soft, nontender, nondistended, + BS MS: no deformity or atrophy  Skin: warm and dry, no rash Neuro:  Alert and Oriented x 3, Strength and sensation are intact Psych: euthymic mood, full affect  Wt Readings from Last 3 Encounters:  07/16/16 171 lb (77.6 kg)  05/22/16 173 lb (78.5 kg)  05/20/16 173 lb (78.5 kg)      Studies/Labs Reviewed:   EKG:  EKG is ordered today.  Shows atrial fibrillation. Although there is triggered ventricular pacing there is only pseudofusion without any true resynchronization. Broad QRS 134 ms, QTC 532 ms.  Recent Labs: 08/22/2015: ALT 8; Magnesium 1.9 09/26/2015: TSH 2.18 05/22/2016: B Natriuretic Peptide 273.1; BUN 14; Creatinine, Ser 0.98; Hemoglobin 9.5; Platelets 179; Potassium 3.5; Sodium 139   Lipid Panel Labs from August 20, 2015 show LDL cholesterol 51, total cholesterol 103, triglycerides 97, HDL 36.  ASSESSMENT:    1. Persistent atrial fibrillation ([place])   2. Chronic systolic congestive heart failure ([place])   3. Biventricular cardiac pacemaker in situ   4. Coronary artery disease involving native coronary artery of native heart without angina pectoris   5. VT (ventricular tachycardia) ([place])   6. Iron deficiency anemia due to chronic blood loss      PLAN:  In order of problems listed above:  1. AFib: Lengthy episode of persistent atrial fibrillation with successful cardioversion 2 months ago. He has symptoms of hypotension and we'll reduce his beta blocker dose to previous levels. If atrial fibrillation returns, Amiodarone might be the only option for antiarrhythmic therapy due to his comorbidities and long QT. 2. CHF: most recent EF 40-45%, well compensated symptoms,  some ankle swelling. Seems to be clinically euvolemic and his weakness is probably due to anemia. ARB had to be gradually weaned and eventually stopped due to symptomatic hypotension. I asked him to skip diuretics today and to start furosemide at a lower dose in the morning, due to his episodes of low blood pressure. 3. CRT-P: Poor resynchronization affect due to atrial fibrillation, now markedly improved. Relatively high LV pacing threshold (chronic). Continue remote downloads every 3 months and at least yearly office visits 4.  CAD s/p CABG and PCI: asymptomatic 5. VT: None recorded in several years   6. Anemia : He looks very pale and I don't think we can continue anticoagulation. Hemoglobin today, stop Eliquis. can consider referral for Watchman, although his age may limit the overall benefit of this intervention and I suspect our interventional/EP team will decline device implantation.  Medication Adjustments/Labs and Tests Ordered: Current medicines are reviewed at length with the patient today.  Concerns regarding medicines are outlined above.  Medication changes, Labs and Tests ordered today are listed in the Patient Instructions below. Patient Instructions  Medication Instructions: Dr Sallyanne Kuster has recommended making the following medication changes: 1. STOP Eliquis 2. DECREASE Metoprolol to 50 mg twice daily 3. NO FUROSEMIDE TODAY - TOMORROW START Furosemide 40 mg daily  Labwork: Your physician recommends that you return for lab work TODAY.  Testing/Procedures: NONE ORDERED  Follow-up: Your physician recommends that you schedule a follow-up appointment in 3-4 weeks with a PA/NP.  Dr Sallyanne Kuster recommends that you schedule a follow-up appointment in 3 months with a device check.  If you need a refill on your cardiac medications before your next appointment, please call your pharmacy.    Signed, Sanda Klein, MD  07/16/2016 6:09 PM    [facility] [place], [place], [place]  [address] Phone: [phone]; Fax: [phone]

## 2016-07-16 NOTE — Patient Instructions (Signed)
Medication Instructions: Dr Sallyanne Kuster has recommended making the following medication changes: 1. STOP Eliquis 2. DECREASE Metoprolol to 50 mg twice daily 3. NO FUROSEMIDE TODAY - TOMORROW START Furosemide 40 mg daily  Labwork: Your physician recommends that you return for lab work TODAY.  Testing/Procedures: NONE ORDERED  Follow-up: Your physician recommends that you schedule a follow-up appointment in 3-4 weeks with a PA/NP.  Dr Sallyanne Kuster recommends that you schedule a follow-up appointment in 3 months with a device check.  If you need a refill on your cardiac medications before your next appointment, please call your pharmacy.

## 2016-07-17 ENCOUNTER — Other Ambulatory Visit: Payer: Self-pay | Admitting: Cardiovascular Disease

## 2016-07-17 ENCOUNTER — Telehealth: Payer: Self-pay | Admitting: Cardiovascular Disease

## 2016-07-17 ENCOUNTER — Other Ambulatory Visit: Payer: Self-pay

## 2016-07-17 DIAGNOSIS — D62 Acute posthemorrhagic anemia: Secondary | ICD-10-CM

## 2016-07-17 DIAGNOSIS — D649 Anemia, unspecified: Secondary | ICD-10-CM

## 2016-07-17 LAB — CUP PACEART INCLINIC DEVICE CHECK
Battery Remaining Longevity: 54 mo
Battery Voltage: 3 V
Brady Statistic AP VS Percent: 0.31 %
Brady Statistic AS VP Percent: 36.81 %
Brady Statistic RA Percent Paced: 59.42 %
Brady Statistic RV Percent Paced: 95.06 %
Implantable Lead Implant Date: 20111230
Implantable Lead Implant Date: 20111230
Implantable Lead Location: 753858
Implantable Lead Location: 753860
Implantable Lead Model: 6947
Implantable Pulse Generator Implant Date: 20170404
Lead Channel Impedance Value: 1064 Ohm
Lead Channel Impedance Value: 1349 Ohm
Lead Channel Impedance Value: 2242 Ohm
Lead Channel Impedance Value: 304 Ohm
Lead Channel Impedance Value: 418 Ohm
Lead Channel Impedance Value: 418 Ohm
Lead Channel Pacing Threshold Amplitude: 1 V
Lead Channel Pacing Threshold Pulse Width: 0.4 ms
Lead Channel Sensing Intrinsic Amplitude: 2 mV
Lead Channel Sensing Intrinsic Amplitude: 2 mV
Lead Channel Sensing Intrinsic Amplitude: 21 mV
Lead Channel Setting Pacing Amplitude: 1.5 V
Lead Channel Setting Sensing Sensitivity: 2.8 mV
MDC IDC LEAD IMPLANT DT: 20111230
MDC IDC LEAD LOCATION: 753859
MDC IDC MSMT LEADCHNL LV IMPEDANCE VALUE: 1140 Ohm
MDC IDC MSMT LEADCHNL LV IMPEDANCE VALUE: 1444 Ohm
MDC IDC MSMT LEADCHNL LV PACING THRESHOLD AMPLITUDE: 3.75 V
MDC IDC MSMT LEADCHNL LV PACING THRESHOLD PULSEWIDTH: 1.5 ms
MDC IDC MSMT LEADCHNL RA PACING THRESHOLD AMPLITUDE: 0.75 V
MDC IDC MSMT LEADCHNL RA PACING THRESHOLD PULSEWIDTH: 0.4 ms
MDC IDC MSMT LEADCHNL RV IMPEDANCE VALUE: 380 Ohm
MDC IDC MSMT LEADCHNL RV SENSING INTR AMPL: 21 mV
MDC IDC SESS DTM: 20180110222752
MDC IDC SET LEADCHNL LV PACING AMPLITUDE: 4.5 V
MDC IDC SET LEADCHNL LV PACING PULSEWIDTH: 1.5 ms
MDC IDC SET LEADCHNL RA PACING AMPLITUDE: 1.5 V
MDC IDC SET LEADCHNL RV PACING PULSEWIDTH: 0.5 ms
MDC IDC STAT BRADY AP VP PERCENT: 62.19 %
MDC IDC STAT BRADY AS VS PERCENT: 0.69 %

## 2016-07-17 LAB — CBC
HEMATOCRIT: 22.6 % — AB (ref 38.5–50.0)
Hemoglobin: 6.3 g/dL — ABNORMAL LOW (ref 13.2–17.1)
MCH: 22 pg — ABNORMAL LOW (ref 27.0–33.0)
MCHC: 27.9 g/dL — ABNORMAL LOW (ref 32.0–36.0)
MCV: 78.7 fL — AB (ref 80.0–100.0)
MPV: 10 fL (ref 7.5–12.5)
Platelets: 189 10*3/uL (ref 140–400)
RBC: 2.87 MIL/uL — AB (ref 4.20–5.80)
RDW: 18.4 % — AB (ref 11.0–15.0)
WBC: 6.3 10*3/uL (ref 3.8–10.8)

## 2016-07-17 MED ORDER — SODIUM CHLORIDE 0.9 % IV SOLN: Freq: Once | INTRAVENOUS | Status: DC

## 2016-07-17 MED ORDER — ACETAMINOPHEN 325 MG PO TABS: 650.0000 mg | ORAL_TABLET | Freq: Once | ORAL | Status: AC

## 2016-07-17 MED ORDER — FUROSEMIDE 10 MG/ML IJ SOLN: 40.0000 mg | Freq: Once | INTRAMUSCULAR | Status: DC

## 2016-07-17 NOTE — Telephone Encounter (Signed)
Chelley, can we please set him up for a transfusion of 2 units PRBC, furosemide 40 mg IV in between units, please?

## 2016-07-17 NOTE — Telephone Encounter (Signed)
Thank you for your work, [place]!

## 2016-07-17 NOTE — Telephone Encounter (Signed)
Patient scheduled for transfusion tomorrow at the [place] at [facility] orders placed - see orders only encounter.  Called patient, spoke with [name] (DPR). Mary verbalized understanding and agreed with plan. Address and phone number given for [place]. Notified Mary to have patient call if he has any additional questions. Wife voiced appreciation for phone call.

## 2016-07-17 NOTE — Telephone Encounter (Signed)
Received alert notification from [facility] for this patient's hemoglobin result of 6.3  Sent to provider for review and recommendations.

## 2016-07-18 ENCOUNTER — Telehealth: Payer: Self-pay | Admitting: Cardiovascular Disease

## 2016-07-18 ENCOUNTER — Ambulatory Visit (HOSPITAL_COMMUNITY)
Admission: RE | Admit: 2016-07-18 | Discharge: 2016-07-18 | Disposition: A | Discharge status: Home / Self Care | Payer: Medicare Other | Source: Ambulatory Visit | Attending: Anesthesiology | Admitting: Anesthesiology

## 2016-07-18 DIAGNOSIS — I519 Heart disease, unspecified: Secondary | ICD-10-CM | POA: Diagnosis not present

## 2016-07-18 DIAGNOSIS — Z950 Presence of cardiac pacemaker: Secondary | ICD-10-CM | POA: Diagnosis not present

## 2016-07-18 DIAGNOSIS — D649 Anemia, unspecified: Secondary | ICD-10-CM | POA: Diagnosis not present

## 2016-07-18 LAB — CBC
HEMATOCRIT: 29.7 % — AB (ref 39.0–52.0)
Hemoglobin: 9 g/dL — ABNORMAL LOW (ref 13.0–17.0)
MCH: 24.1 pg — AB (ref 26.0–34.0)
MCHC: 30.3 g/dL (ref 30.0–36.0)
MCV: 79.6 fL (ref 78.0–100.0)
Platelets: 193 10*3/uL (ref 150–400)
RBC: 3.73 MIL/uL — AB (ref 4.22–5.81)
RDW: 17.1 % — ABNORMAL HIGH (ref 11.5–15.5)
WBC: 5.5 10*3/uL (ref 4.0–10.5)

## 2016-07-18 LAB — HEMOGLOBIN AND HEMATOCRIT, BLOOD
HEMATOCRIT: 22.3 % — AB (ref 39.0–52.0)
Hemoglobin: 6.5 g/dL — CL (ref 13.0–17.0)

## 2016-07-18 LAB — PREPARE RBC (CROSSMATCH)

## 2016-07-18 MED ORDER — ACETAMINOPHEN 325 MG PO TABS
650.0000 mg | ORAL_TABLET | Freq: Once | ORAL | Status: AC
  Administered 2016-07-18: 650 mg via ORAL
  Filled 2016-07-18: qty 2

## 2016-07-18 MED ORDER — SODIUM CHLORIDE 0.9 % IV SOLN: Freq: Once | INTRAVENOUS | Status: DC

## 2016-07-18 MED ORDER — SODIUM CHLORIDE 0.9 % IV SOLN
Freq: Once | INTRAVENOUS | Status: AC
  Administered 2016-07-18: 11:00:00 via INTRAVENOUS

## 2016-07-18 MED ORDER — FUROSEMIDE 10 MG/ML IJ SOLN
40.0000 mg | Freq: Once | INTRAMUSCULAR | Status: AC
  Administered 2016-07-18: 40 mg via INTRAVENOUS
  Filled 2016-07-18: qty 4

## 2016-07-18 MED ORDER — FUROSEMIDE 10 MG/ML IJ SOLN: 40.0000 mg | Freq: Once | INTRAMUSCULAR | Status: DC

## 2016-07-18 NOTE — Telephone Encounter (Signed)
error 

## 2016-07-18 NOTE — Discharge Instructions (Signed)

## 2016-07-18 NOTE — Progress Notes (Signed)
CRITICAL VALUE ALERT  Critical value received:  Hgb 6.5  Date of notification:  07/18/2016  Time of notification:  0951  Critical value read back:Yes.    Nurse who received alert:  B. Tamala Julian  MD notified (1st page):  Croitoru   Time of first page:  [phone]  MD notified (2nd page):  Time of second page:  Responding MD:  Damaris Schooner with Rivka Safer, Dr. Victorino December nurse  Time MD responded:  [phone]

## 2016-07-18 NOTE — Progress Notes (Signed)
Diagnosis: Symptomatic anemia and cardiac disease  Provider: Dr. Sallyanne Kuster  Procedure: Pt was typed and cross match. Pt received 2 units of PRBCs. Post transfusion of cbc drawn per order.  Pt tolerated procedure well.  Post procedure: Pt alert,and oriented. Discharged with wife per via w/c.

## 2016-07-19 LAB — TYPE AND SCREEN
ABO/RH(D): O POS
Antibody Screen: NEGATIVE
Unit division: 0
Unit division: 0

## 2016-08-12 ENCOUNTER — Ambulatory Visit: Payer: Medicare Other | Admitting: Cardiology

## 2016-08-19 ENCOUNTER — Ambulatory Visit (INDEPENDENT_AMBULATORY_CARE_PROVIDER_SITE_OTHER): Payer: Medicare Other

## 2016-08-19 ENCOUNTER — Telehealth: Payer: Self-pay | Admitting: Cardiology

## 2016-08-19 DIAGNOSIS — Z950 Presence of cardiac pacemaker: Secondary | ICD-10-CM | POA: Diagnosis not present

## 2016-08-19 DIAGNOSIS — I5022 Chronic systolic (congestive) heart failure: Secondary | ICD-10-CM | POA: Diagnosis not present

## 2016-08-19 NOTE — Progress Notes (Signed)
EPIC Encounter for ICM Monitoring  Patient Name: Dakota Liu is a 83 y.o. male Date: 08/19/2016 Primary Care Physican: Woody Seller, MD Primary Cardiologist:Croitoru Electrophysiologist: Croitoru Dry Weight:unknown Bi-V Pacing: 97.1%        Heart Failure questions reviewed, pt asymptomatic  He stated he is doing well.     Thoracic impedance normal.  Recommendations: No changes. Encouraged to call for fluid symptoms.  Follow-up plan: ICM clinic phone appointment on 09/19/2016.  Copy of ICM check sent to device physician.   3 month ICM trend: 08/19/2016   1 Year ICM trend:      Rosalene Billings, RN 08/19/2016 1:44 PM

## 2016-08-19 NOTE — Telephone Encounter (Signed)
Spoke with pt and reminded pt of remote transmission that is due today. Pt verbalized understanding.   

## 2016-08-19 NOTE — Progress Notes (Signed)
Great, thanks MCr 

## 2016-09-17 DIAGNOSIS — T50905A Adverse effect of unspecified drugs, medicaments and biological substances, initial encounter: Secondary | ICD-10-CM

## 2016-09-17 DIAGNOSIS — N62 Hypertrophy of breast: Secondary | ICD-10-CM | POA: Insufficient documentation

## 2016-09-19 ENCOUNTER — Telehealth: Payer: Self-pay | Admitting: Cardiology

## 2016-09-19 ENCOUNTER — Ambulatory Visit (INDEPENDENT_AMBULATORY_CARE_PROVIDER_SITE_OTHER): Payer: Medicare Other

## 2016-09-19 DIAGNOSIS — I5022 Chronic systolic (congestive) heart failure: Secondary | ICD-10-CM

## 2016-09-19 DIAGNOSIS — Z950 Presence of cardiac pacemaker: Secondary | ICD-10-CM | POA: Diagnosis not present

## 2016-09-19 NOTE — Telephone Encounter (Signed)
Spoke with pt and reminded pt of remote transmission that is due today. Pt verbalized understanding.   

## 2016-09-19 NOTE — Progress Notes (Signed)
EPIC Encounter for ICM Monitoring  Patient Name: Dakota Liu is a 83 y.o. male Date: 09/19/2016 Primary Care Physican: Woody Seller, MD Primary Cardiologist:Croitoru Electrophysiologist: Croitoru Dry Weight:unknown Bi-V Pacing:  98.9%       .Transmission reviewed   Thoracic impedance normal   Recommendations:  None  Follow-up plan: ICM clinic phone appointment on 10/23/2016.  Copy of ICM check sent to primary cardiologist and device physician.   3 month ICM trend: 09/19/2016   1 Year ICM trend:      Rosalene Billings, RN 09/19/2016 5:05 PM

## 2016-10-06 ENCOUNTER — Other Ambulatory Visit: Payer: Self-pay

## 2016-10-06 MED ORDER — METOPROLOL SUCCINATE ER 50 MG PO TB24: 50.0000 mg | ORAL_TABLET | Freq: Two times a day (BID) | ORAL | 3 refills | Status: DC

## 2016-10-23 ENCOUNTER — Telehealth: Payer: Self-pay | Admitting: Cardiology

## 2016-10-23 ENCOUNTER — Ambulatory Visit (INDEPENDENT_AMBULATORY_CARE_PROVIDER_SITE_OTHER): Payer: Medicare Other

## 2016-10-23 DIAGNOSIS — Z950 Presence of cardiac pacemaker: Secondary | ICD-10-CM | POA: Diagnosis not present

## 2016-10-23 DIAGNOSIS — I5022 Chronic systolic (congestive) heart failure: Secondary | ICD-10-CM

## 2016-10-23 NOTE — Telephone Encounter (Signed)
Confirmed remote transmission w/ pt wife.   

## 2016-10-23 NOTE — Progress Notes (Signed)
Agree MCr 

## 2016-10-23 NOTE — Progress Notes (Signed)
EPIC Encounter for ICM Monitoring  Patient Name: Dakota Liu is a 83 y.o. male Date: 10/23/2016 Primary Care Physican: Woody Seller, MD Primary Cardiologist:Croitoru Electrophysiologist: Croitoru Dry Weight:unknown Bi-V Pacing:  98.9%     Clinical Status (19-Sep-2016 to 23-Oct-2016) AT/AF 1 episode  Time in AT/AF 5.5 hr/day (23.0%)  Observations (2) (19-Sep-2016 to 23-Oct-2016)  8 days with more than 6 hr AT/AF.  Patient Activity less than 1 hr/day for 4 weeks.      Heart Failure questions reviewed, pt asymptomatic.   Thoracic impedance slightly below baseline, abnormal suggesting fluid accumulation.  Prescribed and confirmed dosage: Furosemide 20 mg 2 tablets (40 mg total) daily.  Potassium 20 mEq 2 tablets (40 mEq total) daily.    Recommendations: No changes. Advised to limit salt intake to 2000 mg/day and he reported he does not use salt shaker but does not check food labels. Encouraged to read the labels because he is probably eating more salt than he realizes.  Encouraged to call for fluid symptoms.  Follow-up plan: ICM clinic phone appointment on 11/25/2016.    Copy of ICM check sent to cardiologist and device physician.   3 month ICM trend: 10/23/2016   1 Year ICM trend:      Rosalene Billings, RN 10/23/2016 2:25 PM

## 2016-11-25 ENCOUNTER — Ambulatory Visit (INDEPENDENT_AMBULATORY_CARE_PROVIDER_SITE_OTHER): Payer: Medicare Other

## 2016-11-25 DIAGNOSIS — I5022 Chronic systolic (congestive) heart failure: Secondary | ICD-10-CM

## 2016-11-25 DIAGNOSIS — Z950 Presence of cardiac pacemaker: Secondary | ICD-10-CM | POA: Diagnosis not present

## 2016-11-25 NOTE — Progress Notes (Signed)
EPIC Encounter for ICM Monitoring  Patient Name: Dakota Liu is a 83 y.o. male Date: 11/25/2016 Primary Care Physican: Christain [place], MD Primary Cardiologist:Croitoru Electrophysiologist: Croitoru Dry Weight:unknown  Clinical Status (23-Oct-2016 to 25-Nov-2016) V. Pacing 66.4%  AT/AF 1 episode Time in AT/AF 24.0 hr/day (100.0%)  Observations (3) (23-Oct-2016 to 25-Nov-2016)  34 days with more than 6 hr AT/AF.  V. Pacing less than 90%.  Patient Activity less than 1 hr/day for 4 weeks.     Heart Failure questions reviewed, pt asymptomatic    Thoracic impedance normal.  Prescribed and confirmed dosage: Furosemide 20 mg 2 tablets (40 mg total) daily.  Potassium 20 mEq 2 tablets (40 mEq total) daily.   Labs: 07/16/2016 Creatinine 0.98, BUN 18, Potassium 4.2, Sodium 138  Recommendations: He stated he has been avoiding salt.  No changes.  Encouraged to call for fluid symptoms or use local ER for any urgent symptoms.  Follow-up plan: ICM clinic phone appointment on 01/06/2017.  Office appointment scheduled on 12/03/2016 with Dr Sallyanne Kuster.  Copy of ICM check sent to cardiologist/device physician.   3 month ICM trend: 11/25/2016   1 Year ICM trend:      Rosalene Billings, RN 11/25/2016 1:30 PM

## 2016-11-25 NOTE — Progress Notes (Signed)
He is back in AFib for a month now and BiV pacing down to 66%. I'm glad you are getting his download, Margarita Grizzle, because device clinic has not had a check since January - not sure why... Can we trouble shoot, please device folks?  Chelley, please make an EP referral (preferably Dr. Rayann Heman) to discuss antiarrhythmics and Watchman - he is off anticoagulation due to recurrent anemia. We used it short term at his last cardioversion 5 months ago without complications.  We had to cut back his beta blocker in January due to symptomatic hypotension. Chelley: Please start him on digoxin 0.125 mg daily.   Please make him an appointment to see me next week.  MCr

## 2016-11-27 ENCOUNTER — Other Ambulatory Visit: Payer: Self-pay | Admitting: Cardiovascular Disease

## 2016-11-27 NOTE — Telephone Encounter (Signed)
Rx has been sent to the pharmacy electronically. ° °

## 2016-11-30 NOTE — Progress Notes (Signed)
Cardiology Office Note    Date:  12/03/2016   ID:  Dakota Liu, DOB 02-24-1928, MRN [id number]  PCP:  Christain Sacramento, MD  Cardiologist:   Sanda Klein, MD   Chief Complaint  Patient presents with  . Follow-up    pt c/o DOE    History of Present Illness:  Dakota Liu is a 83 y.o. male with a long-standing history of ischemic cardiomyopathy, well controlled systolic and diastolic heart failure, coronary artery disease with previous bypass surgery and percutaneous revascularization, PAD, paroxysmal atrial fibrillation and history of ventricular tachycardia.   Roughly one year ago he underwent generator change out with downgrade from CRT-D to CRT-P (due to advanced age, improved LVEF and absence of VT/VF therapies). In November 2017 he presented with about a month long episode of persistent atrial fibrillation and marked worsening of biventricular pacing, successful electrical cardioversion performed. Recent device interrogation shows that in mid April he went back into atrial fibrillation. Fortunately, this time his ventricular rate has been better controlled and there has been no evidence of heart failure by thoracic impedance measurements, even though the success rate of CRT is decreased at 66%. Digoxin started roughly a week ago when we found out that he was back in atrial fibrillation.  This had recurrent problems with falls and anemia and we had to stop long-term anticoagulation. He is on clopidogrel. He has not had recent overt bleeding or falls. He required transfusion of 2 units of PRBC in January for hemoglobin of 6.5.He feels well right now and is able to perform all activities without restriction. He does feel a little weaker when his blood pressures lower (as low as 100/55) feels better when his systolic blood pressure is 120. He only becomes dyspneic when he bends over. He does not have orthopnea or exertional dyspnea.   He has not noticed any reduction in his exercise  tolerance, shortness of breath, edema or angina pectoris. He denies palpitations or any other irregularities. He has not had ankle edema. His weight is unchanged.   He reports that he has not had any recent bleeding, falls or injuries. Does complain of sore nipples and gynecomastia.  Pacemaker interrogation today shows persistent atrial fibrillation for over 30 days and 87 % biventricular pacing (12% VSRP). Resting ECG today shows a ventricular rate of 100 bpm. Many of the paced beats are pseudo-fused.  He had CABG in '88 with subsequent PCI 12/11 (distal RCA stent), 3/12 ([place] angioplasty for in-stent restenosis). Last cardiac cath in October 2014 showed severe native vessel disease (70% left main, 95% ostial LAD occluded proximally, all OM branch is occluded, right coronary artery occluded proximally) but with patent grafts (LIMA to the LAD, SVG to diagonal, sequential SVG to OM1 and-1 to-13, SVG to RCA) and patent site of previous stent in the distal RCA.  He had moderate to severe ischemic cardiopathy with estimated LVEF of 30% and akinesis of the mid to distal anterior wall and mid to distal inferior wall and apical dyskinesis. He received a CRT-D Medtr[employer] device in December 2011 with good response and an increase in LVEF to 40-45%.  He has treated hypertension and hyperlipidemia and a previous history of lymphoma and iron deficiency anemia. He wears braces on his ankles for unsteady joints.  His pacemaker has recorded asymptomatic atrial fibrillation but he has unsteady gait and falls. No history of stroke/TIA or other embolic events. Usually has paroxysmal arrhythmia, but in November 2017 underwent TEE guided electrical  cardioversion for persistent atrial fibrillation and worsening CRT. Recurrent persistent atrial fibrillation April-May 2018.  Past Medical History:  Diagnosis Date  . Anemia, iron deficiency 11/21/2011  . CAD (coronary artery disease) '88, 12/11, 3/12   CABG'88,  PCI 12/1, 3/12  . CHF (congestive heart failure) ([place])   . Dyslipidemia   . Epididymitis   . HTN (hypertension)   . Ischemic cardiomyopathy 10/14   EF improved to 40-45% after BiV ICD  . LBBB (left bundle branch block)   . Lymphoma ([place])    with metal radiation about 2006  . PAF (paroxysmal atrial fibrillation) ([place]) 12/11  . PVD (peripheral vascular disease) ([place]) 11/10   AAA R&G  . VT (ventricular tachycardia) ([place]) 12/11    Past Surgical History:  Procedure Laterality Date  . ABDOMINAL AORTIC ANEURYSM REPAIR  11/10  . CARDIAC DEFIBRILLATOR PLACEMENT  12/11  . CARDIOVERSION N/A 05/20/2016   Procedure: CARDIOVERSION;  Surgeon: Sanda Klein, MD;  Location: [facility] ENDOSCOPY;  Service: Cardiovascular;  Laterality: N/A;  . CORONARY ANGIOPLASTY WITH STENT PLACEMENT  07/05/10, 3/12   RCA with ISR 3/12  . CORONARY ARTERY BYPASS GRAFT  1988  . EP IMPLANTABLE DEVICE N/A 10/09/2015   Procedure: ICD to [place];  Surgeon: Sanda Klein, MD;  Location: [facility] CV LAB;  Service: Cardiovascular;  Laterality: N/A;  . LEFT HEART CATHETERIZATION WITH CORONARY/GRAFT ANGIOGRAM N/A 05/03/2013   Procedure: LEFT HEART CATHETERIZATION WITH Beatrix Fetters;  Surgeon: Peter M Martinique, MD;  Location: [facility] CATH LAB;  Service: Cardiovascular;  Laterality: N/A;  . ORCHIECTOMY    . TEE WITHOUT CARDIOVERSION N/A 05/20/2016   Procedure: TRANSESOPHAGEAL ECHOCARDIOGRAM (TEE);  Surgeon: Sanda Klein, MD;  Location: [facility] ENDOSCOPY;  Service: Cardiovascular;  Laterality: N/A;  . TONSILLECTOMY      Current Medications: Outpatient Medications Prior to Visit  Medication Sig Dispense Refill  . clopidogrel (PLAVIX) 75 MG tablet Take 1 tablet (75 mg total) by mouth daily. 30 tablet 0  . furosemide (LASIX) 20 MG tablet Take 2 tablets (40 mg total) by mouth daily. 60 tablet 11  . iron polysaccharides (NU-IRON) 150 MG capsule Take 1 capsule (150 mg total) by mouth 2 (two) times daily. 60 capsule 0    . Menthol, Topical Analgesic, (BENGAY EX) Apply 1 application topically as needed (for pain).    . metoprolol succinate (TOPROL-XL) 50 MG 24 hr tablet Take 1 tablet (50 mg total) by mouth 2 (two) times daily. 180 tablet 3  . nitroGLYCERIN (NITROSTAT) 0.4 MG SL tablet Place 0.4 mg under the tongue every 5 (five) minutes as needed.      . pantoprazole (PROTONIX) 40 MG tablet Take 1 tablet (40 mg total) by mouth 2 (two) times daily. 60 tablet 0  . potassium chloride SA (KLOR-CON M20) 20 MEQ tablet Take 2 tablets (40 mEq total) by mouth daily.    . ranolazine (RANEXA) 500 MG 12 hr tablet Take 1 tablet (500 mg total) by mouth 2 (two) times daily. 60 tablet 0  . simvastatin (ZOCOR) 20 MG tablet Take 1 tablet (20 mg total) by mouth daily. 30 tablet 11  . metoprolol succinate (TOPROL-XL) 50 MG 24 hr tablet TAKE 2 TABLETS IN THE MORNING AND 1 TABLET IN THE EVENING 90 tablet 1  . spironolactone (ALDACTONE) 25 MG tablet Take 0.5 tablets (12.5 mg total) by mouth daily. 15 tablet 6   Facility-Administered Medications Prior to Visit  Medication Dose Route Frequency Provider Last Rate Last Dose  . 0.9 %  sodium chloride infusion   Intravenous Once Aurilla Coulibaly, MD      . 0.9 %  sodium chloride infusion   Intravenous Once Wesam Gearhart, MD      . acetaminophen (TYLENOL) tablet 650 mg  650 mg Oral Once Deborah Lazcano, MD      . furosemide (LASIX) injection 40 mg  40 mg Intravenous Once Landon Bassford, MD         Allergies:   Tramadol; Ace inhibitors; Codeine; and Simvastatin   Social History   Social History  . Marital status: Married    Spouse name: N/A  . Number of children: N/A  . Years of education: N/A   Social History Main Topics  . Smoking status: Former Smoker    Packs/day: 1.00    Years: 44.00    Types: Cigarettes    Quit date: 07/07/1984  . Smokeless tobacco: Never Used  . Alcohol use No  . Drug use: No  . Sexual activity: Not Asked   Other Topics Concern  . None   Social  History Narrative  . None     Family History:  The patient's family history includes Cancer in his sister; Diabetes in his mother; Heart Problems in his mother; Stroke in his father.   ROS:   Please see the history of present illness.    ROS All other systems reviewed and are negative.   PHYSICAL EXAM:   VS:  BP 110/76   Pulse (!) 101   Ht 6\' 2"  (1.88 m)   Wt 170 lb (77.1 kg)   BMI 21.83 kg/m     General: Alert, oriented x3, no distress Head: no evidence of trauma, PERRL, EOMI, no exophtalmos or lid lag, no myxedema, no xanthelasma; normal ears, nose and oropharynx Neck: normal jugular venous pulsations and no hepatojugular reflux; brisk carotid pulses without delay and no carotid bruits Chest: clear to auscultation, no signs of consolidation by percussion or palpation, normal fremitus, symmetrical and full respiratory excursions. Healthy pacemaker site. Mild gynecomastia. Cardiovascular: normal position and quality of the apical impulse, irregular rhythm, normal first and second heart sounds, no murmurs, rubs or gallops Abdomen: no tenderness or distention, no masses by palpation, no abnormal pulsatility or arterial bruits, normal bowel sounds, no hepatosplenomegaly Extremities: no clubbing, cyanosis or edema; 2+ radial, ulnar and brachial pulses bilaterally; 2+ right femoral, posterior tibial and dorsalis pedis pulses; 2+ left femoral, posterior tibial and dorsalis pedis pulses; no subclavian or femoral bruits Neurological: grossly nonfocal Psych: euthymic mood, full affect  Wt Readings from Last 3 Encounters:  12/03/16 170 lb (77.1 kg)  07/16/16 171 lb (77.6 kg)  05/22/16 173 lb (78.5 kg)      Studies/Labs Reviewed:   EKG:  EKG is  ordered today.  Shows atrial fibrillation with mild rapid ventricular response, mostly ventricular paced beats, but some of them with pseudofusion or effusion  Recent Labs: 05/22/2016: B Natriuretic Peptide 273.1 07/16/2016: ALT 6 07/18/2016:  Hemoglobin 9.0 12/03/2016: BUN 20; Creatinine, Ser 1.20; Platelets 153; Potassium 4.1; Sodium 138  Had hemoglobin 6.5, increased to 9.0 after 2 units transfusion  Lipid Panel Labs from August 20, 2015 show LDL cholesterol 51, total cholesterol 103, triglycerides 97, HDL 36.  ASSESSMENT:    1. Persistent atrial fibrillation ([place])   2. Chronic systolic congestive heart failure ([place])   3. Biventricular cardiac pacemaker in situ   4. Iron deficiency anemia due to chronic blood loss   5. Coronary artery disease involving native coronary artery of  native heart without angina pectoris   6. Drug-induced gynecomastia   7. Medication management      PLAN:  In order of problems listed above:  1. AFib: Lengthy episode of persistent atrial fibrillation with worsened CRT, thankfully this time without optivol change or clinical heart failure decompensation. Had successful cardioversion in November and maintained sinus rhythm for about 4 months. We had to discontinue his anticoagulation due to recurrent severe anemia. We had to stop ARB and reduce beta blockers due to hypotension. I've asked for referral to EP clinic to discuss the value of continued attempts at maintenance of sinus rhythm and to discuss the potential benefit of a watchman device. He has proven that he can tolerate anticoagulation for 4 months, let alone the 30 days required for the Watchman. On the other hand his age and comorbid condition may make him less likely to benefit from [place] in the long run. 2. CHF: By clinical exam and optic all appears to be well compensated, but atrial fibrillation has led to deterioration in the past. Most recent estimated EF 40-45%. ARB was stopped and beta blockers decreased due to hypotension. Digoxin added just a week ago. 3. CRT-P: Poor resynchronization affect due to atrial fibrillation, markedly improved after his cardioversion, now worse again. LV pacing threshold remains relatively high. Continue  follow-up in the heart failure device clinic monthly. Continue remote downloads every 3 months and at least yearly office visits 4. Anemia :  hemoglobin rechecked today shows improvement, but still has microcytosis and mild anemia. 5. CAD s/p CABG: Does not have angina pectoris 6. Gynecomastia: Stop spironolactone  Medication Adjustments/Labs and Tests Ordered: Current medicines are reviewed at length with the patient today.  Concerns regarding medicines are outlined above.  Medication changes, Labs and Tests ordered today are listed in the Patient Instructions below. Patient Instructions  Medication Instructions: Dr Sallyanne Kuster has recommended making the following medication changes: 1. START Digoxin 0.25 mg - take 1 tablet by mouth daily 2. STOP Spironolactone  Labwork: Your physician recommends that you return for lab work TODAY.  Testing/Procedures: NONE ORDERED  Follow-up: Your physician recommends that you schedule a follow-up appointment in 1 month with Chanetta Marshall, NP.  Dr Sallyanne Kuster recommends that you schedule a follow-up appointment first available. You will receive a reminder letter in the mail two months in advance. If you don't receive a letter, please call our office to schedule the follow-up appointment.  If you need a refill on your cardiac medications before your next appointment, please call your pharmacy.    Signed, Sanda Klein, MD  12/03/2016 4:52 PM    [facility] [address], [place], [place]  [address] Phone: [phone]; Fax: [phone]

## 2016-12-03 ENCOUNTER — Encounter: Payer: Self-pay | Admitting: Cardiovascular Disease

## 2016-12-03 ENCOUNTER — Ambulatory Visit (INDEPENDENT_AMBULATORY_CARE_PROVIDER_SITE_OTHER): Payer: Medicare Other | Admitting: Cardiovascular Disease

## 2016-12-03 VITALS — BP 110/76 | HR 101 | Ht 74.0 in | Wt 170.0 lb

## 2016-12-03 DIAGNOSIS — N62 Hypertrophy of breast: Secondary | ICD-10-CM

## 2016-12-03 DIAGNOSIS — I2510 Atherosclerotic heart disease of native coronary artery without angina pectoris: Secondary | ICD-10-CM

## 2016-12-03 DIAGNOSIS — D500 Iron deficiency anemia secondary to blood loss (chronic): Secondary | ICD-10-CM | POA: Diagnosis not present

## 2016-12-03 DIAGNOSIS — Z950 Presence of cardiac pacemaker: Secondary | ICD-10-CM | POA: Diagnosis not present

## 2016-12-03 DIAGNOSIS — Z79899 Other long term (current) drug therapy: Secondary | ICD-10-CM | POA: Diagnosis not present

## 2016-12-03 DIAGNOSIS — I5022 Chronic systolic (congestive) heart failure: Secondary | ICD-10-CM | POA: Diagnosis not present

## 2016-12-03 DIAGNOSIS — T50905A Adverse effect of unspecified drugs, medicaments and biological substances, initial encounter: Secondary | ICD-10-CM

## 2016-12-03 DIAGNOSIS — I447 Left bundle-branch block, unspecified: Secondary | ICD-10-CM

## 2016-12-03 DIAGNOSIS — I481 Persistent atrial fibrillation: Secondary | ICD-10-CM

## 2016-12-03 DIAGNOSIS — I4819 Other persistent atrial fibrillation: Secondary | ICD-10-CM

## 2016-12-03 DIAGNOSIS — I255 Ischemic cardiomyopathy: Secondary | ICD-10-CM | POA: Diagnosis not present

## 2016-12-03 LAB — CUP PACEART INCLINIC DEVICE CHECK
Battery Voltage: 2.98 V
Brady Statistic AP VS Percent: 0.04 %
Brady Statistic AS VS Percent: 12.8 %
Brady Statistic RA Percent Paced: 29.93 %
Brady Statistic RV Percent Paced: 86.83 %
Implantable Lead Implant Date: 20111230
Implantable Lead Implant Date: 20111230
Implantable Lead Location: 753858
Implantable Lead Location: 753860
Implantable Lead Model: 4196
Implantable Lead Model: 5076
Implantable Pulse Generator Implant Date: 20170404
Lead Channel Impedance Value: 1026 Ohm
Lead Channel Impedance Value: 1501 Ohm
Lead Channel Impedance Value: 2318 Ohm
Lead Channel Impedance Value: 323 Ohm
Lead Channel Impedance Value: 437 Ohm
Lead Channel Pacing Threshold Amplitude: 3.5 V
Lead Channel Pacing Threshold Pulse Width: 0.4 ms
Lead Channel Pacing Threshold Pulse Width: 0.4 ms
Lead Channel Sensing Intrinsic Amplitude: 2.625 mV
Lead Channel Sensing Intrinsic Amplitude: 21.75 mV
Lead Channel Sensing Intrinsic Amplitude: 3.25 mV
Lead Channel Setting Pacing Amplitude: 1.5 V
Lead Channel Setting Pacing Pulse Width: 0.5 ms
Lead Channel Setting Sensing Sensitivity: 2.8 mV
MDC IDC LEAD IMPLANT DT: 20111230
MDC IDC LEAD LOCATION: 753859
MDC IDC MSMT BATTERY REMAINING LONGEVITY: 33 mo
MDC IDC MSMT LEADCHNL LV IMPEDANCE VALUE: 1121 Ohm
MDC IDC MSMT LEADCHNL LV IMPEDANCE VALUE: 1577 Ohm
MDC IDC MSMT LEADCHNL LV PACING THRESHOLD PULSEWIDTH: 1.5 ms
MDC IDC MSMT LEADCHNL RA IMPEDANCE VALUE: 418 Ohm
MDC IDC MSMT LEADCHNL RA PACING THRESHOLD AMPLITUDE: 0.875 V
MDC IDC MSMT LEADCHNL RV IMPEDANCE VALUE: 380 Ohm
MDC IDC MSMT LEADCHNL RV PACING THRESHOLD AMPLITUDE: 0.875 V
MDC IDC MSMT LEADCHNL RV SENSING INTR AMPL: 21.25 mV
MDC IDC SESS DTM: 20180530165840
MDC IDC SET LEADCHNL LV PACING AMPLITUDE: 4.5 V
MDC IDC SET LEADCHNL LV PACING PULSEWIDTH: 1.5 ms
MDC IDC SET LEADCHNL RA PACING AMPLITUDE: 1.5 V
MDC IDC STAT BRADY AP VP PERCENT: 33.95 %
MDC IDC STAT BRADY AS VP PERCENT: 53.21 %

## 2016-12-03 LAB — BASIC METABOLIC PANEL
BUN / CREAT RATIO: 17 (ref 10–24)
BUN: 20 mg/dL (ref 8–27)
CHLORIDE: 97 mmol/L (ref 96–106)
CO2: 24 mmol/L (ref 18–29)
Calcium: 9.4 mg/dL (ref 8.6–10.2)
Creatinine, Ser: 1.2 mg/dL (ref 0.76–1.27)
GFR calc Af Amer: 63 mL/min/{1.73_m2} (ref >59)
GFR calc non Af Amer: 54 mL/min/{1.73_m2} — ABNORMAL LOW (ref >59)
GLUCOSE: 121 mg/dL — AB (ref 65–99)
Potassium: 4.1 mmol/L (ref 3.5–5.2)
SODIUM: 138 mmol/L (ref 134–144)

## 2016-12-03 LAB — CBC
Hematocrit: 33.6 % — ABNORMAL LOW (ref 37.5–51.0)
Hemoglobin: 10.3 g/dL — ABNORMAL LOW (ref 13.0–17.7)
MCH: 23.6 pg — ABNORMAL LOW (ref 26.6–33.0)
MCHC: 30.7 g/dL — ABNORMAL LOW (ref 31.5–35.7)
MCV: 77 fL — ABNORMAL LOW (ref 79–97)
PLATELETS: 153 10*3/uL (ref 150–379)
RBC: 4.37 x10E6/uL (ref 4.14–5.80)
RDW: 19.8 % — AB (ref 12.3–15.4)
WBC: 8.9 10*3/uL (ref 3.4–10.8)

## 2016-12-03 MED ORDER — DIGOXIN 250 MCG PO TABS: 0.2500 mg | ORAL_TABLET | Freq: Every day | ORAL | 3 refills | Status: DC

## 2016-12-03 NOTE — Patient Instructions (Signed)
Medication Instructions: Dr Sallyanne Kuster has recommended making the following medication changes: 1. START Digoxin 0.25 mg - take 1 tablet by mouth daily 2. STOP Spironolactone  Labwork: Your physician recommends that you return for lab work TODAY.  Testing/Procedures: NONE ORDERED  Follow-up: Your physician recommends that you schedule a follow-up appointment in 1 month with Chanetta Marshall, NP.  Dr Sallyanne Kuster recommends that you schedule a follow-up appointment first available. You will receive a reminder letter in the mail two months in advance. If you don't receive a letter, please call our office to schedule the follow-up appointment.  If you need a refill on your cardiac medications before your next appointment, please call your pharmacy.

## 2016-12-22 ENCOUNTER — Telehealth: Payer: Self-pay | Admitting: Cardiovascular Disease

## 2016-12-22 NOTE — Telephone Encounter (Signed)
Called the patient's wife back, per DPR. She stated that the patient has been having periods of diarrhea, tiredness and fatigue since starting the Digoxin 0.25 mg daily. Per pharmd, the patient should hold the medication for two days and then start Digoxin 0.125 mg (half a tablet). He has an appointment with Chanetta Marshall, Pa on 6/29. Patient's wife verbalized her understanding and was instructed to call back if needed.

## 2016-12-22 NOTE — Telephone Encounter (Signed)
Pt c/o medication issue:  1. Name of Medication: digoxin   2. How are you currently taking this medication (dosage and times per day)? 250mg  1xday  3. Are you having a reaction (difficulty breathing--STAT)? no  4. What is your medication issue? Weak, loss of appetite, diarrhea, fatigue

## 2016-12-25 ENCOUNTER — Other Ambulatory Visit: Payer: Self-pay | Admitting: Cardiovascular Disease

## 2016-12-26 ENCOUNTER — Ambulatory Visit (INDEPENDENT_AMBULATORY_CARE_PROVIDER_SITE_OTHER): Payer: Medicare Other

## 2016-12-26 DIAGNOSIS — Z950 Presence of cardiac pacemaker: Secondary | ICD-10-CM | POA: Diagnosis not present

## 2016-12-26 DIAGNOSIS — I5022 Chronic systolic (congestive) heart failure: Secondary | ICD-10-CM | POA: Diagnosis not present

## 2016-12-26 NOTE — Progress Notes (Signed)
EPIC Encounter for ICM Monitoring  Patient Name: Dakota Liu is a 83 y.o. male Date: 12/26/2016 Primary Care Physican: Christain Sacramento, MD Primary Cardiologist:[place] Electrophysiologist: [place] Dry Weight:unknown   Clinical Status (03-Dec-2016 to 26-Dec-2016) V. Pacing 76.9%  Lower Rate 60 bpm AT/AF 1 episode  Atrial Pacing <0.1%  Upper Rate 130 bpm Time in AT/AF 24.0 hr/day (100.0%)  Observations (3) (03-Dec-2016 to 26-Dec-2016)  24 days with more than 6 hr AT/AF.  V. Pacing less than 90%.  Patient Activity less than 1 hr/day for 3 weeks.     Heart Failure questions reviewed, pt asymptomatic for fluid symptoms.  He stated since stating digoxin he is feeling really tired but the dosage has been cut in half so he is feeling a little better.   Thoracic impedance abnormal suggesting fluid accumulation since 12/05/2016.  Wife reported patient follows low salt diet but when discussing types of foods he eats it is not low salt diet.  Prescribed dosage: Furosemide 20 mg 2 tablets (40 mg total) daily. Potassium 20 mEq 2 tablets (40 mEq total) daily.   Labs: 12/03/2016 Creatinine 1.20, BUN 20, Potassium 4.1, Sodium 138, EGFR 54-63 07/16/2016 Creatinine 0.98, BUN 18, Potassium 4.2, Sodium 138  Recommendations: Advised to increase Furosemide 20 mg 2 tablets (40 mg total) to twice a day x 2 days and Potassium 20 mEq to 2 tablets (40 mEq total) in AM and 1 tablet (20 mEq total) in PM. After 2nd day resume prescribed dosages and advised if he has any side effects from taking extra Furosemide such as dizziness or lightheadedness to go back to prescribed dosage.   Follow-up plan: ICM clinic phone appointment on 01/08/2017 since he has a defib office appointment scheduled with Chanetta Marshall, NP on 01/01/2017.  Copy of ICM check sent to Dr Sallyanne Kuster for review and if additional recommendations will call back.   3 month ICM trend: 12/26/2016  AT/AF     1 Year ICM trend:      Rosalene Billings, RN 12/26/2016 10:24 AM

## 2016-12-28 NOTE — Progress Notes (Signed)
Electrophysiology Office Note Date: 01/01/2017  ID:  DEAUNTE DENTE, DOB [birth year], MRN [id number]  PCP: Christain Sacramento, MD Primary Cardiologist: Croitoru  CC: Pacemaker/AF follow-up  Dakota Liu is a 83 y.o. male seen today for Dr Sallyanne Kuster.  He presents today for routine electrophysiology followup. He has a history of persistent atrial fibrillation as well as anemia which limits ability for long term anticoagulation. He has been able to take short term anticoagulation in the past. He has reverted to AF with decrease in CRT pacing. At last office visit with Dr C,  Digoxin was started.  Since last being seen in our clinic, the patient reports doing increasingly poorly.  He does not have a good appetite. He has had worsening shortness of breath and has fallen a couple of times. He has not injured himself. He says that his "legs just give out".  He is clear today that he is not interested in invasive procedures. He just wants to feel better.  He denies chest pain, palpitations, PND, orthopnea, nausea, vomiting, dizziness, syncope, edema, weight gain, or early satiety.  Device History: MDT CRTD implanted 2011 for ICM, CHF; downgrade to CRTP 2017   Past Medical History:  Diagnosis Date  . Anemia, iron deficiency 11/21/2011  . CAD (coronary artery disease) '88, 12/11, 3/12   CABG'88, PCI 12/1, 3/12  . CHF (congestive heart failure) ([place])   . Dyslipidemia   . Epididymitis   . HTN (hypertension)   . Ischemic cardiomyopathy 10/14   EF improved to 40-45% after BiV ICD  . LBBB (left bundle branch block)   . Lymphoma ([place])    with metal radiation about 2006  . PAF (paroxysmal atrial fibrillation) ([place]) 12/11  . PVD (peripheral vascular disease) ([place]) 11/10   AAA R&G  . VT (ventricular tachycardia) ([place]) 12/11   Past Surgical History:  Procedure Laterality Date  . ABDOMINAL AORTIC ANEURYSM REPAIR  11/10  . CARDIAC DEFIBRILLATOR PLACEMENT  12/11  . CARDIOVERSION N/A 05/20/2016   Procedure: CARDIOVERSION;  Surgeon: Sanda Klein, MD;  Location: [facility] ENDOSCOPY;  Service: Cardiovascular;  Laterality: N/A;  . CORONARY ANGIOPLASTY WITH STENT PLACEMENT  07/05/10, 3/12   RCA with ISR 3/12  . CORONARY ARTERY BYPASS GRAFT  1988  . EP IMPLANTABLE DEVICE N/A 10/09/2015   Procedure: ICD to [place];  Surgeon: Sanda Klein, MD;  Location: [place] CV LAB;  Service: Cardiovascular;  Laterality: N/A;  . LEFT HEART CATHETERIZATION WITH CORONARY/GRAFT ANGIOGRAM N/A 05/03/2013   Procedure: LEFT HEART CATHETERIZATION WITH Beatrix Fetters;  Surgeon: Peter M Martinique, MD;  Location: [facility] CATH LAB;  Service: Cardiovascular;  Laterality: N/A;  . ORCHIECTOMY    . TEE WITHOUT CARDIOVERSION N/A 05/20/2016   Procedure: TRANSESOPHAGEAL ECHOCARDIOGRAM (TEE);  Surgeon: Sanda Klein, MD;  Location: [facility] ENDOSCOPY;  Service: Cardiovascular;  Laterality: N/A;  . TONSILLECTOMY      Current Outpatient Prescriptions  Medication Sig Dispense Refill  . clopidogrel (PLAVIX) 75 MG tablet Take 1 tablet (75 mg total) by mouth daily. 30 tablet 0  . digoxin (LANOXIN) 0.25 MG tablet Take 1 tablet (0.25 mg total) by mouth daily. (Patient taking differently: Take 0.125 mg by mouth daily. ) 90 tablet 3  . furosemide (LASIX) 20 MG tablet Take 2 tablets (40 mg total) by mouth daily. 60 tablet 11  . iron polysaccharides (NU-IRON) 150 MG capsule Take 1 capsule (150 mg total) by mouth 2 (two) times daily. 60 capsule 0  . Menthol, Topical Analgesic, (BENGAY  EX) Apply 1 application topically as needed (for pain).    . metoprolol succinate (TOPROL-XL) 50 MG 24 hr tablet Take 1 tablet (50 mg total) by mouth 2 (two) times daily. 180 tablet 3  . nitroGLYCERIN (NITROSTAT) 0.4 MG SL tablet Place 0.4 mg under the tongue every 5 (five) minutes as needed.      . pantoprazole (PROTONIX) 40 MG tablet Take 1 tablet (40 mg total) by mouth 2 (two) times daily. 60 tablet 0  . potassium chloride SA (KLOR-CON M20)  20 MEQ tablet Take 2 tablets (40 mEq total) by mouth daily.    . ranolazine (RANEXA) 500 MG 12 hr tablet Take 1 tablet (500 mg total) by mouth 2 (two) times daily. 60 tablet 0  . simvastatin (ZOCOR) 20 MG tablet Take 1 tablet (20 mg total) by mouth daily. 30 tablet 11   Current Facility-Administered Medications  Medication Dose Route Frequency Provider Last Rate Last Dose  . 0.9 %  sodium chloride infusion   Intravenous Once Croitoru, Mihai, MD      . 0.9 %  sodium chloride infusion   Intravenous Once Croitoru, Mihai, MD      . acetaminophen (TYLENOL) tablet 650 mg  650 mg Oral Once Croitoru, Mihai, MD      . furosemide (LASIX) injection 40 mg  40 mg Intravenous Once Croitoru, Mihai, MD        Allergies:   Tramadol; Ace inhibitors; Codeine; and Simvastatin   Social History: Social History   Social History  . Marital status: Married    Spouse name: N/A  . Number of children: N/A  . Years of education: N/A   Occupational History  . Not on file.   Social History Main Topics  . Smoking status: Former Smoker    Packs/day: 1.00    Years: 44.00    Types: Cigarettes    Quit date: 07/07/1984  . Smokeless tobacco: Never Used  . Alcohol use No  . Drug use: No  . Sexual activity: Not on file   Other Topics Concern  . Not on file   Social History Narrative  . No narrative on file    Family History: Family History  Problem Relation Age of Onset  . Diabetes Mother   . Heart Problems Mother   . Stroke Father   . Cancer Sister      Review of Systems: All other systems reviewed and are otherwise negative except as noted above.   Physical Exam: VS:  BP 124/78   Pulse 83   Ht 6\' 2"  (1.88 m)   Wt 171 lb (77.6 kg)   SpO2 94%   BMI 21.96 kg/m  , BMI Body mass index is 21.96 kg/m.  GEN- The patient is elderly and chronically ill appearing, alert and oriented x 3 today.   HEENT: normocephalic, atraumatic; sclera clear, conjunctiva pink; hearing intact; oropharynx clear; neck  supple  Lungs- Clear to ausculation bilaterally, normal work of breathing.  No wheezes, rales, rhonchi Heart- Regular rate and rhythm (paced) GI- soft, non-tender, non-distended, bowel sounds present  Extremities- no clubbing, cyanosis, or edema  MS- no significant deformity or atrophy Skin- warm and dry, no rash or lesion; PPM pocket well healed Psych- euthymic mood, full affect Neuro- strength and sensation are intact  PPM Interrogation- reviewed in detail today,  See PACEART report  EKG:  EKG is not ordered today.  Recent Labs: 05/22/2016: B Natriuretic Peptide 273.1 07/16/2016: ALT 6 12/03/2016: BUN 20; Creatinine, Ser 1.20; Hemoglobin  10.3; Platelets 153; Potassium 4.1; Sodium 138   Wt Readings from Last 3 Encounters:  01/01/17 171 lb (77.6 kg)  12/03/16 170 lb (77.1 kg)  07/16/16 171 lb (77.6 kg)     Other studies Reviewed: Additional studies/ records that were reviewed today include: Dr Lurline Del office notes  Assessment and Plan:  1.  Persistent atrial fibrillation Burden by device interrogation 100% since last seen by Dr C V rates controlled He has had worsening HF symptoms since last being seen by Dr C.  I think that he would benefit from restoration of SR.  I also think he will require AAD therapy to maintain SR (amiodarone likely only option).   CHADS2VASC is 6.  With advanced age, he is not a Watchman candidate and is clear that he wants to avoid invasive procedures. I will discuss with Dr C on Monday. I think that we should restart Eliquis and start amiodarone followed by TEE/DCCV.  If Dr C agrees, I will call patient next week to schedule CBC, BMP, BNP, Dig level today   2.  Chronic systolic heart failure Normal CRTP function See Pace Art report No changes today CRT 76%  3.  CAD s/p CABG No recent ischemic symptoms Question if we could discontinue Plavix  4.  Anemia Stable by recent labs    Current medicines are reviewed at length with the patient today.     The patient does not have concerns regarding his medicines.  The following changes were made today:  none  Labs/ tests ordered today include:  Orders Placed This Encounter  Procedures  . CUP PACEART INCLINIC DEVICE CHECK     Disposition:   Follow up with me in 2 weeks     Signed, Chanetta Marshall, NP 01/01/2017 12:43 PM  [place] [address] [phone] (office) [phone] (fax)

## 2017-01-01 ENCOUNTER — Ambulatory Visit (INDEPENDENT_AMBULATORY_CARE_PROVIDER_SITE_OTHER): Payer: Medicare Other | Admitting: Nurse Practitioner

## 2017-01-01 ENCOUNTER — Encounter: Payer: Self-pay | Admitting: Nurse Practitioner

## 2017-01-01 ENCOUNTER — Encounter: Payer: Self-pay | Admitting: *Deleted

## 2017-01-01 VITALS — BP 124/78 | HR 83 | Ht 74.0 in | Wt 171.0 lb

## 2017-01-01 DIAGNOSIS — I5022 Chronic systolic (congestive) heart failure: Secondary | ICD-10-CM

## 2017-01-01 DIAGNOSIS — I2510 Atherosclerotic heart disease of native coronary artery without angina pectoris: Secondary | ICD-10-CM

## 2017-01-01 DIAGNOSIS — I481 Persistent atrial fibrillation: Secondary | ICD-10-CM | POA: Diagnosis not present

## 2017-01-01 DIAGNOSIS — I4819 Other persistent atrial fibrillation: Secondary | ICD-10-CM

## 2017-01-01 LAB — CUP PACEART INCLINIC DEVICE CHECK
Date Time Interrogation Session: 20180628121017
Implantable Lead Implant Date: 20111230
Implantable Lead Implant Date: 20111230
Implantable Lead Location: 753859
Implantable Lead Model: 4196
Implantable Lead Model: 6947
Implantable Pulse Generator Implant Date: 20170404
MDC IDC LEAD IMPLANT DT: 20111230
MDC IDC LEAD LOCATION: 753858
MDC IDC LEAD LOCATION: 753860

## 2017-01-01 NOTE — Patient Instructions (Signed)
Medication Instructions:  Your physician recommends that you continue on your current medications as directed. Please refer to the Current Medication list given to you today.   Labwork: Your physician recommends that you return for lab work today for BMET,CBC, PRO BNP, DIG LEVEL  Testing/Procedures:     Follow-Up Your physician recommends that you schedule a follow-up appointment in: 2 weeks with Chanetta Marshall, NP   Any Other Special Instructions Will Be Listed Below (If Applicable).     If you need a refill on your cardiac medications before your next appointment, please call your pharmacy.

## 2017-01-02 ENCOUNTER — Ambulatory Visit: Payer: Medicare Other | Admitting: Nurse Practitioner

## 2017-01-02 LAB — BASIC METABOLIC PANEL
BUN / CREAT RATIO: 14 (ref 10–24)
BUN: 13 mg/dL (ref 8–27)
CO2: 24 mmol/L (ref 20–29)
CREATININE: 0.95 mg/dL (ref 0.76–1.27)
Calcium: 9 mg/dL (ref 8.6–10.2)
Chloride: 96 mmol/L (ref 96–106)
GFR calc non Af Amer: 72 mL/min/{1.73_m2} (ref >59)
GFR, EST AFRICAN AMERICAN: 83 mL/min/{1.73_m2} (ref >59)
GLUCOSE: 106 mg/dL — AB (ref 65–99)
Potassium: 4.3 mmol/L (ref 3.5–5.2)
SODIUM: 136 mmol/L (ref 134–144)

## 2017-01-02 LAB — CBC
HEMATOCRIT: 33.2 % — AB (ref 37.5–51.0)
Hemoglobin: 10 g/dL — ABNORMAL LOW (ref 13.0–17.7)
MCH: 23.8 pg — AB (ref 26.6–33.0)
MCHC: 30.1 g/dL — AB (ref 31.5–35.7)
MCV: 79 fL (ref 79–97)
PLATELETS: 129 10*3/uL — AB (ref 150–379)
RBC: 4.2 x10E6/uL (ref 4.14–5.80)
RDW: 22.3 % — AB (ref 12.3–15.4)
WBC: 6.6 10*3/uL (ref 3.4–10.8)

## 2017-01-02 LAB — PRO B NATRIURETIC PEPTIDE: NT-Pro BNP: 3145 pg/mL — ABNORMAL HIGH (ref 0–486)

## 2017-01-02 LAB — DIGOXIN LEVEL: Digoxin, Serum: 2.5 ng/mL (ref 0.5–0.9)

## 2017-01-05 ENCOUNTER — Other Ambulatory Visit: Payer: Self-pay | Admitting: Nurse Practitioner

## 2017-01-05 ENCOUNTER — Telehealth: Payer: Self-pay | Admitting: Nurse Practitioner

## 2017-01-05 ENCOUNTER — Other Ambulatory Visit: Payer: Self-pay

## 2017-01-05 MED ORDER — METOPROLOL SUCCINATE ER 50 MG PO TB24: 50.0000 mg | ORAL_TABLET | Freq: Two times a day (BID) | ORAL | 3 refills | Status: DC

## 2017-01-05 MED ORDER — AMIODARONE HCL 200 MG PO TABS: 200.0000 mg | ORAL_TABLET | Freq: Two times a day (BID) | ORAL | 3 refills | Status: DC

## 2017-01-05 MED ORDER — APIXABAN 5 MG PO TABS: 5.0000 mg | ORAL_TABLET | Freq: Two times a day (BID) | ORAL | 0 refills | Status: DC

## 2017-01-05 NOTE — Progress Notes (Signed)
Can only take anticoagulation very brief periods due to anemia, but I agree TEE-DCCV appears to be our only option. Can try after he loads on amiodarone for a couple of weeks, maybe? Start Eliquis 3 days before DCCV and stop it in 3 weeks? Stop Plavix when we start Eliquis. Thanks! MCr

## 2017-01-05 NOTE — Telephone Encounter (Signed)
Discussed with Dr C - Will plan to start amiodarone 200mg  twice daily today.  Restart Eliquis on Thursday, stop Plavix at that time.  TEE/DCCV scheduled for Monday, July 9th at [place] of the above reviewed with wife.  She states that patient is no worse but no better since I saw him last week.  She is aware of ER precautions. She states they have stopped Digoxin.  Chanetta Marshall, NP 01/05/2017 2:40 PM

## 2017-01-08 ENCOUNTER — Telehealth: Payer: Self-pay

## 2017-01-08 ENCOUNTER — Ambulatory Visit (INDEPENDENT_AMBULATORY_CARE_PROVIDER_SITE_OTHER): Payer: Medicare Other

## 2017-01-08 ENCOUNTER — Telehealth: Payer: Self-pay | Admitting: Cardiology

## 2017-01-08 DIAGNOSIS — Z950 Presence of cardiac pacemaker: Secondary | ICD-10-CM

## 2017-01-08 DIAGNOSIS — I5022 Chronic systolic (congestive) heart failure: Secondary | ICD-10-CM

## 2017-01-08 NOTE — Telephone Encounter (Signed)
-----   Message from Riley Kill, [place] sent at 01/06/2017  9:08 AM EDT ----- Regarding: FW: call patient 7/5 please   ----- Message ----- From: Cleon Gustin, RN Sent: 01/06/2017   8:16 AM To: Riley Kill, CMA Subject: FW: call patient 7/5 please                      ----- Message ----- From: Patsey Berthold, NP Sent: 01/05/2017   2:42 PM To: [address] Triage Subject: call patient 7/5 please                        See phone note from today. Can you please call and check on patient on 7/5, make sure he has started Eliquis and stopped plavix that morning and make sure wife doesn't have any questions about TEE/DCCV for Monday with Dr C?  Thank you! Museum/gallery conservator

## 2017-01-08 NOTE — Telephone Encounter (Signed)
Confirmed remote transmission w/ pt wife.   

## 2017-01-08 NOTE — Telephone Encounter (Signed)
Patient states that he has started Eliquis this morning and has stopped taking plavix. Patient is aware that his TEE/DCCV is scheduled for 01/12/17 at 10:00AM and is aware that he needs to be there at 8:30 AM. Patient verbalized understanding and was instructed to call back with any questions.

## 2017-01-08 NOTE — Progress Notes (Signed)
EPIC Encounter for ICM Monitoring  Patient Name: Dakota Liu is a 83 y.o. male Date: 01/08/2017 Primary Care Physican: Christain Sacramento, MD Primary Cardiologist:Croitoru Electrophysiologist: Croitoru Dry Weight:unknown   Time in AT/AF 24.0 hr/day (100.0%)       Heart Failure questions reviewed, pt asymptomatic for fluid symptoms but remains tired.  He has felt a little better since visit with Amber on 01/01/2017.  TEE/DCCV scheduled for Monday, 7/9.    Thoracic impedance continues to be abnormal suggesting fluid accumulation.  Lasix and Potassium was increased x 3 days at last office visit, 01/01/2017, with Chanetta Marshall, NP  Prescribed dosage: Furosemide 20 mg 2 tablets (40 mg total) daily. Potassium 20 mEq 2 tablets (40 mEq total) daily.   Labs: 01/01/2017 Creatinine 0.95, BUN 13, Potassium 4.3, Sodium 136, EGFR 72-83, BNP 3,145 12/03/2016 Creatinine 1.20, BUN 20, Potassium 4.1, Sodium 138, EGFR 54-63 07/16/2016 Creatinine 0.98, BUN 18, Potassium 4.2, Sodium 138  Recommendations: Furosemide has been increased twice, 2 and 3 days at a time since 6/22 but impedance continues to suggest fluid accumulation.    Follow-up plan: ICM clinic phone appointment on 01/22/2017 to recheck fluid levels.  Office appointment scheduled 01/15/2017 with Manfred Shirts, NP and 02/19/2017 with Dr Sallyanne Kuster.   Copy of ICM check sent to Dr Sallyanne Kuster for review and any recommendations since patient has procedure scheduled for 01/12/2017.     3 month ICM trend: 01/08/2017   1 Year ICM trend:      Rosalene Billings, RN 01/08/2017 12:50 PM

## 2017-01-08 NOTE — Progress Notes (Signed)
Thank you :)

## 2017-01-12 ENCOUNTER — Ambulatory Visit (HOSPITAL_BASED_OUTPATIENT_CLINIC_OR_DEPARTMENT_OTHER)
Admission: RE | Admit: 2017-01-12 | Discharge: 2017-01-12 | Disposition: A | Discharge status: Home / Self Care | Payer: Medicare Other | Source: Ambulatory Visit | Attending: Nurse Practitioner | Admitting: Nurse Practitioner

## 2017-01-12 ENCOUNTER — Ambulatory Visit (HOSPITAL_COMMUNITY): Payer: Medicare Other | Admitting: Anesthesiology

## 2017-01-12 ENCOUNTER — Encounter (HOSPITAL_COMMUNITY): Payer: Self-pay | Admitting: *Deleted

## 2017-01-12 ENCOUNTER — Ambulatory Visit (HOSPITAL_COMMUNITY)
Admission: RE | Admit: 2017-01-12 | Discharge: 2017-01-12 | Disposition: A | Discharge status: Home / Self Care | Payer: Medicare Other | Source: Ambulatory Visit | Attending: Cardiovascular Disease | Admitting: Cardiovascular Disease

## 2017-01-12 ENCOUNTER — Encounter (HOSPITAL_COMMUNITY)
Admission: RE | Disposition: A | Discharge status: Home / Self Care | Payer: Self-pay | Source: Ambulatory Visit | Attending: Cardiovascular Disease

## 2017-01-12 DIAGNOSIS — Z8572 Personal history of non-Hodgkin lymphomas: Secondary | ICD-10-CM | POA: Diagnosis not present

## 2017-01-12 DIAGNOSIS — Z9181 History of falling: Secondary | ICD-10-CM | POA: Diagnosis not present

## 2017-01-12 DIAGNOSIS — Z7902 Long term (current) use of antithrombotics/antiplatelets: Secondary | ICD-10-CM | POA: Insufficient documentation

## 2017-01-12 DIAGNOSIS — I340 Nonrheumatic mitral (valve) insufficiency: Secondary | ICD-10-CM | POA: Diagnosis not present

## 2017-01-12 DIAGNOSIS — I4891 Unspecified atrial fibrillation: Secondary | ICD-10-CM

## 2017-01-12 DIAGNOSIS — Z79899 Other long term (current) drug therapy: Secondary | ICD-10-CM | POA: Insufficient documentation

## 2017-01-12 DIAGNOSIS — I480 Paroxysmal atrial fibrillation: Secondary | ICD-10-CM | POA: Diagnosis not present

## 2017-01-12 DIAGNOSIS — D649 Anemia, unspecified: Secondary | ICD-10-CM | POA: Insufficient documentation

## 2017-01-12 DIAGNOSIS — I5022 Chronic systolic (congestive) heart failure: Secondary | ICD-10-CM | POA: Diagnosis not present

## 2017-01-12 DIAGNOSIS — Z951 Presence of aortocoronary bypass graft: Secondary | ICD-10-CM | POA: Insufficient documentation

## 2017-01-12 DIAGNOSIS — Z95810 Presence of automatic (implantable) cardiac defibrillator: Secondary | ICD-10-CM | POA: Diagnosis not present

## 2017-01-12 DIAGNOSIS — I110 Hypertensive heart disease with heart failure: Secondary | ICD-10-CM | POA: Insufficient documentation

## 2017-01-12 DIAGNOSIS — I739 Peripheral vascular disease, unspecified: Secondary | ICD-10-CM | POA: Insufficient documentation

## 2017-01-12 DIAGNOSIS — E785 Hyperlipidemia, unspecified: Secondary | ICD-10-CM | POA: Insufficient documentation

## 2017-01-12 DIAGNOSIS — Z955 Presence of coronary angioplasty implant and graft: Secondary | ICD-10-CM | POA: Diagnosis not present

## 2017-01-12 DIAGNOSIS — I2510 Atherosclerotic heart disease of native coronary artery without angina pectoris: Secondary | ICD-10-CM | POA: Diagnosis not present

## 2017-01-12 DIAGNOSIS — Z923 Personal history of irradiation: Secondary | ICD-10-CM | POA: Insufficient documentation

## 2017-01-12 DIAGNOSIS — Z87891 Personal history of nicotine dependence: Secondary | ICD-10-CM | POA: Insufficient documentation

## 2017-01-12 DIAGNOSIS — I255 Ischemic cardiomyopathy: Secondary | ICD-10-CM | POA: Diagnosis not present

## 2017-01-12 DIAGNOSIS — I481 Persistent atrial fibrillation: Secondary | ICD-10-CM

## 2017-01-12 DIAGNOSIS — I4819 Other persistent atrial fibrillation: Secondary | ICD-10-CM

## 2017-01-12 HISTORY — PX: CARDIOVERSION: SHX1299

## 2017-01-12 HISTORY — PX: TEE WITHOUT CARDIOVERSION: SHX5443

## 2017-01-12 LAB — POCT I-STAT 4, (NA,K, GLUC, HGB,HCT)
GLUCOSE: 133 mg/dL — AB (ref 65–99)
HCT: 29 % — ABNORMAL LOW (ref 39.0–52.0)
HEMOGLOBIN: 9.9 g/dL — AB (ref 13.0–17.0)
POTASSIUM: 4 mmol/L (ref 3.5–5.1)
Sodium: 137 mmol/L (ref 135–145)

## 2017-01-12 SURGERY — ECHOCARDIOGRAM, TRANSESOPHAGEAL
Anesthesia: General

## 2017-01-12 MED ORDER — BUTAMBEN-TETRACAINE-BENZOCAINE 2-2-14 % EX AERO
INHALATION_SPRAY | CUTANEOUS | Status: DC | PRN
  Administered 2017-01-12: 1 via TOPICAL

## 2017-01-12 MED ORDER — LACTATED RINGERS IV SOLN
INTRAVENOUS | Status: DC | PRN
  Administered 2017-01-12: 10:00:00 via INTRAVENOUS

## 2017-01-12 MED ORDER — SODIUM CHLORIDE 0.9 % IV SOLN: INTRAVENOUS | Status: DC

## 2017-01-12 MED ORDER — LIDOCAINE HCL (PF) 2 % IJ SOLN
INTRAMUSCULAR | Status: DC | PRN
  Administered 2017-01-12: 80 mg via INTRADERMAL

## 2017-01-12 MED ORDER — PROPOFOL 500 MG/50ML IV EMUL
INTRAVENOUS | Status: DC | PRN
  Administered 2017-01-12: 75 ug/kg/min via INTRAVENOUS

## 2017-01-12 NOTE — H&P (View-Only) (Signed)
Electrophysiology Office Note Date: 01/01/2017  ID:  Dakota Liu, DOB 21-Jul-1927, MRN [id number]  PCP: Christain Sacramento, MD Primary Cardiologist: Croitoru  CC: Pacemaker/AF follow-up  Dakota Liu is a 83 y.o. male seen today for Dr Sallyanne Kuster.  He presents today for routine electrophysiology followup. He has a history of persistent atrial fibrillation as well as anemia which limits ability for long term anticoagulation. He has been able to take short term anticoagulation in the past. He has reverted to AF with decrease in CRT pacing. At last office visit with Dr C,  Digoxin was started.  Since last being seen in our clinic, the patient reports doing increasingly poorly.  He does not have a good appetite. He has had worsening shortness of breath and has fallen a couple of times. He has not injured himself. He says that his "legs just give out".  He is clear today that he is not interested in invasive procedures. He just wants to feel better.  He denies chest pain, palpitations, PND, orthopnea, nausea, vomiting, dizziness, syncope, edema, weight gain, or early satiety.  Device History: MDT CRTD implanted 2011 for ICM, CHF; downgrade to CRTP 2017   Past Medical History:  Diagnosis Date  . Anemia, iron deficiency 11/21/2011  . CAD (coronary artery disease) '88, 12/11, 3/12   CABG'88, PCI 12/1, 3/12  . CHF (congestive heart failure) ([place])   . Dyslipidemia   . Epididymitis   . HTN (hypertension)   . Ischemic cardiomyopathy 10/14   EF improved to 40-45% after BiV ICD  . LBBB (left bundle branch block)   . Lymphoma ([place])    with metal radiation about 2006  . PAF (paroxysmal atrial fibrillation) ([place]) 12/11  . PVD (peripheral vascular disease) ([place]) 11/10   AAA R&G  . VT (ventricular tachycardia) ([place]) 12/11   Past Surgical History:  Procedure Laterality Date  . ABDOMINAL AORTIC ANEURYSM REPAIR  11/10  . CARDIAC DEFIBRILLATOR PLACEMENT  12/11  . CARDIOVERSION N/A 05/20/2016   Procedure: CARDIOVERSION;  Surgeon: Sanda Klein, MD;  Location: [facility] ENDOSCOPY;  Service: Cardiovascular;  Laterality: N/A;  . CORONARY ANGIOPLASTY WITH STENT PLACEMENT  07/05/10, 3/12   RCA with ISR 3/12  . CORONARY ARTERY BYPASS GRAFT  1988  . EP IMPLANTABLE DEVICE N/A 10/09/2015   Procedure: ICD to [place];  Surgeon: Sanda Klein, MD;  Location: [place] CV LAB;  Service: Cardiovascular;  Laterality: N/A;  . LEFT HEART CATHETERIZATION WITH CORONARY/GRAFT ANGIOGRAM N/A 05/03/2013   Procedure: LEFT HEART CATHETERIZATION WITH Beatrix Fetters;  Surgeon: Peter M Martinique, MD;  Location: [facility] CATH LAB;  Service: Cardiovascular;  Laterality: N/A;  . ORCHIECTOMY    . TEE WITHOUT CARDIOVERSION N/A 05/20/2016   Procedure: TRANSESOPHAGEAL ECHOCARDIOGRAM (TEE);  Surgeon: Sanda Klein, MD;  Location: [facility] ENDOSCOPY;  Service: Cardiovascular;  Laterality: N/A;  . TONSILLECTOMY      Current Outpatient Prescriptions  Medication Sig Dispense Refill  . clopidogrel (PLAVIX) 75 MG tablet Take 1 tablet (75 mg total) by mouth daily. 30 tablet 0  . digoxin (LANOXIN) 0.25 MG tablet Take 1 tablet (0.25 mg total) by mouth daily. (Patient taking differently: Take 0.125 mg by mouth daily. ) 90 tablet 3  . furosemide (LASIX) 20 MG tablet Take 2 tablets (40 mg total) by mouth daily. 60 tablet 11  . iron polysaccharides (NU-IRON) 150 MG capsule Take 1 capsule (150 mg total) by mouth 2 (two) times daily. 60 capsule 0  . Menthol, Topical Analgesic, (BENGAY  EX) Apply 1 application topically as needed (for pain).    . metoprolol succinate (TOPROL-XL) 50 MG 24 hr tablet Take 1 tablet (50 mg total) by mouth 2 (two) times daily. 180 tablet 3  . nitroGLYCERIN (NITROSTAT) 0.4 MG SL tablet Place 0.4 mg under the tongue every 5 (five) minutes as needed.      . pantoprazole (PROTONIX) 40 MG tablet Take 1 tablet (40 mg total) by mouth 2 (two) times daily. 60 tablet 0  . potassium chloride SA (KLOR-CON M20)  20 MEQ tablet Take 2 tablets (40 mEq total) by mouth daily.    . ranolazine (RANEXA) 500 MG 12 hr tablet Take 1 tablet (500 mg total) by mouth 2 (two) times daily. 60 tablet 0  . simvastatin (ZOCOR) 20 MG tablet Take 1 tablet (20 mg total) by mouth daily. 30 tablet 11   Current Facility-Administered Medications  Medication Dose Route Frequency Provider Last Rate Last Dose  . 0.9 %  sodium chloride infusion   Intravenous Once Croitoru, Mihai, MD      . 0.9 %  sodium chloride infusion   Intravenous Once Croitoru, Mihai, MD      . acetaminophen (TYLENOL) tablet 650 mg  650 mg Oral Once Croitoru, Mihai, MD      . furosemide (LASIX) injection 40 mg  40 mg Intravenous Once Croitoru, Mihai, MD        Allergies:   Tramadol; Ace inhibitors; Codeine; and Simvastatin   Social History: Social History   Social History  . Marital status: Married    Spouse name: N/A  . Number of children: N/A  . Years of education: N/A   Occupational History  . Not on file.   Social History Main Topics  . Smoking status: Former Smoker    Packs/day: 1.00    Years: 44.00    Types: Cigarettes    Quit date: 07/07/1984  . Smokeless tobacco: Never Used  . Alcohol use No  . Drug use: No  . Sexual activity: Not on file   Other Topics Concern  . Not on file   Social History Narrative  . No narrative on file    Family History: Family History  Problem Relation Age of Onset  . Diabetes Mother   . Heart Problems Mother   . Stroke Father   . Cancer Sister      Review of Systems: All other systems reviewed and are otherwise negative except as noted above.   Physical Exam: VS:  BP 124/78   Pulse 83   Ht 6\' 2"  (1.88 m)   Wt 171 lb (77.6 kg)   SpO2 94%   BMI 21.96 kg/m  , BMI Body mass index is 21.96 kg/m.  GEN- The patient is elderly and chronically ill appearing, alert and oriented x 3 today.   HEENT: normocephalic, atraumatic; sclera clear, conjunctiva pink; hearing intact; oropharynx clear; neck  supple  Lungs- Clear to ausculation bilaterally, normal work of breathing.  No wheezes, rales, rhonchi Heart- Regular rate and rhythm (paced) GI- soft, non-tender, non-distended, bowel sounds present  Extremities- no clubbing, cyanosis, or edema  MS- no significant deformity or atrophy Skin- warm and dry, no rash or lesion; PPM pocket well healed Psych- euthymic mood, full affect Neuro- strength and sensation are intact  PPM Interrogation- reviewed in detail today,  See PACEART report  EKG:  EKG is not ordered today.  Recent Labs: 05/22/2016: B Natriuretic Peptide 273.1 07/16/2016: ALT 6 12/03/2016: BUN 20; Creatinine, Ser 1.20; Hemoglobin  10.3; Platelets 153; Potassium 4.1; Sodium 138   Wt Readings from Last 3 Encounters:  01/01/17 171 lb (77.6 kg)  12/03/16 170 lb (77.1 kg)  07/16/16 171 lb (77.6 kg)     Other studies Reviewed: Additional studies/ records that were reviewed today include: Dr Lurline Del office notes  Assessment and Plan:  1.  Persistent atrial fibrillation Burden by device interrogation 100% since last seen by Dr C V rates controlled He has had worsening HF symptoms since last being seen by Dr C.  I think that he would benefit from restoration of SR.  I also think he will require AAD therapy to maintain SR (amiodarone likely only option).   CHADS2VASC is 6.  With advanced age, he is not a Watchman candidate and is clear that he wants to avoid invasive procedures. I will discuss with Dr C on Monday. I think that we should restart Eliquis and start amiodarone followed by TEE/DCCV.  If Dr C agrees, I will call patient next week to schedule CBC, BMP, BNP, Dig level today   2.  Chronic systolic heart failure Normal CRTP function See Pace Art report No changes today CRT 76%  3.  CAD s/p CABG No recent ischemic symptoms Question if we could discontinue Plavix  4.  Anemia Stable by recent labs    Current medicines are reviewed at length with the patient today.     The patient does not have concerns regarding his medicines.  The following changes were made today:  none  Labs/ tests ordered today include:  Orders Placed This Encounter  Procedures  . CUP PACEART INCLINIC DEVICE CHECK     Disposition:   Follow up with me in 2 weeks     Signed, Chanetta Marshall, NP 01/01/2017 12:43 PM  [place] [address] [phone] (office) [phone] (fax)

## 2017-01-12 NOTE — Discharge Instructions (Signed)
Electrical Cardioversion, Care After °This sheet gives you information about how to care for yourself after your procedure. Your health care provider may also give you more specific instructions. If you have problems or questions, contact your health care provider. °What can I expect after the procedure? °After the procedure, it is common to have: °· Some redness on the skin where the shocks were given. ° °Follow these instructions at home: °· Do not drive for 24 hours if you were given a medicine to help you relax (sedative). °· Take over-the-counter and prescription medicines only as told by your health care provider. °· Ask your health care provider how to check your pulse. Check it often. °· Rest for 48 hours after the procedure or as told by your health care provider. °· Avoid or limit your caffeine use as told by your health care provider. °Contact a health care provider if: °· You feel like your heart is beating too quickly or your pulse is not regular. °· You have a serious muscle cramp that does not go away. °Get help right away if: °· You have discomfort in your chest. °· You are dizzy or you feel faint. °· You have trouble breathing or you are short of breath. °· Your speech is slurred. °· You have trouble moving an arm or leg on one side of your body. °· Your fingers or toes turn cold or blue. °This information is not intended to replace advice given to you by your health care provider. Make sure you discuss any questions you have with your health care provider. °Document Released: 04/13/2013 Document Revised: 01/25/2016 Document Reviewed: 12/28/2015 °Elsevier Interactive Patient Education © 2018 [employer] ° °

## 2017-01-12 NOTE — Anesthesia Procedure Notes (Signed)
Procedure Name: MAC Date/Time: 01/12/2017 9:54 AM Performed by: Teressa Lower Pre-anesthesia Checklist: Patient identified, Emergency Drugs available, Suction available, Timeout performed and Patient being monitored Oxygen Delivery Method: Nasal cannula

## 2017-01-12 NOTE — Progress Notes (Signed)
  Echocardiogram Echocardiogram Transesophageal has been performed.  Dakota Liu T Keshia Weare 01/12/2017, 10:40 AM

## 2017-01-12 NOTE — Op Note (Signed)
Procedure: Electrical Cardioversion Indications:  Atrial Fibrillation  Procedure Details:  Consent: Risks of procedure as well as the alternatives and risks of each were explained to the (patient/caregiver).  Consent for procedure obtained.  Time Out: Verified patient identification, verified procedure, site/side was marked, verified correct patient position, special equipment/implants available, medications/allergies/relevent history reviewed, required imaging and test results available.  Performed  Patient placed on cardiac monitor, pulse oximetry, supplemental oxygen as necessary.  Sedation given: IV propofol Pacer pads placed anterior and posterior chest.  Cardioverted 1 time(s).  Cardioversion with synchronized biphasic 150J shock.  Evaluation: Findings: Post procedure EKG shows: A paced, V paced rhythm Complications: None Patient did tolerate procedure well.  Time Spent Directly with the Patient:  30 minutes   Dakota Liu 01/12/2017, 10:12 AM

## 2017-01-12 NOTE — Anesthesia Preprocedure Evaluation (Signed)
Anesthesia Evaluation  Patient identified by MRN, date of birth, ID band Patient awake    Reviewed: Allergy & Precautions, NPO status , Patient's Chart, lab work & pertinent test results  Airway Mallampati: II  TM Distance: >3 FB Neck ROM: Full    Dental  (+) Edentulous Upper, Partial Lower, Dental Advisory Given   Pulmonary former smoker,    Pulmonary exam normal breath sounds clear to auscultation       Cardiovascular hypertension,  Rhythm:Irregular Rate:Normal     Neuro/Psych    GI/Hepatic   Endo/Other    Renal/GU      Musculoskeletal   Abdominal Normal abdominal exam  (+)   Peds  Hematology   Anesthesia Other Findings Dakota Liu  ECHO TEE (ENDO) W COLOR AND SPECT  Order# [id number]  Reading physician: Sanda Klein, MD Ordering physician: Sanda Klein, MD Study date: 05/20/16 Study Result   Result status: Final result                             *[facility]*                         [address], [place] [address]                            [phone]  ------------------------------------------------------------------- Transesophageal Echocardiography  Patient:    Dakota Liu, Dakota Liu MR #:       [id number] Study Date: 05/20/2016 Gender:     M Age:        83 Height: Weight: BSA: Pt. Status: Room:   ADMITTING    Sanda Klein, MD  [place] Croitoru, MD  [place] Croitoru, MD  [place] Croitoru, MD  REFERRING    Sanda Klein, MD  SONOGRAPHER  Leavy Cella  cc:  ------------------------------------------------------------------- LV EF: 30% -   35%  ------------------------------------------------------------------- Indications:      Atrial fibrillation - 427.31.  ------------------------------------------------------------------- History:   PMH:  LBBB,  Former Smoker.  Coronary artery disease. Risk factors:  Dyslipidemia.  ------------------------------------------------------------------- Study Conclusions  - Left ventricle: The cavity size was normal. Wall thickness was   normal. Systolic function was moderately to severely reduced. The   estimated ejection fraction was in the range of 30% to 35%.   Akinesis and aneurysmal deformity of the mid-apicalanteroseptal,   anterior, and apical myocardium; consistent with infarction in   the distribution of the left anterior descending coronary artery. - Aortic valve: No evidence of vegetation. - Mitral valve: No evidence of vegetation. There was mild   regurgitation. - Left atrium: The atrium was dilated. No evidence of thrombus in   the atrial cavity or appendage. No spontaneous echo contrast was   observed. - Right atrium: No evidence of thrombus in the atrial cavity or   appendage. - Atrial septum: No defect or patent foramen ovale was identified. - Tricuspid valve: No evidence of vegetation.  ------------------------------------------------------------------- Labs, prior tests, procedures, and surgery: Coronary     Reproductive/Obstetrics  Anesthesia Physical  Anesthesia Plan  ASA: III  Anesthesia Plan: General   Post-op Pain Management:    Induction: Intravenous  PONV Risk Score and Plan:   Airway Management Planned: Mask and Natural Airway  Additional Equipment:   Intra-op Plan:   Post-operative Plan:   Informed Consent: I have reviewed the patients History and Physical, chart, labs and discussed the procedure including the risks, benefits and alternatives for the proposed anesthesia with the patient or authorized representative who has indicated his/her understanding and acceptance.   Dental advisory given  Plan Discussed with: CRNA  Anesthesia Plan Comments:         Anesthesia Quick  Evaluation

## 2017-01-12 NOTE — Op Note (Signed)
INDICATIONS: atrial fibrillation  PROCEDURE:   Informed consent was obtained prior to the procedure. The risks, benefits and alternatives for the procedure were discussed and the patient comprehended these risks.  Risks include, but are not limited to, cough, sore throat, vomiting, nausea, somnolence, esophageal and stomach trauma or perforation, bleeding, low blood pressure, aspiration, pneumonia, infection, trauma to the teeth and death.    After a procedural time-out, the oropharynx was anesthetized with 20% benzocaine spray.   During this procedure the patient was administered IV propofol by Anesthesiology.  The transesophageal probe was inserted in the esophagus and stomach without difficulty and multiple views were obtained.  The patient was kept under observation until the patient left the procedure room.  The patient left the procedure room in stable condition.   Agitated microbubble saline contrast was not administered.  COMPLICATIONS:    There were no immediate complications.  FINDINGS:  No LA thrombus. Good appendage emptying velocities. Severely depressed LVEF with regional abnormalities.  RECOMMENDATIONS:     Proceed with cardioversion.  Time Spent Directly with the Patient:  30 minutes   Dakota Liu 01/12/2017, 10:11 AM

## 2017-01-12 NOTE — Interval H&P Note (Signed)
History and Physical Interval Note:  01/12/2017 8:10 AM  Dakota Liu  has presented today for surgery, with the diagnosis of Atrial Fibrillation  The various methods of treatment have been discussed with the patient and family. After consideration of risks, benefits and other options for treatment, the patient has consented to  Procedure(s): TRANSESOPHAGEAL ECHOCARDIOGRAM (TEE) (N/A) CARDIOVERSION (N/A) as a surgical intervention .  The patient's history has been reviewed, patient examined, no change in status, stable for surgery.  I have reviewed the patient's chart and labs.  Questions were answered to the patient's satisfaction.     Kaleyah Labreck

## 2017-01-12 NOTE — Transfer of Care (Signed)
Immediate Anesthesia Transfer of Care Note  Patient: Dakota Liu  Procedure(s) Performed: Procedure(s): TRANSESOPHAGEAL ECHOCARDIOGRAM (TEE) (N/A) CARDIOVERSION (N/A)  Patient Location: Endoscopy Unit  Anesthesia Type:MAC  Level of Consciousness: awake, alert  and oriented  Airway & Oxygen Therapy: Patient Spontanous Breathing  Post-op Assessment: Report given to RN and Post -op Vital signs reviewed and stable  Post vital signs: Reviewed and stable  Last Vitals:  Vitals:   01/12/17 0835  BP: (!) 123/42  Pulse: 82  Resp: 20  Temp: 36.8 C    Last Pain:  Vitals:   01/12/17 0835  TempSrc: Oral         Complications: No apparent anesthesia complications

## 2017-01-13 ENCOUNTER — Encounter (HOSPITAL_COMMUNITY): Payer: Self-pay | Admitting: Cardiovascular Disease

## 2017-01-13 NOTE — Progress Notes (Deleted)
Electrophysiology Office Note Date: 01/13/2017  ID:  Dakota Liu, DOB [birth year], MRN [id number]  PCP: Christain Sacramento, MD Primary Cardiologist: Croitoru  CC: Pacemaker/AF follow-up  Dakota Liu is a 83 y.o. male seen today for Dr Sallyanne Kuster.  He presents today for routine electrophysiology followup. He has a history of persistent atrial fibrillation as well as anemia which limits ability for long term anticoagulation. He has been able to take short term anticoagulation in the past. He has reverted to AF with decrease in CRT pacing. At last office visit with Dr C,  Digoxin was started.  Since last being seen in our clinic, the patient reports doing increasingly poorly.  He does not have a good appetite. He has had worsening shortness of breath and has fallen a couple of times. He has not injured himself. He says that his "legs just give out".  He is clear today that he is not interested in invasive procedures. He just wants to feel better.  He denies chest pain, palpitations, PND, orthopnea, nausea, vomiting, dizziness, syncope, edema, weight gain, or early satiety.  Device History: MDT CRTD implanted 2011 for ICM, CHF; downgrade to CRTP 2017   Past Medical History:  Diagnosis Date  . Anemia, iron deficiency 11/21/2011  . CAD (coronary artery disease) '88, 12/11, 3/12   CABG'88, PCI 12/1, 3/12  . CHF (congestive heart failure) ([place])   . Dyslipidemia   . Epididymitis   . HTN (hypertension)   . Ischemic cardiomyopathy 10/14   EF improved to 40-45% after BiV ICD  . LBBB (left bundle branch block)   . Lymphoma ([place])    with metal radiation about 2006  . PAF (paroxysmal atrial fibrillation) ([place]) 12/11  . PVD (peripheral vascular disease) ([place]) 11/10   AAA R&G  . VT (ventricular tachycardia) ([place]) 12/11   Past Surgical History:  Procedure Laterality Date  . ABDOMINAL AORTIC ANEURYSM REPAIR  11/10  . CARDIAC DEFIBRILLATOR PLACEMENT  12/11  . CARDIOVERSION N/A 05/20/2016   Procedure: CARDIOVERSION;  Surgeon: Sanda Klein, MD;  Location: [facility] ENDOSCOPY;  Service: Cardiovascular;  Laterality: N/A;  . CORONARY ANGIOPLASTY WITH STENT PLACEMENT  07/05/10, 3/12   RCA with ISR 3/12  . CORONARY ARTERY BYPASS GRAFT  1988  . EP IMPLANTABLE DEVICE N/A 10/09/2015   Procedure: ICD to [place];  Surgeon: Sanda Klein, MD;  Location: [facility] CV LAB;  Service: Cardiovascular;  Laterality: N/A;  . LEFT HEART CATHETERIZATION WITH CORONARY/GRAFT ANGIOGRAM N/A 05/03/2013   Procedure: LEFT HEART CATHETERIZATION WITH Beatrix Fetters;  Surgeon: Peter M Martinique, MD;  [facility] CATH LAB;  Service: Cardiovascular;  Laterality: N/A;  . ORCHIECTOMY    . TEE WITHOUT CARDIOVERSION N/A 05/20/2016   Procedure: TRANSESOPHAGEAL ECHOCARDIOGRAM (TEE);  Surgeon: Sanda Klein, MD;  Location: [facility] ENDOSCOPY;  Service: Cardiovascular;  Laterality: N/A;  . TONSILLECTOMY      Current Outpatient Prescriptions  Medication Sig Dispense Refill  . amiodarone (PACERONE) 200 MG tablet Take 1 tablet (200 mg total) by mouth 2 (two) times daily. 180 tablet 3  . apixaban (ELIQUIS) 5 MG TABS tablet Take 1 tablet (5 mg total) by mouth 2 (two) times daily. 60 tablet 0  . furosemide (LASIX) 20 MG tablet Take 2 tablets (40 mg total) by mouth daily. 60 tablet 11  . iron polysaccharides (NU-IRON) 150 MG capsule Take 1 capsule (150 mg total) by mouth 2 (two) times daily. 60 capsule 0  . Menthol, Topical Analgesic, (BENGAY EX) Apply 1  application topically as needed (for pain).    . metoprolol succinate (TOPROL-XL) 50 MG 24 hr tablet Take 1 tablet (50 mg total) by mouth 2 (two) times daily. 180 tablet 3  . nitroGLYCERIN (NITROSTAT) 0.4 MG SL tablet Place 0.4 mg under the tongue every 5 (five) minutes as needed.      . pantoprazole (PROTONIX) 40 MG tablet Take 1 tablet (40 mg total) by mouth 2 (two) times daily. 60 tablet 0  . potassium chloride SA (KLOR-CON M20) 20 MEQ tablet Take 2 tablets  (40 mEq total) by mouth daily.    . ranolazine (RANEXA) 500 MG 12 hr tablet Take 1 tablet (500 mg total) by mouth 2 (two) times daily. 60 tablet 0  . simvastatin (ZOCOR) 20 MG tablet Take 1 tablet (20 mg total) by mouth daily. 30 tablet 11   Current Facility-Administered Medications  Medication Dose Route Frequency Provider Last Rate Last Dose  . 0.9 %  sodium chloride infusion   Intravenous Once Croitoru, Mihai, MD      . 0.9 %  sodium chloride infusion   Intravenous Once Croitoru, Mihai, MD      . acetaminophen (TYLENOL) tablet 650 mg  650 mg Oral Once Croitoru, Mihai, MD      . furosemide (LASIX) injection 40 mg  40 mg Intravenous Once Croitoru, Mihai, MD        Allergies:   Tramadol; Ace inhibitors; Codeine; and Simvastatin   Social History: Social History   Social History  . Marital status: Married    Spouse name: N/A  . Number of children: N/A  . Years of education: N/A   Occupational History  . Not on file.   Social History Main Topics  . Smoking status: Former Smoker    Packs/day: 1.00    Years: 44.00    Types: Cigarettes    Quit date: 07/07/1984  . Smokeless tobacco: Never Used  . Alcohol use No  . Drug use: No  . Sexual activity: Not on file   Other Topics Concern  . Not on file   Social History Narrative  . No narrative on file    Family History: Family History  Problem Relation Age of Onset  . Diabetes Mother   . Heart Problems Mother   . Stroke Father   . Cancer Sister      Review of Systems: All other systems reviewed and are otherwise negative except as noted above.   Physical Exam: VS:  There were no vitals taken for this visit. , BMI There is no height or weight on file to calculate BMI.  GEN- The patient is elderly and chronically ill appearing, alert and oriented x 3 today.   HEENT: normocephalic, atraumatic; sclera clear, conjunctiva pink; hearing intact; oropharynx clear; neck supple  Lungs- Clear to ausculation bilaterally, normal  work of breathing.  No wheezes, rales, rhonchi Heart- Regular rate and rhythm (paced) GI- soft, non-tender, non-distended, bowel sounds present  Extremities- no clubbing, cyanosis, or edema  MS- no significant deformity or atrophy Skin- warm and dry, no rash or lesion; PPM pocket well healed Psych- euthymic mood, full affect Neuro- strength and sensation are intact  PPM Interrogation- reviewed in detail today,  See PACEART report  EKG:  EKG is not ordered today.  Recent Labs: 05/22/2016: B Natriuretic Peptide 273.1 07/16/2016: ALT 6 01/01/2017: BUN 13; Creatinine, Ser 0.95; NT-Pro BNP 3,145; Platelets 129 01/12/2017: Hemoglobin 9.9; Potassium 4.0; Sodium 137   Wt Readings from Last 3 Encounters:  01/01/17 171 lb (77.6 kg)  12/03/16 170 lb (77.1 kg)  07/16/16 171 lb (77.6 kg)     Other studies Reviewed: Additional studies/ records that were reviewed today include: Dr Lurline Del office notes  Assessment and Plan:  1.  Persistent atrial fibrillation [place] by device interrogation 100% since last seen by Dr C V rates controlled He has had worsening HF symptoms since last being seen by Dr C.  I think that he would benefit from restoration of SR.  I also think he will require AAD therapy to maintain SR (amiodarone likely only option).   CHADS2VASC is 6.  With advanced age, he is not a Watchman candidate and is clear that he wants to avoid invasive procedures. I will discuss with Dr C on Monday. I think that we should restart Eliquis and start amiodarone followed by TEE/DCCV.  If Dr C agrees, I will call patient next week to schedule CBC, BMP, BNP, Dig level today   2.  Chronic systolic heart failure Normal CRTP function See Pace Art report No changes today CRT 76%  3.  CAD s/p CABG No recent ischemic symptoms Question if we could discontinue Plavix  4.  Anemia Stable by recent labs    Current medicines are reviewed at length with the patient today.   The patient does not have  concerns regarding his medicines.  The following changes were made today:  none  Labs/ tests ordered today include:  No orders of the defined types were placed in this encounter.    Disposition:   Follow up with me in 2 weeks     Signed, Chanetta Marshall, NP 01/13/2017 10:00 AM  [place] [address] [phone] (office) [phone] (fax)

## 2017-01-14 NOTE — Progress Notes (Signed)
Electrophysiology Office Note Date: 01/15/2017  ID:  Dakota Liu, DOB 02-Apr-1928, MRN [id number]  PCP: Christain Sacramento, MD Primary Cardiologist: Croitoru  CC: Pacemaker/AF follow-up  Dakota Liu is a 83 y.o. male seen today for Dr Sallyanne Kuster.  He presents today for routine electrophysiology followup. He has a history of persistent atrial fibrillation as well as anemia which limits ability for long term anticoagulation. He has been able to take short term anticoagulation in the past. He has reverted to AF with decrease in CRT pacing. At last office visit with Dr C,  Digoxin was started. When I saw him last, the patient reported doing increasingly poorly. After discussion with Dr C, he was started on amiodarone and [place] and underwent TEE guided DCCV last week. Since that time, is significantly improved. He can tell a difference in shortness of breath, LE edema, and exercise tolerance. He denies chest pain, palpitations, PND, orthopnea, nausea, vomiting, dizziness, syncope, edema, weight gain, or early satiety.  Device History: MDT CRTD implanted 2011 for ICM, CHF; downgrade to CRTP 2017   Past Medical History:  Diagnosis Date  . Anemia, iron deficiency 11/21/2011  . CAD (coronary artery disease) '88, 12/11, 3/12   CABG'88, PCI 12/1, 3/12  . CHF (congestive heart failure) ([place])   . Dyslipidemia   . Epididymitis   . HTN (hypertension)   . Ischemic cardiomyopathy 10/14   EF improved to 40-45% after BiV ICD  . LBBB (left bundle branch block)   . Lymphoma ([place])    with metal radiation about 2006  . PAF (paroxysmal atrial fibrillation) ([place]) 12/11  . PVD (peripheral vascular disease) ([place]) 11/10   AAA R&G  . VT (ventricular tachycardia) ([place]) 12/11   Past Surgical History:  Procedure Laterality Date  . ABDOMINAL AORTIC ANEURYSM REPAIR  11/10  . CARDIAC DEFIBRILLATOR PLACEMENT  12/11  . CARDIOVERSION N/A 05/20/2016   Procedure: CARDIOVERSION;  Surgeon: Sanda Klein, MD;   Location: [facility] ENDOSCOPY;  Service: Cardiovascular;  Laterality: N/A;  . CARDIOVERSION N/A 01/12/2017   Procedure: CARDIOVERSION;  Surgeon: Sanda Klein, MD;  Location: [facility];  Service: Cardiovascular;  Laterality: N/A;  . CORONARY ANGIOPLASTY WITH STENT PLACEMENT  07/05/10, 3/12   RCA with ISR 3/12  . CORONARY ARTERY BYPASS GRAFT  1988  . EP IMPLANTABLE DEVICE N/A 10/09/2015   Procedure: ICD to [place];  Surgeon: Sanda Klein, MD;  Location: [facility] CV LAB;  Service: Cardiovascular;  Laterality: N/A;  . LEFT HEART CATHETERIZATION WITH CORONARY/GRAFT ANGIOGRAM N/A 05/03/2013   Procedure: LEFT HEART CATHETERIZATION WITH Beatrix Fetters;  Surgeon: Peter M Martinique, MD;  Location: [facility] CATH LAB;  Service: Cardiovascular;  Laterality: N/A;  . ORCHIECTOMY    . TEE WITHOUT CARDIOVERSION N/A 05/20/2016   Procedure: TRANSESOPHAGEAL ECHOCARDIOGRAM (TEE);  Surgeon: Sanda Klein, MD;  Location: [facility] ENDOSCOPY;  Service: Cardiovascular;  Laterality: N/A;  . TEE WITHOUT CARDIOVERSION N/A 01/12/2017   Procedure: TRANSESOPHAGEAL ECHOCARDIOGRAM (TEE);  Surgeon: Sanda Klein, MD;  Location: [facility] ENDOSCOPY;  Service: Cardiovascular;  Laterality: N/A;  . TONSILLECTOMY      Current Outpatient Prescriptions  Medication Sig Dispense Refill  . amiodarone (PACERONE) 200 MG tablet Take 1 tablet (200 mg total) by mouth 2 (two) times daily. 180 tablet 3  . apixaban (ELIQUIS) 5 MG TABS tablet Take 1 tablet (5 mg total) by mouth 2 (two) times daily. 60 tablet 0  . furosemide (LASIX) 20 MG tablet Take 2 tablets (40 mg total) by mouth daily. [place]  tablet 11  . iron polysaccharides (NU-IRON) 150 MG capsule Take 1 capsule (150 mg total) by mouth 2 (two) times daily. 60 capsule 0  . Menthol, Topical Analgesic, (BENGAY EX) Apply 1 application topically as needed (for pain).    . metoprolol succinate (TOPROL-XL) 50 MG 24 hr tablet Take 1 tablet (50 mg total) by mouth 2 (two) times daily. 180 tablet 3   . nitroGLYCERIN (NITROSTAT) 0.4 MG SL tablet Place 0.4 mg under the tongue every 5 (five) minutes as needed.      . pantoprazole (PROTONIX) 40 MG tablet Take 1 tablet (40 mg total) by mouth 2 (two) times daily. 60 tablet 0  . potassium chloride SA (KLOR-CON M20) 20 MEQ tablet Take 2 tablets (40 mEq total) by mouth daily.    . ranolazine (RANEXA) 500 MG 12 hr tablet Take 1 tablet (500 mg total) by mouth 2 (two) times daily. 60 tablet 0  . simvastatin (ZOCOR) 20 MG tablet Take 1 tablet (20 mg total) by mouth daily. 30 tablet 11   Current Facility-Administered Medications  Medication Dose Route Frequency Provider Last Rate Last Dose  . 0.9 %  sodium chloride infusion   Intravenous Once Croitoru, Mihai, MD      . 0.9 %  sodium chloride infusion   Intravenous Once Croitoru, Mihai, MD      . acetaminophen (TYLENOL) tablet 650 mg  650 mg Oral Once Croitoru, Mihai, MD      . furosemide (LASIX) injection 40 mg  40 mg Intravenous Once Croitoru, Mihai, MD        Allergies:   Tramadol; Ace inhibitors; Codeine; and Simvastatin   Social History: Social History   Social History  . Marital status: Married    Spouse name: N/A  . Number of children: N/A  . Years of education: N/A   Occupational History  . Not on file.   Social History Main Topics  . Smoking status: Former Smoker    Packs/day: 1.00    Years: 44.00    Types: Cigarettes    Quit date: 07/07/1984  . Smokeless tobacco: Never Used  . Alcohol use No  . Drug use: No  . Sexual activity: Not on file   Other Topics Concern  . Not on file   Social History Narrative  . No narrative on file    Family History: Family History  Problem Relation Age of Onset  . Diabetes Mother   . Heart Problems Mother   . Stroke Father   . Cancer Sister      Review of Systems: All other systems reviewed and are otherwise negative except as noted above.   Physical Exam: VS:  BP (!) 109/58   Pulse 65   Ht 6\' 2"  (1.88 m)   Wt 170 lb 2 oz  (77.2 kg)   SpO2 96%   BMI 21.84 kg/m  , BMI Body mass index is 21.84 kg/m.  GEN- The patient is elderly and chronically ill appearing, alert and oriented x 3 today.   HEENT: normocephalic, atraumatic; sclera clear, conjunctiva pink; hearing intact; oropharynx clear; neck supple  Lungs- Clear to ausculation bilaterally, normal work of breathing.  No wheezes, rales, rhonchi Heart- Regular rate and rhythm (paced) GI- soft, non-tender, non-distended, bowel sounds present  Extremities- no clubbing, cyanosis, or edema  MS- no significant deformity or atrophy Skin- warm and dry, no rash or lesion; PPM pocket well healed Psych- euthymic mood, full affect Neuro- strength and sensation are intact  PPM Interrogation-  reviewed in detail today,  See PACEART report  EKG:  EKG is not ordered today.  Recent Labs: 05/22/2016: B Natriuretic Peptide 273.1 07/16/2016: ALT 6 01/01/2017: BUN 13; Creatinine, Ser 0.95; NT-Pro BNP 3,145; Platelets 129 01/12/2017: Hemoglobin 9.9; Potassium 4.0; Sodium 137   Wt Readings from Last 3 Encounters:  01/15/17 170 lb 2 oz (77.2 kg)  01/01/17 171 lb (77.6 kg)  12/03/16 170 lb (77.1 kg)     Other studies Reviewed: Additional studies/ records that were reviewed today include: Dr Lurline Del office notes  Assessment and Plan:  1.  Persistent atrial fibrillation Maintaining SR since TEE/DCCV Continue amiodarone 200mg  twice daily for now. Consider decreasing to 200mg  daily at 4 week follow up. Will need LFT's, TSH in 4 weeks.  Continue Eliquis for now with CHADS2VASC of 6.  With advanced age, he is not a Watchman candidate and is clear that he wants to avoid invasive procedures.  2.  Chronic systolic heart failure Normal CRTP function See Pace Art report No changes today CRT 99.7% since TEE/DCCV  3.  CAD s/p CABG No recent ischemic symptoms  4.  Anemia Stable by recent labs  Will need repeat CBC in 4 weeks Advised to watch for recurrent bleeding   Current  medicines are reviewed at length with the patient today.   The patient does not have concerns regarding his medicines.  The following changes were made today:  none  Labs/ tests ordered today include: none Orders Placed This Encounter  Procedures  . CUP PACEART INCLINIC DEVICE CHECK     Disposition:   Follow up with Dr C in 4 weeks   Signed, Chanetta Marshall, NP 01/15/2017 8:26 AM  [place] [address] [place] [address] [phone] (office) [phone] (fax)

## 2017-01-15 ENCOUNTER — Encounter: Payer: Medicare Other | Admitting: Nurse Practitioner

## 2017-01-15 ENCOUNTER — Encounter: Payer: Self-pay | Admitting: Nurse Practitioner

## 2017-01-15 ENCOUNTER — Ambulatory Visit (INDEPENDENT_AMBULATORY_CARE_PROVIDER_SITE_OTHER): Payer: Medicare Other | Admitting: Nurse Practitioner

## 2017-01-15 VITALS — BP 109/58 | HR 65 | Ht 74.0 in | Wt 170.1 lb

## 2017-01-15 DIAGNOSIS — I5022 Chronic systolic (congestive) heart failure: Secondary | ICD-10-CM

## 2017-01-15 DIAGNOSIS — D500 Iron deficiency anemia secondary to blood loss (chronic): Secondary | ICD-10-CM | POA: Diagnosis not present

## 2017-01-15 DIAGNOSIS — I481 Persistent atrial fibrillation: Secondary | ICD-10-CM | POA: Diagnosis not present

## 2017-01-15 DIAGNOSIS — I2510 Atherosclerotic heart disease of native coronary artery without angina pectoris: Secondary | ICD-10-CM

## 2017-01-15 DIAGNOSIS — I4819 Other persistent atrial fibrillation: Secondary | ICD-10-CM

## 2017-01-15 LAB — CUP PACEART INCLINIC DEVICE CHECK
Date Time Interrogation Session: 20180712080607
Implantable Lead Implant Date: 20111230
Implantable Lead Implant Date: 20111230
Implantable Lead Implant Date: 20111230
Implantable Lead Location: 753858
Implantable Lead Location: 753859
Implantable Lead Location: 753860
Implantable Lead Model: 4196
Implantable Lead Model: 5076
Implantable Lead Model: 6947
Implantable Pulse Generator Implant Date: 20170404

## 2017-01-15 NOTE — Patient Instructions (Signed)
Medication Instructions:  Your physician recommends that you continue on your current medications as directed. Please refer to the Current Medication list given to you today.   Labwork: None Ordered   Testing/Procedures: None Ordered   Follow-Up: Your physician recommends that you schedule a follow-up appointment in: 4 weeks with Dr. Sallyanne Kuster  Any Other Special Instructions Will Be Listed Below (If Applicable).     If you need a refill on your cardiac medications before your next appointment, please call your pharmacy.

## 2017-01-15 NOTE — Anesthesia Postprocedure Evaluation (Signed)
Anesthesia Post Note  Patient: Dakota Liu  Procedure(s) Performed: Procedure(s) (LRB): TRANSESOPHAGEAL ECHOCARDIOGRAM (TEE) (N/A) CARDIOVERSION (N/A)     Patient location during evaluation: PACU Anesthesia Type: MAC Level of consciousness: awake Pain management: pain level controlled Vital Signs Assessment: post-procedure vital signs reviewed and stable Respiratory status: spontaneous breathing Cardiovascular status: stable Postop Assessment: no signs of nausea or vomiting Anesthetic complications: no    Last Vitals:  Vitals:   01/12/17 1015 01/12/17 1030  BP: 131/63 125/65  Pulse: 80 60  Resp: (!) 21 19  Temp: 36.4 C     Last Pain:  Vitals:   01/12/17 1015  TempSrc: Oral   Pain Goal:                 Pookela Sellin JR,JOHN Daveyon Kitchings

## 2017-01-15 NOTE — Progress Notes (Signed)
Thanks again MCr 

## 2017-01-22 ENCOUNTER — Telehealth: Payer: Self-pay

## 2017-01-22 ENCOUNTER — Ambulatory Visit (INDEPENDENT_AMBULATORY_CARE_PROVIDER_SITE_OTHER): Payer: Self-pay

## 2017-01-22 DIAGNOSIS — Z950 Presence of cardiac pacemaker: Secondary | ICD-10-CM

## 2017-01-22 DIAGNOSIS — I5022 Chronic systolic (congestive) heart failure: Secondary | ICD-10-CM

## 2017-01-22 NOTE — Progress Notes (Signed)
EPIC Encounter for ICM Monitoring  Patient Name: Dakota Liu is a 83 y.o. male Date: 01/22/2017 Primary Care Physican: Christain Sacramento, MD Primary Cardiologist:Croitoru Electrophysiologist: Croitoru Dry Weight:unknown Time in AT/AF 0.0 hr/day (0.0%) Since 15-Jan-2017      Spoke with patient and wife. Heart Failure questions reviewed, pt symptomatic with swelling of feet and legs.   Thoracic impedance abnormal suggesting fluid accumulation since 12/28/2016.   Wife stated she thinks patient has limited salt intake to 2000 mg.   Prescribed dosage: Furosemide 20 mg 2 tablets (40 mg total) daily. Potassium 20 mEq 2 tablets (40 mEq total) daily.   Labs: 01/01/2017 Creatinine 0.95, BUN 13, Potassium 4.3, Sodium 136, EGFR 72-83, BNP 3,145 12/03/2016 Creatinine 1.20, BUN 20, Potassium 4.1, Sodium 138, EGFR 54-63 07/16/2016 Creatinine 0.98, BUN 18, Potassium 4.2, Sodium 138  Recommendations: Advised to increase Furosemide 20 mg to 2 (40 mg total) tablets in AM and 2 tablets (40 mg total) in PM x 2 days and to increase Potassium 20 mEq to 2 tablets (40 mEq total) in AM and 1 tablet in PM x 2 days.  After 2nd day return to prescribed dosages.  Advised if he has any difficulty such as dizziness with taking extra dosages then return to prescribed dosage.   Follow-up plan: ICM clinic phone appointment on 01/26/2017 (pacemaker, manual send). Office appointment scheduled 02/19/2017 with Dr. Sallyanne Kuster.  Copy of ICM check sent to Dr Sallyanne Kuster for review.   3 month ICM trend: 01/22/2017   1 Year ICM trend:      Rosalene Billings, RN 01/22/2017 9:40 AM

## 2017-01-22 NOTE — Progress Notes (Signed)
Agree with increased diuretic. Seems to be maintaining normal rhythm, presumably better cardiac resynchronization, but not getting better

## 2017-01-22 NOTE — Telephone Encounter (Signed)
Remote ICM transmission received.  Attempted patient call and no voice mail option

## 2017-01-26 ENCOUNTER — Ambulatory Visit (INDEPENDENT_AMBULATORY_CARE_PROVIDER_SITE_OTHER): Payer: Medicare Other

## 2017-01-26 DIAGNOSIS — Z950 Presence of cardiac pacemaker: Secondary | ICD-10-CM

## 2017-01-26 DIAGNOSIS — I5022 Chronic systolic (congestive) heart failure: Secondary | ICD-10-CM

## 2017-01-26 NOTE — Progress Notes (Signed)
EPIC Encounter for ICM Monitoring  Patient Name: Dakota Liu is a 83 y.o. male Date: 01/26/2017 Primary Care Physican: Christain Sacramento, MD Primary Cardiologist:Croitoru Electrophysiologist: Croitoru Dry Weight:unknown Time in AT/AF 0.0 hr/day (0.0%)       Heart Failure questions reviewed, pt reported he can tell he has lost some fluid.  He said he has less leg swelling and he overall feels better.   Thoracic impedance improved after increasing Furosemide to 40 mg bid x 2 days on 01/22/2017.  Prescribed dosage: Furosemide 20 mg 2 tablets (40 mg total) daily. Potassium 20 mEq 2 tablets (40 mEq total) daily.   Labs: 01/01/2017 Creatinine 0.95, BUN 13, Potassium 4.3, Sodium 136, EGFR 72-83, BNP 3,145 12/03/2016 Creatinine 1.20, BUN 20, Potassium 4.1, Sodium 138, EGFR 54-63 07/16/2016 Creatinine 0.98, BUN 18, Potassium 4.2, Sodium 138  Recommendations: No changes.  He said he has really cut back on the salt and is trying very hard to stick to it.   Encouraged to call for fluid symptoms.  Follow-up plan: ICM clinic phone appointment on 02/05/2017 to recheck fluid levels.  Office appointment scheduled 02/19/2017 with Dr. Sallyanne Kuster.  Copy of ICM check sent to Dr Sallyanne Kuster and advised if any recommendations will call him back.    3 month ICM trend: 01/26/2017   1 Year ICM trend:      Rosalene Billings, RN 01/26/2017 11:31 AM

## 2017-01-27 NOTE — Progress Notes (Signed)
Agree, thank you MCr 

## 2017-02-05 ENCOUNTER — Ambulatory Visit (INDEPENDENT_AMBULATORY_CARE_PROVIDER_SITE_OTHER): Payer: Self-pay

## 2017-02-05 DIAGNOSIS — I5022 Chronic systolic (congestive) heart failure: Secondary | ICD-10-CM

## 2017-02-05 DIAGNOSIS — Z950 Presence of cardiac pacemaker: Secondary | ICD-10-CM

## 2017-02-05 NOTE — Progress Notes (Signed)
EPIC Encounter for ICM Monitoring  Patient Name: Dakota Liu is a 83 y.o. male Date: 02/05/2017 Primary Care Physican: Christain Sacramento, MD Primary Cardiologist:Croitoru Electrophysiologist: Croitoru Dry Weight:unknown Time in AT/AF 0.0 hr/day (0.0%)        Heart Failure questions reviewed, pt asymptomatic.   Thoracic impedance continues to be abnormal suggesting fluid accumulation since 12/28/2016.  Prescribed dosage: Furosemide 20 mg 2 tablets (40 mg total) daily. Potassium 20 mEq 2 tablets (40 mEq total) daily.   Labs: 01/01/2017 Creatinine 0.95, BUN 13, Potassium 4.3, Sodium 136, EGFR 72-83, BNP 3,145 12/03/2016 Creatinine 1.20, BUN 20, Potassium 4.1, Sodium 138, EGFR 54-63 07/16/2016 Creatinine 0.98, BUN 18, Potassium 4.2, Sodium 138   Recommendations: Advised to increase Furosemide 20 mg to 2 (40 mg total) tablets in AM and 2 tablets (40 mg total) in PM x 2 days and to increase Potassium 20 mEq to 2 tablets (40 mEq total) in AM and 1 tablet in PM x 2 days.  After 2nd day return to prescribed dosages.  Follow-up plan: ICM clinic phone appointment on 02/10/2017 to recheck fluid levels.  Office appointment scheduled 02/19/2017 with Dr. Sallyanne Kuster.  Copy of ICM check sent to Dr Sallyanne Kuster for review and any further recommendations.    3 month ICM trend: 02/05/2017   AT/AF   1 Year ICM trend:      Rosalene Billings, RN 02/05/2017 9:04 AM

## 2017-02-06 NOTE — Progress Notes (Signed)
Agree. Thanks MCr 

## 2017-02-10 ENCOUNTER — Telehealth: Payer: Self-pay

## 2017-02-10 ENCOUNTER — Ambulatory Visit (INDEPENDENT_AMBULATORY_CARE_PROVIDER_SITE_OTHER): Payer: Self-pay

## 2017-02-10 DIAGNOSIS — I5022 Chronic systolic (congestive) heart failure: Secondary | ICD-10-CM

## 2017-02-10 DIAGNOSIS — Z950 Presence of cardiac pacemaker: Secondary | ICD-10-CM

## 2017-02-10 NOTE — Progress Notes (Signed)
EPIC Encounter for ICM Monitoring  Patient Name: Dakota Liu is a 83 y.o. male Date: 02/10/2017 Primary Care Physican: Christain Sacramento, MD Primary Cardiologist:Croitoru Electrophysiologist: Croitoru Dry Weight:unknown Time in AT/AF 0.0 hr/day (0.0%)      Heart Failure questions reviewed, pt has some leg swelling that occurs during the day and resolves overnight.   Thoracic impedance slightly improved after taking extra Furosemide x 2 days.  Furosemide has been increased twice for 2 days at a time on 7/19 and 8/2 with slight improvement.  Prescribed dosage: Furosemide 20 mg 2 tablets (40 mg total) daily. Potassium 20 mEq 2 tablets (40 mEq total) daily.   Labs: 01/01/2017 Creatinine 0.95, BUN 13, Potassium 4.3, Sodium 136, EGFR 72-83, BNP 3,145 12/03/2016 Creatinine 1.20, BUN 20, Potassium 4.1, Sodium 138, EGFR 54-63 07/16/2016 Creatinine 0.98, BUN 18, Potassium 4.2, Sodium 138  Recommendations:   Patient reported his urination only increases a small amount when taking extra Furosemide.  Advised if Dr Sallyanne Kuster has any recommendations will call him back.   Encouraged to call for fluid symptoms.  Follow-up plan: ICM clinic phone appointment on 03/23/2017.  Office appointment scheduled 02/19/2017 with Dr. Sallyanne Kuster.  Copy of ICM check sent to Dr Sallyanne Kuster.   3 month ICM trend: 02/10/2017   1 Year ICM trend:      Rosalene Billings, RN 02/10/2017 2:16 PM

## 2017-02-10 NOTE — Telephone Encounter (Signed)
LMOVM requesting that pt send manual transmission 

## 2017-02-11 ENCOUNTER — Other Ambulatory Visit: Payer: Self-pay | Admitting: Cardiovascular Disease

## 2017-02-19 ENCOUNTER — Encounter: Payer: Self-pay | Admitting: Cardiovascular Disease

## 2017-02-19 ENCOUNTER — Ambulatory Visit (INDEPENDENT_AMBULATORY_CARE_PROVIDER_SITE_OTHER): Payer: Medicare Other | Admitting: Cardiovascular Disease

## 2017-02-19 VITALS — BP 118/60 | HR 62 | Ht 74.0 in | Wt 172.0 lb

## 2017-02-19 DIAGNOSIS — I4819 Other persistent atrial fibrillation: Secondary | ICD-10-CM

## 2017-02-19 DIAGNOSIS — D500 Iron deficiency anemia secondary to blood loss (chronic): Secondary | ICD-10-CM

## 2017-02-19 DIAGNOSIS — Z5181 Encounter for therapeutic drug level monitoring: Secondary | ICD-10-CM

## 2017-02-19 DIAGNOSIS — Z950 Presence of cardiac pacemaker: Secondary | ICD-10-CM

## 2017-02-19 DIAGNOSIS — I2510 Atherosclerotic heart disease of native coronary artery without angina pectoris: Secondary | ICD-10-CM

## 2017-02-19 DIAGNOSIS — E785 Hyperlipidemia, unspecified: Secondary | ICD-10-CM | POA: Diagnosis not present

## 2017-02-19 DIAGNOSIS — I5022 Chronic systolic (congestive) heart failure: Secondary | ICD-10-CM | POA: Diagnosis not present

## 2017-02-19 DIAGNOSIS — R0602 Shortness of breath: Secondary | ICD-10-CM

## 2017-02-19 DIAGNOSIS — I481 Persistent atrial fibrillation: Secondary | ICD-10-CM

## 2017-02-19 DIAGNOSIS — Z79899 Other long term (current) drug therapy: Secondary | ICD-10-CM | POA: Diagnosis not present

## 2017-02-19 MED ORDER — POTASSIUM CHLORIDE ER 10 MEQ PO TBCR: 10.0000 meq | EXTENDED_RELEASE_TABLET | Freq: Every day | ORAL | 3 refills | Status: AC

## 2017-02-19 MED ORDER — FUROSEMIDE 20 MG PO TABS: 20.0000 mg | ORAL_TABLET | Freq: Every day | ORAL | 3 refills | Status: DC

## 2017-02-19 NOTE — Patient Instructions (Signed)
Medication Instructions: Dr Sallyanne Kuster has recommended making the following medication changes: 1. STOP Eliquis 2. DECREASE Furosemide (Lasix) to 20 mg daily 3. DECREASE Potassium to 10 mEq daily  Labwork: Your physician recommends that you return for lab work TODAY.  Testing/Procedures: NONE ORDERED  Follow-up: Dr Sallyanne Kuster recommends that you schedule a follow-up appointment in 3 months.  If you need a refill on your cardiac medications before your next appointment, please call your pharmacy.

## 2017-02-19 NOTE — Progress Notes (Signed)
Cardiology Office Note    Date:  02/19/2017   ID:  Dakota Liu, DOB November 01, 1927, MRN [id number]  PCP:  Christain Sacramento, MD  Cardiologist:   Sanda Klein, MD   Chief Complaint  Patient presents with  . Follow-up    nbo chest pain, occasisonal shortness of breath, has edema in legs, no pain or cramping in legs, no lightheaded or dizziness    History of Present Illness:  Dakota Liu is a 83 y.o. male with a long-standing history of ischemic cardiomyopathy, well controlled systolic and diastolic heart failure, coronary artery disease with previous bypass surgery and percutaneous revascularization, PAD, recurrent persistent atrial fibrillation and history of ventricular tachycardia, CRT-P, recurrent iron deficiency anemia when placed on anticoagulation.  Recently he has had recurrent and protracted episodes of persistent atrial fibrillation that lead to worsening biventricular pacing efficiency. He underwent cardioversion last November with improvement of symptoms, but anticoagulation had to be stopped for severe anemia. Atrial fibrillation recurred in April and we tried conservative management, but he again developed heart failure and underwent cardioversion on July 9.  After each cardioversion he feels better and has less shortness of breath within a matter of days. Has been the case this time as well, but he looks a little pale again. He has not had overt melena, but describes his stool as "glue" ; his stool is black, but he is on iron supplements. He has a lot of difficulty swallowing the large K Dur tablets.  Interrogation of device today shows that he has maintained normal rhythm since his cardioversion, without any recurrence of atrial fibrillation. He has 97% atrial paced ventricular paced rhythm and 3% atrial sensed ventricular paced rhythm. Lead parameters remain excellent. Battery voltage is 2.99 V (estimated longevity 3.5 years).  He had CABG in '88 with subsequent PCI 12/11  (distal RCA stent), 3/12 ([place] angioplasty for in-stent restenosis). Last cardiac cath in October 2014 showed severe native vessel disease (70% left main, 95% ostial LAD occluded proximally, all OM branch is occluded, right coronary artery occluded proximally) but with patent grafts (LIMA to the LAD, SVG to diagonal, sequential SVG to OM1 and-1 to-13, SVG to RCA) and patent site of previous stent in the distal RCA.  He had moderate to severe ischemic cardiopathy with estimated LVEF of 30% and akinesis of the mid to distal anterior wall and mid to distal inferior wall and apical dyskinesis. He received a CRT-D Medtronic device in December 2011 with good response and an increase in LVEF to 40-45%.  He has treated hypertension and hyperlipidemia and a previous history of lymphoma and iron deficiency anemia. He wears braces on his ankles for unsteady joints.  No history of stroke/TIA or other embolic events. November 2017 underwent TEE guided electrical cardioversion for persistent atrial fibrillation and worsening CRT. Recurrent persistent atrial fibrillation April-July 2018, repeat electrical cardioversion July 9..  Past Medical History:  Diagnosis Date  . Anemia, iron deficiency 11/21/2011  . CAD (coronary artery disease) '88, 12/11, 3/12   CABG'88, PCI 12/1, 3/12  . CHF (congestive heart failure) ([place])   . Dyslipidemia   . Epididymitis   . HTN (hypertension)   . Ischemic cardiomyopathy 10/14   EF improved to 40-45% after BiV ICD  . LBBB (left bundle branch block)   . Lymphoma ([place])    with metal radiation about 2006  . PAF (paroxysmal atrial fibrillation) ([place]) 12/11  . PVD (peripheral vascular disease) ([place]) 11/10   AAA R&G  .  VT (ventricular tachycardia) ([place]) 12/11    Past Surgical History:  Procedure Laterality Date  . ABDOMINAL AORTIC ANEURYSM REPAIR  11/10  . CARDIAC DEFIBRILLATOR PLACEMENT  12/11  . CARDIOVERSION N/A 05/20/2016   Procedure: CARDIOVERSION;  Surgeon:  Sanda Klein, MD;  Location: [facility];  Service: Cardiovascular;  Laterality: N/A;  . CARDIOVERSION N/A 01/12/2017   Procedure: CARDIOVERSION;  Surgeon: Sanda Klein, MD;  Location: [facility];  Service: Cardiovascular;  Laterality: N/A;  . CORONARY ANGIOPLASTY WITH STENT PLACEMENT  07/05/10, 3/12   RCA with ISR 3/12  . CORONARY ARTERY BYPASS GRAFT  1988  . EP IMPLANTABLE DEVICE N/A 10/09/2015   Procedure: ICD to [employer];  Surgeon: Sanda Klein, MD;  Location: [facility];  Service: Cardiovascular;  Laterality: N/A;  . LEFT HEART CATHETERIZATION WITH CORONARY/GRAFT ANGIOGRAM N/A 05/03/2013   Procedure: LEFT HEART CATHETERIZATION WITH Beatrix Fetters;  Surgeon: Peter M Martinique, MD;  Location[facility] CATH LAB;  Service: Cardiovascular;  Laterality: N/A;  . ORCHIECTOMY    . TEE WITHOUT CARDIOVERSION N/A 05/20/2016   Procedure: TRANSESOPHAGEAL ECHOCARDIOGRAM (TEE);  Surgeon: Sanda Klein, MD;  Location: [facility] ENDOSCOPY;  Service: Cardiovascular;  Laterality: N/A;  . TEE WITHOUT CARDIOVERSION N/A 01/12/2017   Procedure: TRANSESOPHAGEAL ECHOCARDIOGRAM (TEE);  Surgeon: Sanda Klein, MD;  Location: [facility] ENDOSCOPY;  Service: Cardiovascular;  Laterality: N/A;  . TONSILLECTOMY      Current Medications: Outpatient Medications Prior to Visit  Medication Sig Dispense Refill  . amiodarone (PACERONE) 200 MG tablet Take 1 tablet (200 mg total) by mouth 2 (two) times daily. 180 tablet 3  . iron polysaccharides (NU-IRON) 150 MG capsule Take 1 capsule (150 mg total) by mouth 2 (two) times daily. 60 capsule 0  . Menthol, Topical Analgesic, (BENGAY EX) Apply 1 application topically as needed (for pain).    . metoprolol succinate (TOPROL-XL) 50 MG 24 hr tablet Take 1 tablet (50 mg total) by mouth 2 (two) times daily. 180 tablet 3  . nitroGLYCERIN (NITROSTAT) 0.4 MG SL tablet Place 0.4 mg under the tongue every 5 (five) minutes as needed.      . pantoprazole (PROTONIX) 40 MG  tablet Take 1 tablet (40 mg total) by mouth 2 (two) times daily. 60 tablet 0  . ranolazine (RANEXA) 500 MG 12 hr tablet Take 1 tablet (500 mg total) by mouth 2 (two) times daily. 60 tablet 0  . simvastatin (ZOCOR) 20 MG tablet TAKE 1 TABLET BY MOUTH EVERY DAY 30 tablet 11  . apixaban (ELIQUIS) 5 MG TABS tablet Take 1 tablet (5 mg total) by mouth 2 (two) times daily. 60 tablet 0  . furosemide (LASIX) 20 MG tablet Take 2 tablets (40 mg total) by mouth daily. 60 tablet 11  . potassium chloride SA (KLOR-CON M20) 20 MEQ tablet Take 2 tablets (40 mEq total) by mouth daily.     Facility-Administered Medications Prior to Visit  Medication Dose Route Frequency Provider Last Rate Last Dose  . 0.9 %  sodium chloride infusion   Intravenous Once Karrigan Messamore, MD      . 0.9 %  sodium chloride infusion   Intravenous Once Rohini Jaroszewski, MD      . acetaminophen (TYLENOL) tablet 650 mg  650 mg Oral Once Sadat Sliwa, MD      . furosemide (LASIX) injection 40 mg  40 mg Intravenous Once Takuya Lariccia, MD         Allergies:   Tramadol; Ace inhibitors; Codeine; and Simvastatin   Social History  Social History  . Marital status: Married    Spouse name: N/A  . Number of children: N/A  . Years of education: N/A   Social History Main Topics  . Smoking status: Former Smoker    Packs/day: 1.00    Years: 44.00    Types: Cigarettes    Quit date: 07/07/1984  . Smokeless tobacco: Never Used  . Alcohol use No  . Drug use: No  . Sexual activity: Not Asked   Other Topics Concern  . None   Social History Narrative  . None     Family History:  The patient's family history includes Cancer in his sister; Diabetes in his mother; Heart Problems in his mother; Stroke in his father.   ROS:   Please see the history of present illness.    ROS All other systems reviewed and are negative.   PHYSICAL EXAM:   VS:  BP 118/60   Pulse 62   Ht 6\' 2"  (1.88 m)   Wt 172 lb (78 kg)   BMI 22.08 kg/m      General: Alert, oriented x3, no distress. Pale. Head: no evidence of trauma, PERRL, EOMI, no exophtalmos or lid lag, no myxedema, no xanthelasma; normal ears, nose and oropharynx Neck: normal jugular venous pulsations and no hepatojugular reflux; brisk carotid pulses without delay and no carotid bruits Chest: clear to auscultation, no signs of consolidation by percussion or palpation, normal fremitus, symmetrical and full respiratory excursions. Healthy pacemaker site. Mild gynecomastia. Cardiovascular: normal position and quality of the apical impulse, regular rhythm, normal first and split second heart sounds, no murmurs, rubs or gallops Abdomen: no tenderness or distention, no masses by palpation, no abnormal pulsatility or arterial bruits, normal bowel sounds, no hepatosplenomegaly Extremities: no clubbing, cyanosis; 1+ right ankle edema (SVG harvest limb), no left ankle edema; 2+ radial, ulnar and brachial pulses bilaterally; 2+ right femoral, posterior tibial and dorsalis pedis pulses; 2+ left femoral, posterior tibial and dorsalis pedis pulses; no subclavian or femoral bruits Neurological: grossly nonfocal Psych: euthymic mood, full affect  Wt Readings from Last 3 Encounters:  02/19/17 172 lb (78 kg)  01/15/17 170 lb 2 oz (77.2 kg)  01/01/17 171 lb (77.6 kg)      Studies/Labs Reviewed:   EKG:  EKG is  ordered today.  It shows atrial paced-biventricular paced rhythm with prominent positive R wave in lead V1 consistent with affective LV pacing, QTC 502 ms  Recent Labs: 05/22/2016: B Natriuretic Peptide 273.1 07/16/2016: ALT 6 01/01/2017: BUN 13; Creatinine, Ser 0.95; NT-Pro BNP 3,145; Platelets 129 01/12/2017: Hemoglobin 9.9; Potassium 4.0; Sodium 137  Had hemoglobin 6.5, increased to 9.0 after 2 units transfusion  Lipid Panel Labs from August 20, 2015 show LDL cholesterol 51, total cholesterol 103, triglycerides 97, HDL 36.  ASSESSMENT:    1. Chronic systolic heart failure  (HCC)   2. Persistent atrial fibrillation ([place])   3. Encounter for monitoring amiodarone therapy   4. Biventricular cardiac pacemaker in situ   5. Iron deficiency anemia due to chronic blood loss   6. CAD s/p CABG   7. Dyslipidemia   8. Shortness of breath      PLAN:  In order of problems listed above:  1. CHF: Optivol still suggest some degree of fluid overload, but clinically he is doing well. Will decrease his diuretic dose and decrease the potassium supplement. In the past, returned to normal rhythm and avoidance of atrial fibrillation have consistently led to improvement in heart failure. We  had to stop ARB and reduce beta blockers due to hypotension. Spironolactone was stopped due to gynecomastia. 2. AFib: No recurrence following cardioversion 5 weeks ago on amiodarone. Stop anticoagulation due to problems with iron deficiency anemia. 3. Amiodarone: Check TSH and liver function tests at least every 6 months. 4. CRT-P: Poor resynchronization affect due to atrial fibrillation, markedly improved after his cardioversion. LV pacing threshold remains relatively high. Continue follow-up in the heart failure device clinic monthly.  5. Anemia :  Hemoglobin to be  rechecked today. He looks pale. Stop anticoagulation. 6. CAD s/p CABG: Does not have angina pectoris. 7. HLP: On simvastatin. Watch for increased risk of side effects in combination with amiodarone. We'll switch to pravastatin 40 mg at bedtime daily and recheck labs in 3 months..   Medication Adjustments/Labs and Tests Ordered: Current medicines are reviewed at length with the patient today.  Concerns regarding medicines are outlined above.  Medication changes, Labs and Tests ordered today are listed in the Patient Instructions below. Patient Instructions  Medication Instructions: Dr Sallyanne Kuster has recommended making the following medication changes: 1. STOP Eliquis 2. DECREASE Furosemide (Lasix) to 20 mg daily 3. DECREASE Potassium  to 10 mEq daily  Labwork: Your physician recommends that you return for lab work TODAY.  Testing/Procedures: NONE ORDERED  Follow-up: Dr Sallyanne Kuster recommends that you schedule a follow-up appointment in 3 months.  If you need a refill on your cardiac medications before your next appointment, please call your pharmacy.    Signed, Sanda Klein, MD  02/19/2017 10:24 AM    [facility] [address], [place], [place]  [address] Phone: [phone]; Fax: [phone]

## 2017-02-20 ENCOUNTER — Telehealth: Payer: Self-pay

## 2017-02-20 ENCOUNTER — Other Ambulatory Visit: Payer: Self-pay

## 2017-02-20 DIAGNOSIS — D500 Iron deficiency anemia secondary to blood loss (chronic): Secondary | ICD-10-CM

## 2017-02-20 DIAGNOSIS — I4819 Other persistent atrial fibrillation: Secondary | ICD-10-CM

## 2017-02-20 DIAGNOSIS — I5022 Chronic systolic (congestive) heart failure: Secondary | ICD-10-CM

## 2017-02-20 LAB — COMPREHENSIVE METABOLIC PANEL
A/G RATIO: 2 (ref 1.2–2.2)
ALBUMIN: 3.8 g/dL (ref 3.5–4.7)
ALT: 11 IU/L (ref 0–44)
AST: 12 IU/L (ref 0–40)
Alkaline Phosphatase: 69 IU/L (ref 39–117)
BUN / CREAT RATIO: 19 (ref 10–24)
BUN: 20 mg/dL (ref 8–27)
Bilirubin Total: 0.6 mg/dL (ref 0.0–1.2)
CALCIUM: 9 mg/dL (ref 8.6–10.2)
CO2: 25 mmol/L (ref 20–29)
CREATININE: 1.06 mg/dL (ref 0.76–1.27)
Chloride: 101 mmol/L (ref 96–106)
GFR calc Af Amer: 73 mL/min/{1.73_m2} (ref >59)
GFR, EST NON AFRICAN AMERICAN: 63 mL/min/{1.73_m2} (ref >59)
GLOBULIN, TOTAL: 1.9 g/dL (ref 1.5–4.5)
Glucose: 109 mg/dL — ABNORMAL HIGH (ref 65–99)
POTASSIUM: 4.6 mmol/L (ref 3.5–5.2)
SODIUM: 139 mmol/L (ref 134–144)
Total Protein: 5.7 g/dL — ABNORMAL LOW (ref 6.0–8.5)

## 2017-02-20 LAB — CBC
HEMATOCRIT: 23.5 % — AB (ref 37.5–51.0)
HEMOGLOBIN: 7.1 g/dL — AB (ref 13.0–17.7)
MCH: 24.3 pg — ABNORMAL LOW (ref 26.6–33.0)
MCHC: 30.2 g/dL — AB (ref 31.5–35.7)
MCV: 81 fL (ref 79–97)
Platelets: 202 10*3/uL (ref 150–379)
RBC: 2.92 x10E6/uL — ABNORMAL LOW (ref 4.14–5.80)
RDW: 20 % — ABNORMAL HIGH (ref 12.3–15.4)
WBC: 6 10*3/uL (ref 3.4–10.8)

## 2017-02-20 LAB — BRAIN NATRIURETIC PEPTIDE: BNP: 352.3 pg/mL — AB (ref 0.0–100.0)

## 2017-02-20 LAB — TSH: TSH: 6.09 u[IU]/mL — ABNORMAL HIGH (ref 0.450–4.500)

## 2017-02-20 MED ORDER — SODIUM CHLORIDE 0.9 % IV SOLN: Freq: Once | INTRAVENOUS | Status: DC

## 2017-02-20 MED ORDER — FUROSEMIDE 10 MG/ML IJ SOLN: 40.0000 mg | Freq: Once | INTRAMUSCULAR | Status: DC

## 2017-02-20 NOTE — Telephone Encounter (Signed)
-----   Message from Sanda Klein, MD sent at 02/19/2017  5:47 PM EDT ----- Hemoglobin very low again. Anticoagulation has been stopped today. TSH mildly high on amiodarone. Please type and cross and set up for one unit transfusion in short stay, 40 mg IV furosemide during transfusion. Check repeat H/H and free T4 after transfusion. Continue the iron.

## 2017-02-23 ENCOUNTER — Ambulatory Visit (HOSPITAL_COMMUNITY)
Admission: RE | Admit: 2017-02-23 | Discharge: 2017-02-23 | Disposition: A | Discharge status: Home / Self Care | Payer: Medicare Other | Source: Ambulatory Visit | Attending: Cardiovascular Disease | Admitting: Cardiovascular Disease

## 2017-02-23 ENCOUNTER — Other Ambulatory Visit: Payer: Self-pay | Admitting: *Deleted

## 2017-02-23 ENCOUNTER — Other Ambulatory Visit: Payer: Self-pay | Admitting: Cardiovascular Disease

## 2017-02-23 DIAGNOSIS — D62 Acute posthemorrhagic anemia: Secondary | ICD-10-CM

## 2017-02-23 DIAGNOSIS — D649 Anemia, unspecified: Secondary | ICD-10-CM

## 2017-02-23 LAB — PREPARE RBC (CROSSMATCH)

## 2017-02-23 LAB — T4, FREE: FREE T4: 0.95 ng/dL (ref 0.61–1.12)

## 2017-02-23 MED ORDER — FUROSEMIDE 10 MG/ML IJ SOLN: 40.0000 mg | Freq: Once | INTRAMUSCULAR | Status: DC

## 2017-02-23 MED ORDER — SODIUM CHLORIDE 0.9 % IV SOLN
Freq: Once | INTRAVENOUS | Status: AC
  Administered 2017-02-23: 13:00:00 via INTRAVENOUS

## 2017-02-23 MED ORDER — SODIUM CHLORIDE 0.9 % IV SOLN: Freq: Once | INTRAVENOUS | Status: DC

## 2017-02-23 NOTE — Telephone Encounter (Signed)
Gwen from the patient care center called into state that the patient was there for a blood transfusion but there were not any orders. The orders were released for her but she stated that she could not access them. The orders were printed and were going to be faxed over to her when it was noticed that the orders were now gone from epic.  A call was placed to the providers assistant and new orders were placed. Gwen still could not access them so they were printed and faxed to her with a message to call back if there was anything else that could be done for her.

## 2017-02-23 NOTE — Discharge Instructions (Signed)
Blood Transfusion, Care After This sheet gives you information about how to care for yourself after your procedure. Your doctor may also give you more specific instructions. If you have problems or questions, contact your doctor. Follow these instructions at home:  Take over-the-counter and prescription medicines only as told by your doctor.  Go back to your normal activities as told by your doctor.  Follow instructions from your doctor about how to take care of the area where an IV tube was put into your vein (insertion site). Make sure you: ? Wash your hands with soap and water before you change your bandage (dressing). If there is no soap and water, use hand sanitizer. ? Change your bandage as told by your doctor.  Check your IV insertion site every day for signs of infection. Check for: ? More redness, swelling, or pain. ? More fluid or blood. ? Warmth. ? Pus or a bad smell. Contact a doctor if:  You have more redness, swelling, or pain around the IV insertion site..  You have more fluid or blood coming from the IV insertion site.  Your IV insertion site feels warm to the touch.  You have pus or a bad smell coming from the IV insertion site.  Your pee (urine) turns pink, red, or brown.  You feel weak after doing your normal activities. Get help right away if:  You have signs of a serious allergic or body defense (immune) system reaction, including: ? Itchiness. ? Hives. ? Trouble breathing. ? Anxiety. ? Pain in your chest or lower back. ? Fever, flushing, and chills. ? Fast pulse. ? Rash. ? Watery poop (diarrhea). ? Throwing up (vomiting). ? Dark pee. ? Serious headache. ? Dizziness. ? Stiff neck. ? Yellow color in your face or the white parts of your eyes (jaundice). Summary  After a blood transfusion, return to your normal activities as told by your doctor.  Every day, check for signs of infection where the IV tube was put into your vein.  Some signs of  infection are warm skin, more redness and pain, more fluid or blood, and pus or a bad smell where the needle went in.  Contact your doctor if you feel weak or have any unusual symptoms. This information is not intended to replace advice given to you by your health care provider. Make sure you discuss any questions you have with your health care provider. Document Released: 07/14/2014 Document Revised: 02/15/2016 Document Reviewed: 02/15/2016 Elsevier Interactive Patient Education  2017 [employer]. After the blood transfusion, pt had labs drawn.

## 2017-02-23 NOTE — Telephone Encounter (Signed)
Follow up      Pt is there for blood transfusion and there is no order in system

## 2017-02-23 NOTE — Progress Notes (Signed)
Diagnosis: Symptomatic anemia  Provider: Dr. Sallyanne Kuster  Procedure: Pt had blood drawn for a type and cross match and then had 1 unit of prbcs infused. After transfusion, blood was drawn for a T4, free.  Pt tolerated procedure well.  Post procedure; Pt was alert, oriented and ambulatory to wheelchair. He received d/c instructions with verbal understanding. He was d/c with his wife.

## 2017-02-24 LAB — TYPE AND SCREEN
ABO/RH(D): O POS
Antibody Screen: NEGATIVE
UNIT DIVISION: 0

## 2017-02-24 LAB — BPAM RBC
Blood Product Expiration Date: 201808222359
ISSUE DATE / TIME: 201808201223
Unit Type and Rh: 5100

## 2017-02-25 ENCOUNTER — Telehealth: Payer: Self-pay | Admitting: Cardiovascular Disease

## 2017-02-25 DIAGNOSIS — D500 Iron deficiency anemia secondary to blood loss (chronic): Secondary | ICD-10-CM

## 2017-02-25 DIAGNOSIS — D638 Anemia in other chronic diseases classified elsewhere: Secondary | ICD-10-CM

## 2017-02-25 DIAGNOSIS — R5381 Other malaise: Secondary | ICD-10-CM

## 2017-02-25 DIAGNOSIS — R5383 Other fatigue: Principal | ICD-10-CM

## 2017-02-25 NOTE — Telephone Encounter (Signed)
Spoke with wife and patient has increased shortness of breath and swelling since decreasing Furosemide from 20 mg twice a day to once daily. Patient does weigh himself and weight up about 4 pounds since visit on 02/19/17. Will forward to Dr Sallyanne Kuster for review

## 2017-02-25 NOTE — Telephone Encounter (Signed)
Labs ordered. Spoke with wife, okay per DPR. Wife verbalized understanding and agreed with plan.

## 2017-02-25 NOTE — Telephone Encounter (Signed)
Transfusion was done 02/23/17 Advised wife of recommendations. K+ was decreased at visit from 40 meq daily to 10 meq daily Will forward to Dr Sallyanne Kuster for further review

## 2017-02-25 NOTE — Telephone Encounter (Signed)
Please take furosemide 40 mg twice daily for next 24 hours. After that, go back to the twice daily 20 mg furosemide. When he is going in for his blood transfusion? MCr

## 2017-02-25 NOTE — Telephone Encounter (Signed)
New message    Pt c/o swelling: STAT is pt has developed SOB within 24 hours  1. How long have you been experiencing swelling?  Since he stopped taking taking the lasix  2. Where is the swelling located? Feet  3.  Are you currently taking a "fluid pill"? no  4.  Are you currently SOB? Yes   5.  Have you traveled recently no   Per wife pt sounds like he is smothering when he tries to lay down at night , and wheezes in the day time

## 2017-02-25 NOTE — Telephone Encounter (Signed)
What happened with the planned posttransfusion H/H? Probably best to bring him in tomorrow to get his labs checked (H/H, also get a BMET) and for [place] to take a quick look at him, but have him take the double dose furosemide in the meantime MCr

## 2017-02-26 LAB — BASIC METABOLIC PANEL
BUN / CREAT RATIO: 15 (ref 10–24)
BUN: 15 mg/dL (ref 8–27)
CO2: 26 mmol/L (ref 20–29)
CREATININE: 1.01 mg/dL (ref 0.76–1.27)
Calcium: 8.2 mg/dL — ABNORMAL LOW (ref 8.6–10.2)
Chloride: 98 mmol/L (ref 96–106)
GFR, EST AFRICAN AMERICAN: 77 mL/min/{1.73_m2} (ref >59)
GFR, EST NON AFRICAN AMERICAN: 67 mL/min/{1.73_m2} (ref >59)
Glucose: 131 mg/dL — ABNORMAL HIGH (ref 65–99)
Potassium: 3.2 mmol/L — ABNORMAL LOW (ref 3.5–5.2)
SODIUM: 140 mmol/L (ref 134–144)

## 2017-02-26 NOTE — Telephone Encounter (Addendum)
Patient came in for labs today.  Dr C recommended patient continue taking Furosemide 40 mg twice daily until he ways equal to or less than 170 pounds then resume previous dose. Patient admitted to not weighing daily d/t being unsteady. Patient weighed today, 175.2lbs. Advised patient to start weighing daily, if at all possible. Patient verbalized understanding.  Follow-up with MCr in November, NP/PA in 3-4 weeks. Appointment scheduled for 03/20/17 at 10:40a with Chanetta Marshall, NP at our [facility] location.

## 2017-03-02 ENCOUNTER — Other Ambulatory Visit: Payer: Self-pay | Admitting: Cardiovascular Disease

## 2017-03-02 NOTE — Telephone Encounter (Signed)
Rx request sent to pharmacy.  

## 2017-03-04 ENCOUNTER — Other Ambulatory Visit: Payer: Self-pay

## 2017-03-04 ENCOUNTER — Telehealth: Payer: Self-pay | Admitting: Cardiovascular Disease

## 2017-03-04 DIAGNOSIS — Z79899 Other long term (current) drug therapy: Secondary | ICD-10-CM

## 2017-03-04 NOTE — Telephone Encounter (Signed)
New Message    Pt wife returning [place] call for Bmet

## 2017-03-04 NOTE — Telephone Encounter (Signed)
See result note.  

## 2017-03-11 ENCOUNTER — Other Ambulatory Visit: Payer: Self-pay | Admitting: *Deleted

## 2017-03-11 DIAGNOSIS — Z79899 Other long term (current) drug therapy: Secondary | ICD-10-CM

## 2017-03-11 DIAGNOSIS — R5381 Other malaise: Secondary | ICD-10-CM

## 2017-03-11 DIAGNOSIS — R5383 Other fatigue: Secondary | ICD-10-CM

## 2017-03-11 DIAGNOSIS — D638 Anemia in other chronic diseases classified elsewhere: Secondary | ICD-10-CM

## 2017-03-11 DIAGNOSIS — D500 Iron deficiency anemia secondary to blood loss (chronic): Secondary | ICD-10-CM

## 2017-03-11 LAB — T4, FREE: Free T4: 1.47 ng/dL (ref 0.82–1.77)

## 2017-03-11 LAB — BASIC METABOLIC PANEL
BUN / CREAT RATIO: 14 (ref 10–24)
BUN: 16 mg/dL (ref 8–27)
CHLORIDE: 97 mmol/L (ref 96–106)
CO2: 28 mmol/L (ref 20–29)
Calcium: 9.5 mg/dL (ref 8.6–10.2)
Creatinine, Ser: 1.12 mg/dL (ref 0.76–1.27)
GFR, EST AFRICAN AMERICAN: 68 mL/min/{1.73_m2} (ref >59)
GFR, EST NON AFRICAN AMERICAN: 59 mL/min/{1.73_m2} — AB (ref >59)
Glucose: 119 mg/dL — ABNORMAL HIGH (ref 65–99)
Potassium: 4 mmol/L (ref 3.5–5.2)
SODIUM: 142 mmol/L (ref 134–144)

## 2017-03-11 LAB — TSH: TSH: 6.6 u[IU]/mL — ABNORMAL HIGH (ref 0.450–4.500)

## 2017-03-12 ENCOUNTER — Ambulatory Visit (INDEPENDENT_AMBULATORY_CARE_PROVIDER_SITE_OTHER): Payer: Medicare Other | Admitting: Physician Assistant

## 2017-03-12 VITALS — BP 114/60 | HR 65 | Ht 74.0 in | Wt 174.0 lb

## 2017-03-12 DIAGNOSIS — I10 Essential (primary) hypertension: Secondary | ICD-10-CM

## 2017-03-12 DIAGNOSIS — I2510 Atherosclerotic heart disease of native coronary artery without angina pectoris: Secondary | ICD-10-CM

## 2017-03-12 DIAGNOSIS — D649 Anemia, unspecified: Secondary | ICD-10-CM | POA: Diagnosis not present

## 2017-03-12 DIAGNOSIS — I5022 Chronic systolic (congestive) heart failure: Secondary | ICD-10-CM

## 2017-03-12 DIAGNOSIS — Z950 Presence of cardiac pacemaker: Secondary | ICD-10-CM

## 2017-03-12 DIAGNOSIS — I4819 Other persistent atrial fibrillation: Secondary | ICD-10-CM

## 2017-03-12 DIAGNOSIS — I481 Persistent atrial fibrillation: Secondary | ICD-10-CM

## 2017-03-12 LAB — SPECIMEN STATUS REPORT

## 2017-03-12 MED ORDER — PRAVASTATIN SODIUM 40 MG PO TABS: 40.0000 mg | ORAL_TABLET | Freq: Every day | ORAL | 0 refills | Status: DC

## 2017-03-12 NOTE — Progress Notes (Signed)
Cardiology Office Note Date:  03/12/2017  Patient ID:  Dakota Liu, Dakota Liu 1928-02-24, MRN [id number] PCP:  Christain Sacramento, MD  Cardiologist:  Dr. Sallyanne Kuster   Chief Complaint: SOB  History of Present Illness: Dakota Liu is a 83 y.o. male with history of IC<. Chronic CHF (mixed), CAD/(CABG in 1988, stent 2011, cutting balloon angioplasty 2012), PAD, AFib, , VT, CRT-P, hx of recurrent anemia on a/c, HTN, HLD,   Last cardiac cath in October 2014 showed severe native vessel disease (70% left main, 95% ostial LAD occluded proximally, all OM branch is occluded, right coronary artery occluded proximally) but with patent grafts (LIMA to the LAD, SVG to diagonal, sequential SVG to OM1 and-1 to-13, SVG to RCA) and patent site of previous stent in the distal RCA.  He had moderate to severe ischemic cardiopathy with estimated LVEF of 30% and akinesis of the mid to distal anterior wall and mid to distal inferior wall and apical dyskinesis  He has had trouble with symptomatic AFiband recurrent CHF with the AF, though unable to maintain on a/c with recurrent iron def. Anemia.  His last DCCV was, 01/12/17 maintained on amiodarone and his a/c stopped 5 weeks afterwards, his lasix/K+ was decreased at his last visit with Dr. Loletha Grayer only a couple weeks ago.  He comes in today accompanied by his wife.  He says he doesn't know why, that someone told him to.  There are phone notes 8/22 that the wife/patient called with increased SOB/4lb weight gain.  He was s/p PRBC transfusion/lasix 02/23/17.  They were advised to double his lasix until his weight came back to 170lbs.  He remains OFF his a/c, denies any changes in stool from his baseline, no overt bleeding or signs of bleeding observed.  He had a BMET yesterday that looked OK, Creat was at 1.12 (up slightly), his TSH looked OK, unfortunately I don't see an H/H  The patient is very sedentary.  He feels like he can do what he feels like doing.  He does get winded when  ambulating but rarely ambulates much given severe peripheral neuropathy.  He wears LE braces but his legs will still give out on him occasionally, had a fall 2 weeks ago when they "gave out", he is certain was not syncope.  He prefers not to do much then risk falling.  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  He denies any CP, is unaware of any palpitations, he denies near syncope or syncope.  Device information: MDT CRT-P, implanted 06/25/10, gen change 10/09/15, Dr. Sallyanne Kuster LV lead thresholds are known to be high  Past Medical History:  Diagnosis Date  . Anemia, iron deficiency 11/21/2011  . CAD (coronary artery disease) '88, 12/11, 3/12   CABG'88, PCI 12/1, 3/12  . CHF (congestive heart failure) ([place])   . Dyslipidemia   . Epididymitis   . HTN (hypertension)   . Ischemic cardiomyopathy 10/14   EF improved to 40-45% after BiV ICD  . LBBB (left bundle branch block)   . Lymphoma ([place])    with metal radiation about 2006  . PAF (paroxysmal atrial fibrillation) ([place]) 12/11  . PVD (peripheral vascular disease) ([place]) 11/10   AAA R&G  . VT (ventricular tachycardia) ([place]) 12/11    Past Surgical History:  Procedure Laterality Date  . ABDOMINAL AORTIC ANEURYSM REPAIR  11/10  .  CARDIAC DEFIBRILLATOR PLACEMENT  12/11  . CARDIOVERSION N/A 05/20/2016   Procedure: CARDIOVERSION;  Surgeon: Sanda Klein, MD;  Location: [facility];  Service: Cardiovascular;  Laterality: N/A;  . CARDIOVERSION N/A 01/12/2017   Procedure: CARDIOVERSION;  Surgeon: Sanda Klein, MD;  Location: [facility];  Service: Cardiovascular;  Laterality: N/A;  . CORONARY ANGIOPLASTY WITH STENT PLACEMENT  07/05/10, 3/12   RCA with ISR 3/12  . CORONARY ARTERY BYPASS GRAFT  1988  . EP IMPLANTABLE DEVICE N/A 10/09/2015   Procedure: ICD to Central;  Surgeon: Sanda Klein, MD;  Location: [facility] CV LAB;   Service: Cardiovascular;  Laterality: N/A;  . LEFT HEART CATHETERIZATION WITH CORONARY/GRAFT ANGIOGRAM N/A 05/03/2013   Procedure: LEFT HEART CATHETERIZATION WITH Beatrix Fetters;  Surgeon: Peter M Martinique, MD;  Location[facility] CATH LAB;  Service: Cardiovascular;  Laterality: N/A;  . ORCHIECTOMY    . TEE WITHOUT CARDIOVERSION N/A 05/20/2016   Procedure: TRANSESOPHAGEAL ECHOCARDIOGRAM (TEE);  Surgeon: Sanda Klein, MD;  Location: [facility] ENDOSCOPY;  Service: Cardiovascular;  Laterality: N/A;  . TEE WITHOUT CARDIOVERSION N/A 01/12/2017   Procedure: TRANSESOPHAGEAL ECHOCARDIOGRAM (TEE);  Surgeon: Sanda Klein, MD;  Location: [facility] ENDOSCOPY;  Service: Cardiovascular;  Laterality: N/A;  . TONSILLECTOMY      Current Outpatient Prescriptions  Medication Sig Dispense Refill  . amiodarone (PACERONE) 200 MG tablet Take 1 tablet (200 mg total) by mouth 2 (two) times daily. 180 tablet 3  . furosemide (LASIX) 20 MG tablet Take 1 tablet (20 mg total) by mouth daily. 90 tablet 3  . iron polysaccharides (NU-IRON) 150 MG capsule Take 1 capsule (150 mg total) by mouth 2 (two) times daily. 60 capsule 0  . Menthol, Topical Analgesic, (BENGAY EX) Apply 1 application topically as needed (for pain).    . metoprolol succinate (TOPROL-XL) 50 MG 24 hr tablet TAKE 2 TABLETS IN THE MORNING AND 1 TABLET IN THE EVENING 90 tablet 1  . nitroGLYCERIN (NITROSTAT) 0.4 MG SL tablet Place 0.4 mg under the tongue every 5 (five) minutes as needed.      . pantoprazole (PROTONIX) 40 MG tablet Take 1 tablet (40 mg total) by mouth 2 (two) times daily. 60 tablet 0  . potassium chloride (K-DUR) 10 MEQ tablet Take 1 tablet (10 mEq total) by mouth daily. 90 tablet 3  . ranolazine (RANEXA) 500 MG 12 hr tablet Take 1 tablet (500 mg total) by mouth 2 (two) times daily. 60 tablet 0  . simvastatin (ZOCOR) 20 MG tablet TAKE 1 TABLET BY MOUTH EVERY DAY 30 tablet 11   Current Facility-Administered Medications  Medication Dose Route Frequency  Provider Last Rate Last Dose  . 0.9 %  sodium chloride infusion   Intravenous Once Croitoru, Mihai, MD      . 0.9 %  sodium chloride infusion   Intravenous Once Croitoru, Mihai, MD      . 0.9 %  sodium chloride infusion   Intravenous Once Croitoru, Mihai, MD      . 0.9 %  sodium chloride infusion   Intravenous Once Croitoru, Mihai, MD      . acetaminophen (TYLENOL) tablet 650 mg  650 mg Oral Once Croitoru, Mihai, MD      . furosemide (LASIX) injection 40 mg  40 mg Intravenous Once Croitoru, Mihai, MD      . furosemide (LASIX) injection 40 mg  40 mg Intravenous Once Croitoru, Mihai, MD      . furosemide (LASIX) injection 40 mg  40 mg Intravenous Once Croitoru, Dani Gobble, MD  Allergies:   Tramadol; Ace inhibitors; Codeine; and Simvastatin   Social History:  The patient  reports that he quit smoking about 32 years ago. His smoking use included Cigarettes. He has a 44.00 pack-year smoking history. He has never used smokeless tobacco. He reports that he does not drink alcohol or use drugs.   Family History:  The patient's family history includes Cancer in his sister; Diabetes in his mother; Heart Problems in his mother; Stroke in his father.  ROS:  Please see the history of present illness.  All other systems are reviewed and otherwise negative.   PHYSICAL EXAM:  VS:  BP 114/60   Pulse 65   Ht 6\' 2"  (1.88 m)   Wt 174 lb (78.9 kg)   BMI 22.34 kg/m  BMI: Body mass index is 22.34 kg/m. Well nourished, well developed, in no acute distress  HEENT: normocephalic, atraumatic  Neck: no JVD, carotid bruits or masses Cardiac:  IRRR; no significant murmurs, no rubs, or gallops Lungs:  CTA b/l, no wheezing, rhonchi or rales  Abd: soft, nontender MS: no deformity, age appropriate atrophy Ext: 1-2+ RLE, 1+ LLE edema  Skin: warm and dry, no rash Neuro:  No gross deficits appreciated Psych: euthymic mood, full affect  PPM site is stable, no tethering or discomfort   EKG:  Done 02/19/17 paced,  R wave in V1, QS I suggests LV pacing PPM interrogation done today and reviewed by myself: battery and lead measurements appear stable from previous.  LV lead thresholds appear known to be high, his last EKG looks like BiVe pacing.  He is back in AF with reduced BIVE pacing % <90 Appears he had AF 03/04/17 for some days, then again 03/09/17 to now No VT Optivol is c/w fluid OL   01/12/17: TEE/DCCV Study Conclusions - Left ventricle: The cavity size was normal. Wall thickness was   normal. Systolic function was moderately to severely reduced. The   estimated ejection fraction was in the range of 30% to 35%.   Akinesis and scarring of the mid-apicalanteroseptal, anterior,   and apical myocardium; consistent with infarction in the   distribution of the left anterior descending coronary artery. No   evidence of thrombus. - Aortic valve: No evidence of vegetation. - Mitral valve: No evidence of vegetation. There was mild   regurgitation. - Left atrium: The atrium was moderately dilated. No evidence of   thrombus in the atrial cavity or appendage. There was   mildcontinuous spontaneous echo contrast (&quot;smoke&quot;) in the cavity. - Right atrium: No evidence of thrombus in the atrial cavity or   appendage. - Atrial septum: No defect or patent foramen ovale was identified. - Tricuspid valve: No evidence of vegetation. - Pulmonic valve: No evidence of vegetation. Impressions: - Successful cardioversion. No cardiac source of emboli was   indentified.  Recent Labs: 01/01/2017: NT-Pro BNP 3,145 02/19/2017: ALT 11; BNP 352.3; Hemoglobin 7.1; Platelets 202 03/11/2017: BUN 16; Creatinine, Ser 1.12; Potassium 4.0; Sodium 142; TSH 6.600  No results found for requested labs within last 8760 hours.   Estimated Creatinine Clearance: 51.9 mL/min (by C-G formula based on SCr of 1.12 mg/dL).   Wt Readings from Last 3 Encounters:  03/12/17 174 lb (78.9 kg)  02/19/17 172 lb (78 kg)  01/15/17 170 lb 2 oz  (77.2 kg)     Other studies reviewed: Additional studies/records reviewed today include: summarized above  ASSESSMENT AND PLAN:  1. CRT-P     Functioning as programmed, LV threshold today  4.5, output increased to 5.5V to try and ensure LV capture  2. Chronic CHF, ICM     Goal weight is 170, down 1lb from yesterday, will have him continue the 40mg  BID lasix for total of 5 days     Discussed daily weights  3. CAD     No anginal symptoms     On BB, ranexa, statin  Dr. Neila Gear mentioned changing to pravachol, will do this  4. HTN     Looks OK  5. Persistent AFib     CHA2DS2Vasc is at least 5, unablet maintain on a/c 2/2 recurrent anemia     02/25/17 repeat transfusion for H/H 7.1/23.5     Amiodarone     LFTs/TSH look OK      The patient is off his a/c and cannot offer now DCCV without TEE and would need to resume his a/c at least a few weeks again.  Discussed getting H/h today and discussing TEE/DCCV with concerns of AF contributing to his fluid OL and noted decrease in his BiVe pacing %   The patient is not necessarily on board with this plan with his recurrent anemia, need for transfusions, on/off blood thinner.  He is not unhappy with how he feels currently, and would like to try and just manage with medicines.  Can increase his BB some to try and pace more, though looks like his BP may be limiting.  Will address fluid OL 1st, he will continue 40mg  lasix daily for total of 5 days, then resume 20mg  daily, he is unable to stand on the scale though at home, he feels like since yesterday already is less swollen, weight is down 1lb from yesterday.   Disposition: F/u with me in 2 weeks, sooner if needed.  He would be willing to revisit TEE/DCCV if medicine management fails, may need to accept some degree of creat bump.  Current medicines are reviewed at length with the patient today.  The patient did not have any concerns regarding medicines.  Venetia Night, PA-C 03/12/2017 9:47 AM      [facility] [place] [address] [phone] (office)  [phone] (fax)

## 2017-03-12 NOTE — Patient Instructions (Addendum)
Medication Instructions:   STOP TAKING SIMVASTATIN 20 MG   START TAKING PRAVACHOL 40 MG ONCE A DAY   TAKE LASIX 20 TWICE A DAY FOR 4 MORE DAYS THEN START TAKING ONCE A DAY   If you need a refill on your cardiac medications before your next appointment, please call your pharmacy.  Labwork:  H/H TODAY    Testing/Procedures: NONE ORDERED  TODAY    Follow-Up: IN 2 TO 3 WEEKS WITH URSUY    Any Other Special Instructions Will Be Listed Below (If Applicable).

## 2017-03-13 ENCOUNTER — Telehealth: Payer: Self-pay | Admitting: *Deleted

## 2017-03-13 NOTE — Telephone Encounter (Signed)
WIFE MADE AWARE OF RESULTS AND TO KEEP FOLLOW UP AS SCHEDULED

## 2017-03-17 ENCOUNTER — Encounter (HOSPITAL_COMMUNITY): Payer: Self-pay

## 2017-03-17 ENCOUNTER — Emergency Department (HOSPITAL_COMMUNITY): Payer: Medicare Other

## 2017-03-17 ENCOUNTER — Inpatient Hospital Stay (HOSPITAL_COMMUNITY)
Admission: EM | Admit: 2017-03-17 | Discharge: 2017-03-21 | DRG: 292 | Disposition: A | Discharge status: Hospice | Payer: Medicare Other | Attending: Internal Medicine | Admitting: Internal Medicine

## 2017-03-17 DIAGNOSIS — I2510 Atherosclerotic heart disease of native coronary artery without angina pectoris: Secondary | ICD-10-CM | POA: Diagnosis present

## 2017-03-17 DIAGNOSIS — Z66 Do not resuscitate: Secondary | ICD-10-CM | POA: Diagnosis present

## 2017-03-17 DIAGNOSIS — G8220 Paraplegia, unspecified: Secondary | ICD-10-CM | POA: Diagnosis present

## 2017-03-17 DIAGNOSIS — I255 Ischemic cardiomyopathy: Secondary | ICD-10-CM | POA: Diagnosis present

## 2017-03-17 DIAGNOSIS — F05 Delirium due to known physiological condition: Secondary | ICD-10-CM | POA: Diagnosis present

## 2017-03-17 DIAGNOSIS — Z993 Dependence on wheelchair: Secondary | ICD-10-CM

## 2017-03-17 DIAGNOSIS — Z515 Encounter for palliative care: Secondary | ICD-10-CM

## 2017-03-17 DIAGNOSIS — I10 Essential (primary) hypertension: Secondary | ICD-10-CM | POA: Diagnosis not present

## 2017-03-17 DIAGNOSIS — Z888 Allergy status to other drugs, medicaments and biological substances status: Secondary | ICD-10-CM

## 2017-03-17 DIAGNOSIS — K219 Gastro-esophageal reflux disease without esophagitis: Secondary | ICD-10-CM | POA: Diagnosis present

## 2017-03-17 DIAGNOSIS — I5023 Acute on chronic systolic (congestive) heart failure: Secondary | ICD-10-CM | POA: Diagnosis present

## 2017-03-17 DIAGNOSIS — R0602 Shortness of breath: Secondary | ICD-10-CM | POA: Diagnosis not present

## 2017-03-17 DIAGNOSIS — Z8572 Personal history of non-Hodgkin lymphomas: Secondary | ICD-10-CM

## 2017-03-17 DIAGNOSIS — I509 Heart failure, unspecified: Secondary | ICD-10-CM | POA: Diagnosis present

## 2017-03-17 DIAGNOSIS — I4819 Other persistent atrial fibrillation: Secondary | ICD-10-CM | POA: Diagnosis present

## 2017-03-17 DIAGNOSIS — E876 Hypokalemia: Secondary | ICD-10-CM | POA: Diagnosis present

## 2017-03-17 DIAGNOSIS — E785 Hyperlipidemia, unspecified: Secondary | ICD-10-CM | POA: Diagnosis present

## 2017-03-17 DIAGNOSIS — D509 Iron deficiency anemia, unspecified: Secondary | ICD-10-CM | POA: Diagnosis present

## 2017-03-17 DIAGNOSIS — Z950 Presence of cardiac pacemaker: Secondary | ICD-10-CM | POA: Diagnosis not present

## 2017-03-17 DIAGNOSIS — F0390 Unspecified dementia without behavioral disturbance: Secondary | ICD-10-CM | POA: Diagnosis present

## 2017-03-17 DIAGNOSIS — K5900 Constipation, unspecified: Secondary | ICD-10-CM | POA: Diagnosis present

## 2017-03-17 DIAGNOSIS — J9811 Atelectasis: Secondary | ICD-10-CM | POA: Diagnosis present

## 2017-03-17 DIAGNOSIS — E7800 Pure hypercholesterolemia, unspecified: Secondary | ICD-10-CM | POA: Diagnosis not present

## 2017-03-17 DIAGNOSIS — Z9221 Personal history of antineoplastic chemotherapy: Secondary | ICD-10-CM

## 2017-03-17 DIAGNOSIS — Z95810 Presence of automatic (implantable) cardiac defibrillator: Secondary | ICD-10-CM

## 2017-03-17 DIAGNOSIS — I481 Persistent atrial fibrillation: Secondary | ICD-10-CM | POA: Diagnosis present

## 2017-03-17 DIAGNOSIS — Z951 Presence of aortocoronary bypass graft: Secondary | ICD-10-CM | POA: Diagnosis not present

## 2017-03-17 DIAGNOSIS — I739 Peripheral vascular disease, unspecified: Secondary | ICD-10-CM | POA: Diagnosis present

## 2017-03-17 DIAGNOSIS — Z955 Presence of coronary angioplasty implant and graft: Secondary | ICD-10-CM

## 2017-03-17 DIAGNOSIS — Z79899 Other long term (current) drug therapy: Secondary | ICD-10-CM | POA: Diagnosis not present

## 2017-03-17 DIAGNOSIS — Z87891 Personal history of nicotine dependence: Secondary | ICD-10-CM

## 2017-03-17 DIAGNOSIS — R531 Weakness: Secondary | ICD-10-CM | POA: Diagnosis not present

## 2017-03-17 DIAGNOSIS — R0603 Acute respiratory distress: Secondary | ICD-10-CM | POA: Diagnosis not present

## 2017-03-17 DIAGNOSIS — D638 Anemia in other chronic diseases classified elsewhere: Secondary | ICD-10-CM | POA: Diagnosis present

## 2017-03-17 DIAGNOSIS — I110 Hypertensive heart disease with heart failure: Principal | ICD-10-CM | POA: Diagnosis present

## 2017-03-17 DIAGNOSIS — G4700 Insomnia, unspecified: Secondary | ICD-10-CM | POA: Diagnosis present

## 2017-03-17 DIAGNOSIS — Z8673 Personal history of transient ischemic attack (TIA), and cerebral infarction without residual deficits: Secondary | ICD-10-CM

## 2017-03-17 DIAGNOSIS — R410 Disorientation, unspecified: Secondary | ICD-10-CM | POA: Diagnosis not present

## 2017-03-17 DIAGNOSIS — D500 Iron deficiency anemia secondary to blood loss (chronic): Secondary | ICD-10-CM | POA: Diagnosis not present

## 2017-03-17 DIAGNOSIS — Z885 Allergy status to narcotic agent status: Secondary | ICD-10-CM

## 2017-03-17 DIAGNOSIS — F419 Anxiety disorder, unspecified: Secondary | ICD-10-CM | POA: Diagnosis present

## 2017-03-17 DIAGNOSIS — Z823 Family history of stroke: Secondary | ICD-10-CM

## 2017-03-17 HISTORY — DX: Anemia, unspecified: D64.9

## 2017-03-17 LAB — CBC WITH DIFFERENTIAL/PLATELET
Basophils Absolute: 0 10*3/uL (ref 0.0–0.1)
Basophils Relative: 0 %
EOS PCT: 0 %
Eosinophils Absolute: 0 10*3/uL (ref 0.0–0.7)
HCT: 26.9 % — ABNORMAL LOW (ref 39.0–52.0)
Hemoglobin: 7.7 g/dL — ABNORMAL LOW (ref 13.0–17.0)
LYMPHS ABS: 0.4 10*3/uL — AB (ref 0.7–4.0)
LYMPHS PCT: 6 %
MCH: 23.5 pg — ABNORMAL LOW (ref 26.0–34.0)
MCHC: 28.6 g/dL — ABNORMAL LOW (ref 30.0–36.0)
MCV: 82.3 fL (ref 78.0–100.0)
MONO ABS: 0.9 10*3/uL (ref 0.1–1.0)
MONOS PCT: 12 %
Neutro Abs: 6.2 10*3/uL (ref 1.7–7.7)
Neutrophils Relative %: 82 %
Platelets: 211 10*3/uL (ref 150–400)
RBC: 3.27 MIL/uL — AB (ref 4.22–5.81)
RDW: 19.5 % — AB (ref 11.5–15.5)
WBC: 7.6 10*3/uL (ref 4.0–10.5)

## 2017-03-17 LAB — CBC
HEMATOCRIT: 30.7 % — AB (ref 39.0–52.0)
HEMOGLOBIN: 8.9 g/dL — AB (ref 13.0–17.0)
MCH: 23.6 pg — AB (ref 26.0–34.0)
MCHC: 29 g/dL — AB (ref 30.0–36.0)
MCV: 81.4 fL (ref 78.0–100.0)
Platelets: 213 10*3/uL (ref 150–400)
RBC: 3.77 MIL/uL — ABNORMAL LOW (ref 4.22–5.81)
RDW: 18.9 % — ABNORMAL HIGH (ref 11.5–15.5)
WBC: 8.6 10*3/uL (ref 4.0–10.5)

## 2017-03-17 LAB — BASIC METABOLIC PANEL
Anion gap: 8 (ref 5–15)
BUN: 18 mg/dL (ref 6–20)
CHLORIDE: 99 mmol/L — AB (ref 101–111)
CO2: 29 mmol/L (ref 22–32)
CREATININE: 1.13 mg/dL (ref 0.61–1.24)
Calcium: 8.6 mg/dL — ABNORMAL LOW (ref 8.9–10.3)
GFR calc Af Amer: >60 mL/min (ref >60)
GFR calc non Af Amer: 56 mL/min — ABNORMAL LOW (ref >60)
GLUCOSE: 131 mg/dL — AB (ref 65–99)
POTASSIUM: 3.4 mmol/L — AB (ref 3.5–5.1)
Sodium: 136 mmol/L (ref 135–145)

## 2017-03-17 LAB — BRAIN NATRIURETIC PEPTIDE: B NATRIURETIC PEPTIDE 5: 682.5 pg/mL — AB (ref 0.0–100.0)

## 2017-03-17 LAB — PREPARE RBC (CROSSMATCH)

## 2017-03-17 LAB — I-STAT TROPONIN, ED: TROPONIN I, POC: 0 ng/mL (ref 0.00–0.08)

## 2017-03-17 MED ORDER — ALBUTEROL SULFATE (2.5 MG/3ML) 0.083% IN NEBU
2.5000 mg | INHALATION_SOLUTION | Freq: Four times a day (QID) | RESPIRATORY_TRACT | Status: AC
  Administered 2017-03-17 (×2): 2.5 mg via RESPIRATORY_TRACT
  Filled 2017-03-17 (×2): qty 3

## 2017-03-17 MED ORDER — SENNOSIDES-DOCUSATE SODIUM 8.6-50 MG PO TABS
1.0000 | ORAL_TABLET | Freq: Two times a day (BID) | ORAL | Status: AC
  Administered 2017-03-17 – 2017-03-18 (×3): 1 via ORAL
  Filled 2017-03-17 (×3): qty 1

## 2017-03-17 MED ORDER — SODIUM CHLORIDE 0.9 % IV SOLN: Freq: Once | INTRAVENOUS | Status: DC

## 2017-03-17 MED ORDER — SIMVASTATIN 20 MG PO TABS
20.0000 mg | ORAL_TABLET | Freq: Every day | ORAL | Status: DC
  Filled 2017-03-17: qty 1

## 2017-03-17 MED ORDER — RANOLAZINE ER 500 MG PO TB12
500.0000 mg | ORAL_TABLET | Freq: Two times a day (BID) | ORAL | Status: DC
  Administered 2017-03-17 – 2017-03-21 (×9): 500 mg via ORAL
  Filled 2017-03-17 (×9): qty 1

## 2017-03-17 MED ORDER — POTASSIUM CHLORIDE CRYS ER 10 MEQ PO TBCR
10.0000 meq | EXTENDED_RELEASE_TABLET | Freq: Two times a day (BID) | ORAL | Status: DC
  Administered 2017-03-17 (×2): 10 meq via ORAL
  Filled 2017-03-17 (×3): qty 1

## 2017-03-17 MED ORDER — SODIUM CHLORIDE 0.9% FLUSH
3.0000 mL | Freq: Two times a day (BID) | INTRAVENOUS | Status: DC
  Administered 2017-03-17 – 2017-03-21 (×8): 3 mL via INTRAVENOUS

## 2017-03-17 MED ORDER — PRAVASTATIN SODIUM 40 MG PO TABS
40.0000 mg | ORAL_TABLET | Freq: Every day | ORAL | Status: DC
  Administered 2017-03-18 – 2017-03-20 (×3): 40 mg via ORAL
  Filled 2017-03-17 (×3): qty 1

## 2017-03-17 MED ORDER — MAGNESIUM HYDROXIDE 400 MG/5ML PO SUSP
30.0000 mL | Freq: Two times a day (BID) | ORAL | Status: AC
  Filled 2017-03-17: qty 30

## 2017-03-17 MED ORDER — METOPROLOL SUCCINATE ER 50 MG PO TB24
50.0000 mg | ORAL_TABLET | Freq: Every day | ORAL | Status: DC
  Administered 2017-03-17 – 2017-03-18 (×2): 50 mg via ORAL
  Filled 2017-03-17 (×2): qty 1

## 2017-03-17 MED ORDER — FUROSEMIDE 10 MG/ML IJ SOLN
40.0000 mg | Freq: Two times a day (BID) | INTRAMUSCULAR | Status: DC
  Administered 2017-03-17 – 2017-03-18 (×2): 40 mg via INTRAVENOUS
  Filled 2017-03-17: qty 4

## 2017-03-17 MED ORDER — POLYSACCHARIDE IRON COMPLEX 150 MG PO CAPS
150.0000 mg | ORAL_CAPSULE | Freq: Two times a day (BID) | ORAL | Status: DC
  Administered 2017-03-17 – 2017-03-20 (×6): 150 mg via ORAL
  Filled 2017-03-17 (×7): qty 1

## 2017-03-17 MED ORDER — PANTOPRAZOLE SODIUM 40 MG PO TBEC
40.0000 mg | DELAYED_RELEASE_TABLET | Freq: Two times a day (BID) | ORAL | Status: DC
  Administered 2017-03-17 – 2017-03-20 (×6): 40 mg via ORAL
  Filled 2017-03-17 (×6): qty 1

## 2017-03-17 MED ORDER — FUROSEMIDE 10 MG/ML IJ SOLN
40.0000 mg | Freq: Once | INTRAMUSCULAR | Status: AC
  Administered 2017-03-17: 40 mg via INTRAVENOUS
  Filled 2017-03-17: qty 4

## 2017-03-17 MED ORDER — ENOXAPARIN SODIUM 40 MG/0.4ML ~~LOC~~ SOLN
40.0000 mg | Freq: Every day | SUBCUTANEOUS | Status: DC
  Administered 2017-03-17 – 2017-03-20 (×4): 40 mg via SUBCUTANEOUS
  Filled 2017-03-17 (×4): qty 0.4

## 2017-03-17 MED ORDER — SODIUM CHLORIDE 0.9% FLUSH: 3.0000 mL | INTRAVENOUS | Status: DC | PRN

## 2017-03-17 MED ORDER — SODIUM CHLORIDE 0.9 % IV SOLN: 250.0000 mL | INTRAVENOUS | Status: DC | PRN

## 2017-03-17 MED ORDER — AMIODARONE HCL 200 MG PO TABS
200.0000 mg | ORAL_TABLET | Freq: Two times a day (BID) | ORAL | Status: DC
  Administered 2017-03-17 – 2017-03-21 (×9): 200 mg via ORAL
  Filled 2017-03-17 (×9): qty 1

## 2017-03-17 MED ORDER — ONDANSETRON HCL 4 MG/2ML IJ SOLN: 4.0000 mg | Freq: Four times a day (QID) | INTRAMUSCULAR | Status: DC | PRN

## 2017-03-17 MED ORDER — ACETAMINOPHEN 325 MG PO TABS
650.0000 mg | ORAL_TABLET | ORAL | Status: DC | PRN
  Administered 2017-03-20: 650 mg via ORAL

## 2017-03-17 NOTE — H&P (Signed)
History and Physical    Dakota Liu DOB: December 09, 1927 DOA: 03/17/2017  PCP: Christain Sacramento, MD Patient coming from: home Chief Complaint: sob  HPI: Dakota Liu is a very pleasant slightly HOH 83 y.o. male with medical history significant for chronic systolic heart failure, CAD status post CABG 1988, PCI 2012, s/p pacer A. fib, hypertension, dyslipidemia, since emergency department with persistent worsening shortness of breath. Initial evaluation reveals acute respiratory distress likely related to acute on chronic systolic heart failure in the setting of worsening anemia  Information is obtained from the patient and his chart. He reports gradual worsening shortness of breath over the last several days. Associated symptoms include gradual weight gain and increased lower extremity edema. He reports he received blood transfusion last month. He reports he was evaluated for GI bleed due to low hemoglobin but states "they can't find where the bleeding scum in from". He also reports he saw his cardiologist 5 days ago they recommended Lasix 40 mg twice a day for 5 days. He denies chest pain palpitations headache dizziness syncope or near-syncope. He denies abdominal pain nausea vomiting but does endorse a decreased appetite. He denies dysuria hematuria frequency or urgency. He does report constipation. He attributes as side effects for his medications. Last bowel movement 2 days ago    ED Course: In the emergency department is afebrile hemodynamically stable slightly tachypnic oxygen saturation level 94% on room air. He is provided with Lasix 40 mg IV in the emergency department.  Review of Systems: As per HPI otherwise all other systems reviewed and are negative.   Ambulatory Status: Patient is wheelchair-bound  Past Medical History:  Diagnosis Date  . Anemia, iron deficiency 11/21/2011  . CAD (coronary artery disease) '88, 12/11, 3/12   CABG'88, PCI 12/1, 3/12  . CHF  (congestive heart failure) ([place])   . Dyslipidemia   . Epididymitis   . HTN (hypertension)   . Ischemic cardiomyopathy 10/14   EF improved to 40-45% after BiV ICD  . LBBB (left bundle branch block)   . Lymphoma ([place])    with metal radiation about 2006  . PAF (paroxysmal atrial fibrillation) ([place]) 12/11  . PVD (peripheral vascular disease) ([place]) 11/10   AAA R&G  . VT (ventricular tachycardia) ([place]) 12/11    Past Surgical History:  Procedure Laterality Date  . ABDOMINAL AORTIC ANEURYSM REPAIR  11/10  . CARDIAC DEFIBRILLATOR PLACEMENT  12/11  . CARDIOVERSION N/A 05/20/2016   Procedure: CARDIOVERSION;  Surgeon: Sanda Klein, MD;  Location: [facility];  Service: Cardiovascular;  Laterality: N/A;  . CARDIOVERSION N/A 01/12/2017   Procedure: CARDIOVERSION;  Surgeon: Sanda Klein, MD;  Location: [facility];  Service: Cardiovascular;  Laterality: N/A;  . CORONARY ANGIOPLASTY WITH STENT PLACEMENT  07/05/10, 3/12   RCA with ISR 3/12  . CORONARY ARTERY BYPASS GRAFT  1988  . EP IMPLANTABLE DEVICE N/A 10/09/2015   Procedure: ICD to [place];  Surgeon: Sanda Klein, MD;  Location: [facility];  Service: Cardiovascular;  Laterality: N/A;  . LEFT HEART CATHETERIZATION WITH CORONARY/GRAFT ANGIOGRAM N/A 05/03/2013   Procedure: LEFT HEART CATHETERIZATION WITH Beatrix Fetters;  Surgeon: Peter M Martinique, MD;  Location[facility];  Service: Cardiovascular;  Laterality: N/A;  . ORCHIECTOMY    . TEE WITHOUT CARDIOVERSION N/A 05/20/2016   Procedure: TRANSESOPHAGEAL ECHOCARDIOGRAM (TEE);  Surgeon: Sanda Klein, MD;  Location: [facility];  Service: Cardiovascular;  Laterality: N/A;  . TEE WITHOUT CARDIOVERSION N/A 01/12/2017   Procedure:  TRANSESOPHAGEAL ECHOCARDIOGRAM (TEE);  Surgeon: Sanda Klein, MD;  Location: [facility];  Service: Cardiovascular;  Laterality: N/A;  . TONSILLECTOMY      Social History   Social History  . Marital status: Married    Spouse  name: N/A  . Number of children: N/A  . Years of education: N/A   Occupational History  . Not on file.   Social History Main Topics  . Smoking status: Former Smoker    Packs/day: 1.00    Years: 44.00    Types: Cigarettes    Quit date: 07/07/1984  . Smokeless tobacco: Never Used  . Alcohol use No  . Drug use: No  . Sexual activity: Not on file   Other Topics Concern  . Not on file   Social History Narrative  . No narrative on file    Allergies  Allergen Reactions  . Tramadol Nausea And Vomiting and Other (See Comments)    Dry heaving, GI upset  . Ace Inhibitors Cough  . Codeine Nausea And Vomiting  . Simvastatin Other (See Comments)    Myalgia- high doses (takes 20mg  at home without issue)    Family History  Problem Relation Age of Onset  . Diabetes Mother   . Heart Problems Mother   . Stroke Father   . Cancer Sister     Prior to Admission medications   Medication Sig Start Date End Date Taking? Authorizing Provider  amiodarone (PACERONE) 200 MG tablet Take 1 tablet (200 mg total) by mouth 2 (two) times daily. 01/05/17  Yes Seiler, Amber K, NP  furosemide (LASIX) 20 MG tablet Take 1 tablet (20 mg total) by mouth daily. Patient taking differently: Take 40 mg by mouth 2 (two) times daily.  02/19/17  Yes Croitoru, Mihai, MD  iron polysaccharides (NU-IRON) 150 MG capsule Take 1 capsule (150 mg total) by mouth 2 (two) times daily. 08/22/15  Yes Reyne Dumas, MD  Menthol, Topical Analgesic, (BENGAY EX) Apply 1 application topically as needed (for pain).   Yes [provider]  metoprolol succinate (TOPROL-XL) 50 MG 24 hr tablet TAKE 2 TABLETS IN THE MORNING AND 1 TABLET IN THE EVENING Patient taking differently: TAKE 100mg  IN THE MORNING AND 50mg T IN THE EVENING 03/02/17  Yes Croitoru, Mihai, MD  nitroGLYCERIN (NITROSTAT) 0.4 MG SL tablet Place 0.4 mg under the tongue every 5 (five) minutes as needed.     Yes [provider]  pantoprazole (PROTONIX) 40 MG  tablet Take 1 tablet (40 mg total) by mouth 2 (two) times daily. 08/22/15  Yes Reyne Dumas, MD  potassium chloride (K-DUR) 10 MEQ tablet Take 1 tablet (10 mEq total) by mouth daily. Patient taking differently: Take 10 mEq by mouth 2 (two) times daily.  02/19/17 05/20/17 Yes Croitoru, Mihai, MD  pravastatin (PRAVACHOL) 40 MG tablet Take 1 tablet (40 mg total) by mouth daily. 03/12/17 06/10/17 Yes Baldwin Jamaica, PA-C  ranolazine (RANEXA) 500 MG 12 hr tablet Take 1 tablet (500 mg total) by mouth 2 (two) times daily. 05/04/13  Yes Cherene Altes, MD  simvastatin (ZOCOR) 20 MG tablet Take 20 mg by mouth daily.   Yes [provider]    Physical Exam: Vitals:   03/17/17 0830 03/17/17 0900 03/17/17 0930 03/17/17 0945  BP: (!) 122/97 117/69 119/71 124/64  Pulse: 66 62 61 63  Resp: 20 19 16 17   Temp:      TempSrc:      SpO2: 93% 93% 94% 93%  Weight:  Height:         General:  Appears calm and comfortable sLightly pale and frail Eyes:  PERRL, EOMI, normal lids, iris ENT:  grossly normal hearing, lips & tongue, his membranes of his mouth are pink but dry very poor dentition Neck:  no LAD, masses or thyromegaly Cardiovascular:  RRR, no m/r/g. Right leg with 2+ pitting edema left leg with 1+ pitting edema lower extremities pale cool Respiratory:  Mild increased work of breathing with conversation. Breath sounds slightly diminished crackles bilateral bases some faint rhonchi throughout Abdomen:  soft, ntnd, positive bowel sounds no guarding or rebounding Skin:  no rash or induration seen on limited exam Musculoskeletal:  grossly normal tone BUE/BLE, good ROM, no bony abnormality Psychiatric:  grossly normal mood and affect, speech fluent and appropriate, AOx3 Neurologic:  CN 2-12 grossly intact, moves all extremities in coordinated fashion, sensation intact speech clear facial symmetry  Labs on Admission: I have personally reviewed following labs and imaging  studies  CBC:  Recent Labs Lab 03/12/17 1051 03/17/17 0807  WBC  --  7.6  NEUTROABS  --  6.2  HGB 7.9* 7.7*  HCT 27.0* 26.9*  MCV  --  82.3  PLT  --  250   Basic Metabolic Panel:  Recent Labs Lab 03/11/17 0957 03/17/17 0807  NA 142 136  K 4.0 3.4*  CL 97 99*  CO2 28 29  GLUCOSE 119* 131*  BUN 16 18  CREATININE 1.12 1.13  CALCIUM 9.5 8.6*   GFR: Estimated Creatinine Clearance: 50.6 mL/min (by C-G formula based on SCr of 1.13 mg/dL). Liver Function Tests: No results for input(s): AST, ALT, ALKPHOS, BILITOT, PROT, ALBUMIN in the last 168 hours. No results for input(s): LIPASE, AMYLASE in the last 168 hours. No results for input(s): AMMONIA in the last 168 hours. Coagulation Profile: No results for input(s): INR, PROTIME in the last 168 hours. Cardiac Enzymes: No results for input(s): CKTOTAL, CKMB, CKMBINDEX, TROPONINI in the last 168 hours. BNP (last 3 results)  Recent Labs  01/01/17 1254  PROBNP 3,145*   HbA1C: No results for input(s): HGBA1C in the last 72 hours. CBG: No results for input(s): GLUCAP in the last 168 hours. Lipid Profile: No results for input(s): CHOL, HDL, LDLCALC, TRIG, CHOLHDL, LDLDIRECT in the last 72 hours. Thyroid Function Tests: No results for input(s): TSH, T4TOTAL, FREET4, T3FREE, THYROIDAB in the last 72 hours. Anemia Panel: No results for input(s): VITAMINB12, FOLATE, FERRITIN, TIBC, IRON, RETICCTPCT in the last 72 hours. Urine analysis:    Component Value Date/Time   COLORURINE AMBER (A) 08/21/2015 1659   APPEARANCEUR CLEAR 08/21/2015 1659   LABSPEC 1.024 08/21/2015 1659   PHURINE 7.5 08/21/2015 1659   GLUCOSEU NEGATIVE 08/21/2015 1659   HGBUR NEGATIVE 08/21/2015 1659   BILIRUBINUR SMALL (A) 08/21/2015 1659   KETONESUR NEGATIVE 08/21/2015 1659   PROTEINUR NEGATIVE 08/21/2015 1659   UROBILINOGEN 4 (H) 01/17/2013 1605   NITRITE NEGATIVE 08/21/2015 1659   LEUKOCYTESUR NEGATIVE 08/21/2015 1659    Creatinine  Clearance: Estimated Creatinine Clearance: 50.6 mL/min (by C-G formula based on SCr of 1.13 mg/dL).  Sepsis Labs: @LABRCNTIP (procalcitonin:4,lacticidven:4) )No results found for this or any previous visit (from the past 240 hour(s)).   Radiological Exams on Admission: Dg Chest Port 1 View  Result Date: 03/17/2017 CLINICAL DATA:  Shortness of breath. EXAM: PORTABLE CHEST 1 VIEW COMPARISON:  05/22/2016 . FINDINGS: AICD in stable position. Prior CABG. Stable cardiomegaly. Low lung volumes with bibasilar atelectasis/infiltrate. Persistent left base density consistent  with scarring. IMPRESSION: 1. Low lung volumes with bibasilar atelectasis/infiltrates. Persistent left base density consistent with scarring. 2.  AICD in stable position.  Prior CABG.  Stable cardiomegaly. Electronically Signed   By: Marcello Moores  Register   On: 03/17/2017 07:29    EKG: Independently reviewed. Ventricular-paced complexes No further analysis attempted due to paced rhythm No significant change since last tracing  Assessment/Plan Principal Problem:   Acute respiratory distress Active Problems:   Ischemic cardiomyopathy- EF improved to 40-45% 10/14   Generalized weakness   ASCVD (arteriosclerotic cardiovascular disease)   PVD (peripheral vascular disease)- AAA R&G Nov 2010   Dyslipidemia   Anemia, chronic disease   Biventricular cardiac pacemaker in situ   Persistent atrial fibrillation (HCC)   GERD (gastroesophageal reflux disease)   Hyperlipidemia   Hypertension   Hypokalemia   Paraplegia ([place])   #1. Acute respiratory distress likely related to acute on chronic systolic heart failure in the setting of worsening anemia. Patient with sitting shortness of breath over the last several weeks. Chest x-ray reveals low lung volumes with bibasilar atelectasis/infiltrate stable cardiomegaly. He was provided 40 mg a Lasix in the emergency department. Chart review indicates he is evaluated by cardiology last week and  medications adjusted at that time -Admit -Oxygen supplementation -Continue IV Lasix -Intake and output -Daily weights -Nebulizer 2 -Cardiology consult  #2. Acute on chronic systolic heart failure. Recent TEE reveals EF of 30%, not overtly to severely reduced systolic function, regional wall motion abnormalities. Weights 173 pounds chart review indicates target 170 pounds. BNP 682. EKG as noted above. Initial troponin negative. He was provided with 40 mg Lasix IV in the emergency department -Continue IV Lasix -Daily weights -Intake and output -Continue amiodarone, metoprolol -Of note chart review indicates patient has been closely followed by cardiology in several adjustments have been made to medications -Await cardiology recommendations  #3. Anemia of chronic disease/iron deficiency anemia. History of same. Hemoglobin 7.7 on admission. Chart review indicates this is down from 9.8 last month. Patient states he has had a GI workup outpatient -FOBT -Transfuse 1 unit of packed blood cells -Monitor  #4. A. fib status post cardioversion. Chadvasc score 5. Not on anticoagulation at this point secondary to recurrent anemia. EKG as noted above. Home meds include amiodarone metoprolol -Continue home meds -Monitor  #5. CAD. No chest pain. EKG as noted above. Last 2014. -Continue home meds  #6. Hypertension. Fair control in the emergency department. -Continue home meds    DVT prophylaxis: lovenox Code Status: dnr  Family Communication: none present  Disposition Plan: home  Consults called: cardiology  Admission status: inpatient    Radene Gunning MD [facility]  If 7PM-7AM, please contact night-coverage [email or web address] Password [email or web address]  03/17/2017, 10:12 AM

## 2017-03-17 NOTE — Progress Notes (Signed)
New Admission Note: Pt admitted the unit from [place]: via bed Mental Orientation: Alert and oriented x 4 Telemetry: N/A Assessment: Completed Skin: Intact IV: R fa SL Pain: Denies Tubes: None Safety Measures: Safety Fall Prevention Plan has been discussed  Admission: To be completed  6 [place] Orientation: Patient has been orientated to the room, unit and staff.  Family: None at bedside  Orders to be reviewed and implemented. Will continue to monitor the patient. Call light has been placed within reach and bed alarm has been activated.   Mady Gemma, BSN, RN-BC Phone: [phone]

## 2017-03-17 NOTE — ED Notes (Signed)
Pt placed on bedpan

## 2017-03-17 NOTE — ED Notes (Signed)
ED Provider at bedside. 

## 2017-03-17 NOTE — ED Notes (Signed)
Heart PA at bedside.

## 2017-03-17 NOTE — Consult Note (Signed)
Cardiology Consultation:   Patient ID: Dakota Liu; [id number]; Jul 27, 1927   Admit date: 03/17/2017 Date of Consult: 03/17/2017  Primary Care Provider: Christain Sacramento, MD Primary Cardiologist: Dr. Sallyanne Kuster Primary Electrophysiologist:  n/a   Patient Profile:   Dakota Liu is a 83 y.o. male with a hx of Ischemic DCM with chronic combined systolic/diastolic CHF, CAD (CABG  In 1988, stent 2011, cutting balloon angioplasty 2012), PAD, persistent Afib, VT, CRT-P, hx of recurrent anemia no longer on anticoagulation due to bleeding, HTN and HLD who is being seen today for the evaluation of CHF exacerbation at the request of Dr. Marily Memos.   History of Present Illness:   Dakota Liu last saw Tommye Standard PA-C in the office for a general cardiology follow-up appointment on March 12, 2017. At that time his CRT-P was increased from 4.5 to 5.5V to ensure LV capture.  His weight was noted to be up by 4 lbs. His Lasix was increased to 40 mg BID for 5 days then back down to 20mg . He has CAD but has not had angina (on BB, Ranexa, Statin). Dakota Liu has persistent AFib and  had a recent TEE with successful DCCV on 01/12/2017.  His CHA2DS2 Vasc is at least 5 but he has not been able to be maintained on a/c 2/2 recurrent anemia. He continues Amiodarone.  The patient presented to the ER today because of gradually worsening SOB over the past few days with weight gain, and stated that his Lasix was not helping and he was getting weaker. He denies any CP, palpitations, headache, dizziness, syncope or near syncope. His ER work-up revealed O2 at 94%, afebrile, tachypnic. He was given 40 mg IV lasix with order to transfuse 1 unit of PRBCs for Hbg 7.6. BNP 682.5, Trop 0.00.  He currently is feeling improved, has diuresed 349mL, feels like his breathing is improved somewhat and that his lower extremity swelling is somewhat improved.   Past Medical History:  Diagnosis Date  . Anemia   . Anemia, iron  deficiency 11/21/2011  . CAD (coronary artery disease) '88, 12/11, 3/12   CABG'88, PCI 12/1, 3/12  . CHF (congestive heart failure) ([place])   . Dyslipidemia   . Epididymitis   . HTN (hypertension)   . Ischemic cardiomyopathy 10/14   EF improved to 40-45% after BiV ICD  . LBBB (left bundle branch block)   . Lymphoma ([place])    with metal radiation about 2006  . PAF (paroxysmal atrial fibrillation) ([place]) 12/11  . PVD (peripheral vascular disease) ([place]) 11/10   AAA R&G  . VT (ventricular tachycardia) ([place]) 12/11    Past Surgical History:  Procedure Laterality Date  . ABDOMINAL AORTIC ANEURYSM REPAIR  11/10  . CARDIAC DEFIBRILLATOR PLACEMENT  12/11  . CARDIOVERSION N/A 05/20/2016   Procedure: CARDIOVERSION;  Surgeon: Sanda Klein, MD;  Location: [facility];  Service: Cardiovascular;  Laterality: N/A;  . CARDIOVERSION N/A 01/12/2017   Procedure: CARDIOVERSION;  Surgeon: Sanda Klein, MD;  Location: [place];  Service: Cardiovascular;  Laterality: N/A;  . CORONARY ANGIOPLASTY WITH STENT PLACEMENT  07/05/10, 3/12   RCA with ISR 3/12  . CORONARY ARTERY BYPASS GRAFT  1988  . EP IMPLANTABLE DEVICE N/A 10/09/2015   Procedure: ICD to [place];  Surgeon: Sanda Klein, MD;  Location: [facility] CV LAB;  Service: Cardiovascular;  Laterality: N/A;  . LEFT HEART CATHETERIZATION WITH CORONARY/GRAFT ANGIOGRAM N/A 05/03/2013   Procedure: LEFT HEART CATHETERIZATION WITH Beatrix Fetters;  Surgeon: Collier Salina  M Martinique, MD;  Location: [facility] CATH LAB;  Service: Cardiovascular;  Laterality: N/A;  . ORCHIECTOMY    . TEE WITHOUT CARDIOVERSION N/A 05/20/2016   Procedure: TRANSESOPHAGEAL ECHOCARDIOGRAM (TEE);  Surgeon: Sanda Klein, MD;  Location: [facility] ENDOSCOPY;  Service: Cardiovascular;  Laterality: N/A;  . TEE WITHOUT CARDIOVERSION N/A 01/12/2017   Procedure: TRANSESOPHAGEAL ECHOCARDIOGRAM (TEE);  Surgeon: Sanda Klein, MD;  Location: [facility] ENDOSCOPY;  Service: Cardiovascular;   Laterality: N/A;  . TONSILLECTOMY       Inpatient Medications: Scheduled Meds: . albuterol  2.5 mg Nebulization Q6H  . amiodarone  200 mg Oral BID  . enoxaparin (LOVENOX) injection  40 mg Subcutaneous Daily  . furosemide  40 mg Intravenous BID  . iron polysaccharides  150 mg Oral BID  . magnesium hydroxide  30 mL Oral BID  . metoprolol succinate  50 mg Oral Daily  . pantoprazole  40 mg Oral BID  . potassium chloride  10 mEq Oral BID  . pravastatin  40 mg Oral Daily  . ranolazine  500 mg Oral BID  . senna-docusate  1 tablet Oral BID  . sodium chloride flush  3 mL Intravenous Q12H   Continuous Infusions: . sodium chloride    . sodium chloride     PRN Meds: sodium chloride, acetaminophen, ondansetron (ZOFRAN) IV, sodium chloride flush  Allergies:    Allergies  Allergen Reactions  . Tramadol Nausea And Vomiting and Other (See Comments)    Dry heaving, GI upset  . Ace Inhibitors Cough  . Codeine Nausea And Vomiting  . Simvastatin Other (See Comments)    Myalgia- high doses (takes 20mg  at home without issue)    Social History:   Social History   Social History  . Marital status: Married    Spouse name: N/A  . Number of children: N/A  . Years of education: N/A   Occupational History  . Not on file.   Social History Main Topics  . Smoking status: Former Smoker    Packs/day: 1.00    Years: 44.00    Types: Cigarettes    Quit date: 07/07/1984  . Smokeless tobacco: Never Used  . Alcohol use No  . Drug use: No  . Sexual activity: Not on file   Other Topics Concern  . Not on file   Social History Narrative  . No narrative on file    Family History:   Family History  Problem Relation Age of Onset  . Diabetes Mother   . Heart Problems Mother   . Stroke Father   . Cancer Sister      ROS:  Please see the history of present illness.  ROS  All other ROS reviewed and negative.     Physical Exam/Data:   Vitals:   03/17/17 1242 03/17/17 1259 03/17/17 1300  03/17/17 1315  BP: 122/62 114/61 130/62 132/68  Pulse: 70 70 69 67  Resp: 18 18 19 18   Temp: (!) 97.5 F (36.4 C) (!) 97.4 F (36.3 C)    TempSrc: Oral Oral    SpO2: 95% 96% 94% 94%  Weight:      Height:       No intake or output data in the 24 hours ending 03/17/17 1357 Filed Weights   03/17/17 0639  Weight: 173 lb (78.5 kg)   Body mass index is 23.46 kg/m.  General:  Well nourished, well developed, frail elderly male, pale HEENT: normal Lymph: no adenopathy Neck: + JVD Endocrine:  No thryomegaly Vascular: No carotid bruits;  FA pulses 2+ bilaterally without bruits  Cardiac:  normal S1, S2; RRR; no murmur  Lungs:  clear to auscultation bilaterally, no wheezing, rhonchi or rales  Abd: soft, nontender, no hepatomegaly  Ext: + 2 bilateral le edema Musculoskeletal:  No deformities, BUE and BLE strength normal and equal Skin: warm and dry  Neuro:  CNs 2-12 intact, no focal abnormalities noted Psych:  Normal affect   EKG:  The EKG was personally reviewed and demonstrates:  Ventricular paced complexes. Telemetry:  Telemetry was personally reviewed and demonstrates:  Ventricular paced rhythm with afib.  Relevant CV Studies:  PPM interrogation done today and reviewed by myself: battery and lead measurements appear stable from previous.  LV lead thresholds appear known to be high, his last EKG looks like BiVe pacing.  He is back in AF with reduced BIVE pacing % <90 Appears he had AF 03/04/17 for some days, then again 03/09/17 to now No VT Optivol is c/w fluid OL   01/12/17: TEE/DCCV Study Conclusions - Left ventricle: The cavity size was normal. Wall thickness was normal. Systolic function was moderately to severely reduced. The estimated ejection fraction was in the range of 30% to 35%. Akinesis and scarring of the mid-apicalanteroseptal, anterior, and apical myocardium; consistent with infarction in the distribution of the left anterior descending coronary artery.  No evidence of thrombus. - Aortic valve: No evidence of vegetation. - Mitral valve: No evidence of vegetation. There was mild regurgitation. - Left atrium: The atrium was moderately dilated. No evidence of thrombus in the atrial cavity or appendage. There was mildcontinuous spontaneous echo contrast (&quot;smoke&quot;) in the cavity. - Right atrium: No evidence of thrombus in the atrial cavity or appendage. - Atrial septum: No defect or patent foramen ovale was identified. - Tricuspid valve: No evidence of vegetation. - Pulmonic valve: No evidence of vegetation. Impressions: - Successful cardioversion. No cardiac source of emboli was indentified.    Laboratory Data:  Chemistry Recent Labs Lab 03/11/17 0957 03/17/17 0807  NA 142 136  K 4.0 3.4*  CL 97 99*  CO2 28 29  GLUCOSE 119* 131*  BUN 16 18  CREATININE 1.12 1.13  CALCIUM 9.5 8.6*  GFRNONAA 59* 56*  GFRAA 68 >60  ANIONGAP  --  8    No results for input(s): PROT, ALBUMIN, AST, ALT, ALKPHOS, BILITOT in the last 168 hours. Hematology Recent Labs Lab 03/12/17 1051 03/17/17 0807  WBC  --  7.6  RBC  --  3.27*  HGB 7.9* 7.7*  HCT 27.0* 26.9*  MCV  --  82.3  MCH  --  23.5*  MCHC  --  28.6*  RDW  --  19.5*  PLT  --  211   Cardiac EnzymesNo results for input(s): TROPONINI in the last 168 hours.  Recent Labs Lab 03/17/17 0806  TROPIPOC 0.00    BNP Recent Labs Lab 03/17/17 0808  BNP 682.5*    DDimer No results for input(s): DDIMER in the last 168 hours.  Radiology/Studies:  Dg Chest Port 1 View  Result Date: 03/17/2017 CLINICAL DATA:  Shortness of breath. EXAM: PORTABLE CHEST 1 VIEW COMPARISON:  05/22/2016 . FINDINGS: AICD in stable position. Prior CABG. Stable cardiomegaly. Low lung volumes with bibasilar atelectasis/infiltrate. Persistent left base density consistent with scarring. IMPRESSION: 1. Low lung volumes with bibasilar atelectasis/infiltrates. Persistent left base density consistent  with scarring. 2.  AICD in stable position.  Prior CABG.  Stable cardiomegaly. Electronically Signed   By: Marcello Moores  Register   On: 03/17/2017  07:29    Assessment and Plan:    Acute on chronic systolic congestive heart failure: This is a difficult situation is difficult because he cannot be anticoagulated and cannot have DCCV again unless anticoagulated but it is likely that his AF is likely contributiing to his fluid overload with loss of atrial kick. The patient at this appointment preferred to be managed with just medications and to avoid the transfusions and being on/off blood thinners and cardioversion. EF 30-35% 01/12/2017 -- Recommend IV lasix 40 mg BID IV -- Continue Amiodarone 200 mg daily and Toprol XL 50mg  daily. -- Strict I/Os, daily weights and watch electrolytes.  Persistent Atrial Fibrillation:  Appears he had AF 03/04/17 for some days, then again 03/09/17 - 9/6 and he has it now CHA2DS2Vasc is at least 5, unable to maintain on a/c 2/2 recurrent anemia  Symptomatic Anemia: Transfused 1 unit PRBCs with 40 mg IV Lasix,. -- etiology uncertain -- be sure to diurese if given further  blood transfusions.  CAD (CABG  In 1988, stent 2011, cutting balloon angioplasty 2012: Denies having had any chest pains.   Kristopher Glee, PA-C  03/17/2017   Patient seen and independently examined with Tiffany G. We discussed all aspects of the encounter. I agree with the assessment and plan as stated above.  Very complicated patient with history of chronic systolic CHF and ischemic DCM with EF 30-35% s/p CRT-P and recently difficulty maintaining NSR with persistent atrial fibrillation.  He was recently DCCV to NSR but when seen back in office last week was back in afib. He cannot be chronically anticoagulated due to anemia requiring transfusions.  He says that he has no interest in being anticoagulated again for DCCV and having to go through transfusion.  He has felt more SOB recently and over the  weekend developed increased SOB, LE edema and weight gain.  He has noticed decreased UOP and says that he has not been getting up at night to urinate.  In ER his cxray showed atelectasis with possible infiltrate.  BNP is elevated at 682 and Hb 7.7.  Exam demonstrates lung with crackles at the bases bilaterally, heart irregularly irregular with no M/R/G, 2-3+ LLE edema and 1+ RLE.  His LLE is cool too touch and pulses are unable to be palpated due to edema.  He states that his LLE is always cool to touch and no different from normal.   He clearly appears to be in acute on chronic systolic CHF with elevated BNP, increased LE edema, weight gain and SOB.  Likely this has been exacerbated by recent reversion back to afib with loss of atrial kick as well as significant anemia.  His heart rate is controlled in afib currently on PO Amio.  Not many options left.  He clearly does better in NSR but he would have to be anticoagulated and with significant anemia and Hgb 7 this admission this is not an option and patient states that he is not going to take blood thinners again.  Will start Lasix 40mg  IV BID and follow renal function and daily weights.  Continue Toprol.  He aldactone was stopped due to gynecomastia and his ARB was stopped due to hypotension.  His BP is good this admission so may be able to add back once diuresed.

## 2017-03-17 NOTE — ED Triage Notes (Signed)
Pt comes from home via [facility], woke up from his sleep feeling SOB while lying flat, pt took two of his nitro PTA even though he was not having any CP. Rhonchi in lower lobes, stats 80% on RA, placed on non-rebreather. Productive cough for the past 4-5 days, denies fevers. Hx of CHF

## 2017-03-17 NOTE — ED Notes (Signed)
Patient is resting comfortably. 

## 2017-03-17 NOTE — ED Provider Notes (Signed)
[facility] DEPT Provider Note   CSN: [id number] Arrival date & time: 03/17/17  0630     History   Chief Complaint Chief Complaint  Patient presents with  . Shortness of Breath    HPI Dakota Liu is a 83 y.o. male.  HPI Complains of cough and shortness of breath for several months becoming worse over the past 2 weeks. Cough productive of white sputum. Symptoms worse with lying supine. Improved with sitting up. He treated himself with 2 sublingual nitroglycerin this morning with partial relief. EMS treated patient with supplemental oxygen.denies fever as chest pain denies other associated symptoms Past Medical History:  Diagnosis Date  . Anemia, iron deficiency 11/21/2011  . CAD (coronary artery disease) '88, 12/11, 3/12   CABG'88, PCI 12/1, 3/12  . CHF (congestive heart failure) ([place])   . Dyslipidemia   . Epididymitis   . HTN (hypertension)   . Ischemic cardiomyopathy 10/14   EF improved to 40-45% after BiV ICD  . LBBB (left bundle branch block)   . Lymphoma ([place])    with metal radiation about 2006  . PAF (paroxysmal atrial fibrillation) ([place]) 12/11  . PVD (peripheral vascular disease) ([place]) 11/10   AAA R&G  . VT (ventricular tachycardia) ([place]) 12/11    Patient Active Problem List   Diagnosis Date Noted  . Drug-induced gynecomastia 09/17/2016  . Persistent atrial fibrillation ([place]) 05/07/2016  . Actinic keratitis of right eye 04/01/2016  . Hypokalemia 03/05/2016  . Biventricular cardiac pacemaker in situ 01/24/2016  . Anemia, chronic disease 08/21/2015  . AAA (abdominal aortic aneurysm) ([place]) 08/20/2015  . Encounter for long-term (current) use of high-risk medication 08/20/2015  . Foot drop 08/20/2015  . GERD (gastroesophageal reflux disease) 08/20/2015  . Hyperlipidemia 08/20/2015  . Hypertension 08/20/2015  . Malaise and fatigue 08/20/2015  . 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 Peripheral neuropathy 08/20/2015  .  Paraplegia ([place]) 08/20/2015  . UTI (urinary tract infection) 05/31/2013  . ASCVD (arteriosclerotic cardiovascular disease) 05/03/2013  . LBBB (left bundle branch block) 05/03/2013  . PVD (peripheral vascular disease)- AAA R&G Nov 2010 05/03/2013  . Dyslipidemia 05/03/2013  . VT (ventricular tachycardia) - 12/11 05/03/2013  . Generalized weakness 01/17/2013  . Dark urine 01/17/2013  . Effects of radiation 06/07/2012  . 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 Anemia, iron deficiency 11/21/2011  . Ischemic cardiomyopathy- EF improved to 40-45% 10/14 10/10/2010  . Chronic systolic heart failure ([place]) 10/10/2010    Past Surgical History:  Procedure Laterality Date  . ABDOMINAL AORTIC ANEURYSM REPAIR  11/10  . CARDIAC DEFIBRILLATOR PLACEMENT  12/11  . CARDIOVERSION N/A 05/20/2016   Procedure: CARDIOVERSION;  Surgeon: Sanda Klein, MD;  Location: [facility] ENDOSCOPY;  Service: Cardiovascular;  Laterality: N/A;  . CARDIOVERSION N/A 01/12/2017   Procedure: CARDIOVERSION;  Surgeon: Sanda Klein, MD;  Location: [facility];  Service: Cardiovascular;  Laterality: N/A;  . CORONARY ANGIOPLASTY WITH STENT PLACEMENT  07/05/10, 3/12   RCA with ISR 3/12  . CORONARY ARTERY BYPASS GRAFT  1988  . EP IMPLANTABLE DEVICE N/A 10/09/2015   Procedure: ICD to [place];  Surgeon: Sanda Klein, MD;  Location: [facility] CV LAB;  Service: Cardiovascular;  Laterality: N/A;  . LEFT HEART CATHETERIZATION WITH CORONARY/GRAFT ANGIOGRAM N/A 05/03/2013   Procedure: LEFT HEART CATHETERIZATION WITH Beatrix Fetters;  Surgeon: Peter M Martinique, MD;  Location[facility] CATH LAB;  Service: Cardiovascular;  Laterality: N/A;  . ORCHIECTOMY    .  TEE WITHOUT CARDIOVERSION N/A 05/20/2016   Procedure: TRANSESOPHAGEAL ECHOCARDIOGRAM (TEE);  Surgeon: Sanda Klein, MD;  Location: [facility];  Service: Cardiovascular;  Laterality: N/A;  . TEE WITHOUT CARDIOVERSION N/A 01/12/2017   Procedure:  TRANSESOPHAGEAL ECHOCARDIOGRAM (TEE);  Surgeon: Sanda Klein, MD;  Location: [facility] ENDOSCOPY;  Service: Cardiovascular;  Laterality: N/A;  . TONSILLECTOMY         Home Medications    Prior to Admission medications   Medication Sig Start Date End Date Taking? Authorizing Provider  amiodarone (PACERONE) 200 MG tablet Take 1 tablet (200 mg total) by mouth 2 (two) times daily. 01/05/17   Chanetta Marshall K, NP  furosemide (LASIX) 20 MG tablet Take 1 tablet (20 mg total) by mouth daily. 02/19/17   Croitoru, Mihai, MD  iron polysaccharides (NU-IRON) 150 MG capsule Take 1 capsule (150 mg total) by mouth 2 (two) times daily. 08/22/15   Reyne Dumas, MD  Menthol, Topical Analgesic, (BENGAY EX) Apply 1 application topically as needed (for pain).    [provider]  metoprolol succinate (TOPROL-XL) 50 MG 24 hr tablet TAKE 2 TABLETS IN THE MORNING AND 1 TABLET IN THE EVENING 03/02/17   Croitoru, Mihai, MD  nitroGLYCERIN (NITROSTAT) 0.4 MG SL tablet Place 0.4 mg under the tongue every 5 (five) minutes as needed.      [provider]  pantoprazole (PROTONIX) 40 MG tablet Take 1 tablet (40 mg total) by mouth 2 (two) times daily. 08/22/15   Reyne Dumas, MD  potassium chloride (K-DUR) 10 MEQ tablet Take 1 tablet (10 mEq total) by mouth daily. 02/19/17 05/20/17  Croitoru, Mihai, MD  pravastatin (PRAVACHOL) 40 MG tablet Take 1 tablet (40 mg total) by mouth daily. 03/12/17 06/10/17  Baldwin Jamaica, PA-C  ranolazine (RANEXA) 500 MG 12 hr tablet Take 1 tablet (500 mg total) by mouth 2 (two) times daily. 05/04/13   Cherene Altes, MD    Family History Family History  Problem Relation Age of Onset  . Diabetes Mother   . Heart Problems Mother   . Stroke Father   . Cancer Sister     Social History Social History  Substance Use Topics  . Smoking status: Former Smoker    Packs/day: 1.00    Years: 44.00    Types: Cigarettes    Quit date: 07/07/1984  . Smokeless tobacco: Never Used  . Alcohol  use No     Allergies   Tramadol; Ace inhibitors; Codeine; and Simvastatin   Review of Systems Review of Systems  Constitutional: Negative.   HENT: Negative.   Respiratory: Positive for cough and shortness of breath.   Cardiovascular: Positive for leg swelling.       Chronic leg edema  Gastrointestinal: Negative.   Musculoskeletal: Negative.   Skin: Negative.   Neurological: Negative.   Psychiatric/Behavioral: Negative.   All other systems reviewed and are negative.    Physical Exam Updated Vital Signs BP 123/69   Pulse 66   Temp 98.7 F (37.1 C) (Rectal)   Resp 18   Ht 6' (1.829 m)   Wt 78.5 kg (173 lb)   SpO2 100%   BMI 23.46 kg/m   Physical Exam  Constitutional:  Chronically ill-appearing  HENT:  Head: Normocephalic and atraumatic.  Eyes: Pupils are equal, round, and reactive to light. Conjunctivae are normal.  Neck: Neck supple. JVD present. No tracheal deviation present. No thyromegaly present.  Cardiovascular: Normal rate and regular rhythm.   No murmur heard. Pulmonary/Chest: Effort normal. No respiratory distress.  He has rales. He exhibits no tenderness.  Coughing frequently, rales at bases  Abdominal: Soft. Bowel sounds are normal. He exhibits no distension. There is no tenderness.  Musculoskeletal: Normal range of motion. He exhibits edema. He exhibits no tenderness.  3+ pretibial pitting edema bilaterally  Neurological: He is alert. Coordination normal.  Skin: Skin is warm and dry. No rash noted.  Psychiatric: He has a normal mood and affect.  Nursing note and vitals reviewed.    ED Treatments / Results  Labs (all labs ordered are listed, but only abnormal results are displayed) Labs Reviewed  BASIC METABOLIC PANEL  CBC WITH DIFFERENTIAL/PLATELET  BRAIN NATRIURETIC PEPTIDE  I-STAT TROPONIN, ED    EKG  EKG Interpretation  Date/Time:  Tuesday March 17 2017 06:52:24 EDT Ventricular Rate:  61 PR Interval:    QRS Duration: 166 QT  Interval:  497 QTC Calculation: 501 R Axis:   -128 Text Interpretation:  Ventricular-paced complexes No further analysis attempted due to paced rhythm No significant change since last tracing Reconfirmed by Orlie Dakin [id number]) on 03/17/2017 7:01:55 AM       Radiology No results found.  Procedures Procedures (including critical care time)  Medications Ordered in ED Medications - No data to display Chest x-ray viewed by me Results for orders placed or performed during the hospital encounter of 92/42/68  Basic metabolic panel  Result Value Ref Range   Sodium 136 135 - 145 mmol/L   Potassium 3.4 (L) 3.5 - 5.1 mmol/L   Chloride 99 (L) 101 - 111 mmol/L   CO2 29 22 - 32 mmol/L   Glucose, Bld 131 (H) 65 - 99 mg/dL   BUN 18 6 - 20 mg/dL   Creatinine, Ser 1.13 0.61 - 1.24 mg/dL   Calcium 8.6 (L) 8.9 - 10.3 mg/dL   GFR calc non Af Amer 56 (L) >60 mL/min   GFR calc Af Amer >60 >60 mL/min   Anion gap 8 5 - 15  CBC with Differential/Platelet  Result Value Ref Range   WBC 7.6 4.0 - 10.5 K/uL   RBC 3.27 (L) 4.22 - 5.81 MIL/uL   Hemoglobin 7.7 (L) 13.0 - 17.0 g/dL   HCT 26.9 (L) 39.0 - 52.0 %   MCV 82.3 78.0 - 100.0 fL   MCH 23.5 (L) 26.0 - 34.0 pg   MCHC 28.6 (L) 30.0 - 36.0 g/dL   RDW 19.5 (H) 11.5 - 15.5 %   Platelets 211 150 - 400 K/uL   Neutrophils Relative % 82 %   Neutro Abs 6.2 1.7 - 7.7 K/uL   Lymphocytes Relative 6 %   Lymphs Abs 0.4 (L) 0.7 - 4.0 K/uL   Monocytes Relative 12 %   Monocytes Absolute 0.9 0.1 - 1.0 K/uL   Eosinophils Relative 0 %   Eosinophils Absolute 0.0 0.0 - 0.7 K/uL   Basophils Relative 0 %   Basophils Absolute 0.0 0.0 - 0.1 K/uL  Brain natriuretic peptide  Result Value Ref Range   B Natriuretic Peptide 682.5 (H) 0.0 - 100.0 pg/mL  I-stat troponin, ED  Result Value Ref Range   Troponin i, poc 0.00 0.00 - 0.08 ng/mL   Comment 3           Dg Chest Port 1 View  Result Date: 03/17/2017 CLINICAL DATA:  Shortness of breath. EXAM: PORTABLE CHEST 1  VIEW COMPARISON:  05/22/2016 . FINDINGS: AICD in stable position. Prior CABG. Stable cardiomegaly. Low lung volumes with bibasilar atelectasis/infiltrate. Persistent left base density consistent  with scarring. IMPRESSION: 1. Low lung volumes with bibasilar atelectasis/infiltrates. Persistent left base density consistent with scarring. 2.  AICD in stable position.  Prior CABG.  Stable cardiomegaly. Electronically Signed   By: Marcello Moores  Register   On: 03/17/2017 07:29   Initial Impression / Assessment and Plan / ED Course  I have reviewed the triage vital signs and the nursing notes.  Pertinent labs & imaging results that were available during my care of the patient were reviewed by me and considered in my medical decision making (see chart for details).    Patient felt to be in congestive heart failure based on elevated BNP, neck vein distention and rails. IV Lasix ordered. He's also been typed and screened as he is anemic and may be symptomatic secondary to anemia.However before transfusion patient should be diuresed. I consulted hospitalistservice who will arrange for overnight stay. I also consultedcardiology service who willsee patient while in hospital. Oral potassium supplementation orderedby hospitalist  Final Clinical Impressions(s) / ED Diagnoses  diagnosis #1 congestive heart failure, acute on chronic #2 symptomatic anemia Final diagnoses:  None  #3 mild hypokalemia New Prescriptions New Prescriptions   No medications on file     Orlie Dakin, MD 03/17/17 [phone]

## 2017-03-17 NOTE — ED Notes (Signed)
Pharm contacted for rest of meds

## 2017-03-18 ENCOUNTER — Inpatient Hospital Stay (HOSPITAL_COMMUNITY): Payer: Medicare Other

## 2017-03-18 ENCOUNTER — Telehealth: Payer: Self-pay | Admitting: Cardiovascular Disease

## 2017-03-18 DIAGNOSIS — E785 Hyperlipidemia, unspecified: Secondary | ICD-10-CM

## 2017-03-18 DIAGNOSIS — E7800 Pure hypercholesterolemia, unspecified: Secondary | ICD-10-CM

## 2017-03-18 DIAGNOSIS — R410 Disorientation, unspecified: Secondary | ICD-10-CM

## 2017-03-18 DIAGNOSIS — I255 Ischemic cardiomyopathy: Secondary | ICD-10-CM

## 2017-03-18 LAB — URINALYSIS, ROUTINE W REFLEX MICROSCOPIC
Bilirubin Urine: NEGATIVE
GLUCOSE, UA: NEGATIVE mg/dL
Hgb urine dipstick: NEGATIVE
KETONES UR: NEGATIVE mg/dL
LEUKOCYTES UA: NEGATIVE
NITRITE: NEGATIVE
PH: 7 (ref 5.0–8.0)
Protein, ur: NEGATIVE mg/dL
SPECIFIC GRAVITY, URINE: 1.009 (ref 1.005–1.030)

## 2017-03-18 LAB — TYPE AND SCREEN
ABO/RH(D): O POS
Antibody Screen: NEGATIVE
Unit division: 0

## 2017-03-18 LAB — BASIC METABOLIC PANEL
ANION GAP: 8 (ref 5–15)
BUN: 17 mg/dL (ref 6–20)
CHLORIDE: 100 mmol/L — AB (ref 101–111)
CO2: 30 mmol/L (ref 22–32)
Calcium: 8.6 mg/dL — ABNORMAL LOW (ref 8.9–10.3)
Creatinine, Ser: 1.18 mg/dL (ref 0.61–1.24)
GFR calc non Af Amer: 54 mL/min — ABNORMAL LOW (ref >60)
GLUCOSE: 137 mg/dL — AB (ref 65–99)
Potassium: 3.1 mmol/L — ABNORMAL LOW (ref 3.5–5.1)
Sodium: 138 mmol/L (ref 135–145)

## 2017-03-18 LAB — CBC
HCT: 31.7 % — ABNORMAL LOW (ref 39.0–52.0)
HEMOGLOBIN: 9.2 g/dL — AB (ref 13.0–17.0)
MCH: 23.7 pg — AB (ref 26.0–34.0)
MCHC: 29 g/dL — ABNORMAL LOW (ref 30.0–36.0)
MCV: 81.7 fL (ref 78.0–100.0)
Platelets: 195 10*3/uL (ref 150–400)
RBC: 3.88 MIL/uL — AB (ref 4.22–5.81)
RDW: 19.3 % — ABNORMAL HIGH (ref 11.5–15.5)
WBC: 7 10*3/uL (ref 4.0–10.5)

## 2017-03-18 LAB — BPAM RBC
BLOOD PRODUCT EXPIRATION DATE: 201809122359
ISSUE DATE / TIME: 201809111227
Unit Type and Rh: 5100

## 2017-03-18 LAB — GLUCOSE, CAPILLARY: Glucose-Capillary: 140 mg/dL — ABNORMAL HIGH (ref 65–99)

## 2017-03-18 MED ORDER — LORAZEPAM 2 MG/ML IJ SOLN
1.0000 mg | Freq: Once | INTRAMUSCULAR | Status: DC | PRN
Start: 2017-03-18 — End: 2017-03-18

## 2017-03-18 MED ORDER — POTASSIUM CHLORIDE CRYS ER 20 MEQ PO TBCR
40.0000 meq | EXTENDED_RELEASE_TABLET | Freq: Two times a day (BID) | ORAL | Status: DC
  Administered 2017-03-18 – 2017-03-21 (×7): 40 meq via ORAL
  Filled 2017-03-18 (×7): qty 2

## 2017-03-18 MED ORDER — FUROSEMIDE 10 MG/ML IJ SOLN
INTRAMUSCULAR | Status: AC
  Filled 2017-03-18: qty 4

## 2017-03-18 MED ORDER — DIPHENHYDRAMINE HCL 25 MG PO CAPS: 25.0000 mg | ORAL_CAPSULE | Freq: Every evening | ORAL | Status: DC | PRN

## 2017-03-18 MED ORDER — FUROSEMIDE 10 MG/ML IJ SOLN
80.0000 mg | Freq: Two times a day (BID) | INTRAMUSCULAR | Status: DC
  Administered 2017-03-18 – 2017-03-21 (×6): 80 mg via INTRAVENOUS
  Filled 2017-03-18 (×6): qty 8

## 2017-03-18 NOTE — Progress Notes (Signed)
MD in to see pt, wife at bedside. Pt O X4 with MD, pt wanting to go home. MD and nurse able to talk with pt about need to stay and need for medication (lasix) to help swelling in legs. Pt agreed to stay. Pt took am meds.
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
## 2017-03-18 NOTE — Telephone Encounter (Signed)
New Message    Pt wife called pt has been admitted to the hospital, pt is very disoriented and acting crazy. She would like to know Dr C opinion on what he thinks is going on

## 2017-03-18 NOTE — Progress Notes (Signed)
Pt remains confused to situation and is convinced his wife is in the back room, but is alert to time and self. This nurse continues to reorient pt to current situation and is easily redirected. Will continue to monitor closely. Dorthey Sawyer, RN

## 2017-03-18 NOTE — Progress Notes (Signed)
Wife left around 4-4:30, pt got restless again around 5:30. Pt brought up to nursing station. There pt calm. Did not eat dinner, stated he wasn't hungry, drank little soda.    At 1850, lab here to draw blood, pt refused at this time. Asked lab to come back and try again little later.

## 2017-03-18 NOTE — Progress Notes (Signed)
Pt continues to have periods of confusion/poor judgement. Pt yelling out, looking for wife. She has called him on his cell phone. Wife called unit stating her husband has not been this way and wants [place] "to fix him before she gets here".  Attempted to explain "sundowners" to wife--as pt was oriented x4 last evening per night RN, but inappropriate and confused during the night. This am during report appropriate and oriented x4, but talked about how he saw his wife outside his window with another man. Unable to redirect pt that his wife was not out there, pt on 2nd floor of hospital.  Informed wife that nurse contacting his doctor and would have doctor talk with her.   Dr Alfredia Ferguson paged and informed of the above, would be rounding on him soon.

## 2017-03-18 NOTE — Progress Notes (Addendum)
Progress Note  Patient Name: Dakota Liu Date of Encounter: 03/18/2017  Primary Cardiologist: Dr. Sallyanne Kuster  Subjective   Patient appears very agitated and combative this am.  His wife is in the room and states that he is make false accusations about her.  He demands to go home today.   Inpatient Medications    Scheduled Meds: . amiodarone  200 mg Oral BID  . enoxaparin (LOVENOX) injection  40 mg Subcutaneous Daily  . furosemide  40 mg Intravenous BID  . iron polysaccharides  150 mg Oral BID  . metoprolol succinate  50 mg Oral Daily  . pantoprazole  40 mg Oral BID  . potassium chloride  40 mEq Oral BID  . pravastatin  40 mg Oral Daily  . ranolazine  500 mg Oral BID  . senna-docusate  1 tablet Oral BID  . sodium chloride flush  3 mL Intravenous Q12H   Continuous Infusions: . sodium chloride    . sodium chloride     PRN Meds: sodium chloride, acetaminophen, ondansetron (ZOFRAN) IV, sodium chloride flush   Vital Signs    Vitals:   03/17/17 1930 03/17/17 2015 03/17/17 2144 03/18/17 0858  BP: 118/70 132/87 137/76 (!) 127/109  Pulse: 68 65 78 74  Resp: (!) 24 19 20 18   Temp:   98.9 F (37.2 C) 98.7 F (37.1 C)  TempSrc:   Oral Oral  SpO2: 93% 95% 98% 96%  Weight:   173 lb 4.5 oz (78.6 kg)   Height:        Intake/Output Summary (Last 24 hours) at 03/18/17 1033 Last data filed at 03/18/17 0700  Gross per 24 hour  Intake              315 ml  Output              950 ml  Net             -635 ml   Filed Weights   03/17/17 0639 03/17/17 2144  Weight: 173 lb (78.5 kg) 173 lb 4.5 oz (78.6 kg)    Telemetry    Atrial fibrillation with V paced rhythm - Personally Reviewed  ECG    No new EKG to review - Personally Reviewed  Physical Exam   GEN: No acute distress.   Neck: No JVD Cardiac: RRR, no murmurs, rubs, or gallops.  Respiratory: Crackles at bases with decreased BS  GI: Soft, nontender, non-distended  MS: 3+ edema LLE, trace edema RLE Neuro:   Nonfocal  Psych: Normal affect   Labs    Chemistry Recent Labs Lab 03/17/17 0807 03/18/17 0456  NA 136 138  K 3.4* 3.1*  CL 99* 100*  CO2 29 30  GLUCOSE 131* 137*  BUN 18 17  CREATININE 1.13 1.18  CALCIUM 8.6* 8.6*  GFRNONAA 56* 54*  GFRAA >60 >60  ANIONGAP 8 8     Hematology Recent Labs Lab 03/17/17 0807 03/17/17 1939 03/18/17 0456  WBC 7.6 8.6 7.0  RBC 3.27* 3.77* 3.88*  HGB 7.7* 8.9* 9.2*  HCT 26.9* 30.7* 31.7*  MCV 82.3 81.4 81.7  MCH 23.5* 23.6* 23.7*  MCHC 28.6* 29.0* 29.0*  RDW 19.5* 18.9* 19.3*  PLT 211 213 195    Cardiac EnzymesNo results for input(s): TROPONINI in the last 168 hours.  Recent Labs Lab 03/17/17 0806  TROPIPOC 0.00     BNP Recent Labs Lab 03/17/17 0808  BNP 682.5*     DDimer No results for input(s): DDIMER  in the last 168 hours.   Radiology    Dg Chest Port 1 View  Result Date: 03/17/2017 CLINICAL DATA:  Shortness of breath. EXAM: PORTABLE CHEST 1 VIEW COMPARISON:  05/22/2016 . FINDINGS: AICD in stable position. Prior CABG. Stable cardiomegaly. Low lung volumes with bibasilar atelectasis/infiltrate. Persistent left base density consistent with scarring. IMPRESSION: 1. Low lung volumes with bibasilar atelectasis/infiltrates. Persistent left base density consistent with scarring. 2.  AICD in stable position.  Prior CABG.  Stable cardiomegaly. Electronically Signed   By: Marcello Moores  Register   On: 03/17/2017 07:29    Cardiac Studies   none  Patient Profile     83 y.o. male with a hx of Ischemic DCM with chronic combined systolic/diastolic CHF, CAD (CABG  In 1988, stent 2011, cutting balloon angioplasty 2012), PAD, persistent Afib, VT, CRT-P, hx of recurrent anemia no longer on anticoagulation due to bleeding, HTN and HLD who is being seen today for the evaluation of CHF exacerbation   Assessment & Plan    Acute on chronic systolic congestive heart failure: This is a difficult situation because he cannot be anticoagulated due to  anemia and cannot have DCCV again unless anticoagulated. His AF is likely contributiing to his fluid overload with loss of atrial kick. I suspect his anemia is also contributing and his Hb was 7 on admission and is now s/p transfusion. He is adamant that he will not go back on blood thinners to get back in NSR.   EF 30-35% 01/12/2017 -- he put out 950cc yesterday and is net neg -635cc. -- creatinine stable at 1.18 -- Will increase lasix to 80mg  IV BID -- Continue Toprol XL 50mg  daily. -- Strict I/Os, daily weights and watch electrolytes. -- currently not on aldactone due to gynecomastia  -- ARB had been stopped due to hypotension but BP is stable this admit -- consider restarting ARB low dose prior to discharge if BP stable once diuresed.  Persistent Atrial Fibrillation:  now back in afib s/p recent DCCV -- CHA2DS2Vasc is at least 5, unable to maintain on a/c 2/2 recurrent anemia -- he is not interested in going back on anticoagulation for a brief time for repeat DCCV and with persistent anemia I do not think that this is an option. -- He is well rate controlled on Amio and Toprol.   Symptomatic Anemia: Transfused 1 unit PRBCs with 40 mg IV Lasix,. -- etiology uncertain -- be sure to diurese if given further  blood transfusions.  Hypokalemia -- repleting this am  CAD (CABG  In 1988, stent 2011, cutting balloon angioplasty 2012: Denies having had any chest pains.  For questions or updates, please contact [place] Please consult [email or web address] for contact info under Cardiology/STEMI.      Signed, Fransico Him, MD  03/18/2017, 10:33 AM

## 2017-03-18 NOTE — Consult Note (Signed)
Cardiology Consultation:   Patient ID: [name]; [id number]; 1927/11/02   Admit date: 03/17/2017 Date of Consult: 03/18/2017  Primary Care Provider: Christain Sacramento, MD Primary Cardiologist: Dr. Sallyanne Kuster   Patient Profile:   Dakota Liu is a 83 y.o. male with a hx of ICM, Chronic CHF (mixed), CAD/(CABG in 1988, stent 2011, cutting balloon angioplasty 2012), PAD, AFib, VT, CRT-P, hx of recurrent anemia when on a/c, HTN, and HLD,  who is being seen today for the evaluation of AF management at the request of Dr.  Radford Pax.  History of Present Illness:   Dakota Liu was admitted to [facility] yesterday with worsening SOB and weight gain despite recent up-titration of his home lasix found with CHF, as well as his known anemia, he is being diuresed with IV lasix was given PRBC/lasix as well.    He underwent re-initiation of his a/c for the purpose of DCCV which he had done on 01/12/17, was maintained on his a/c for 4 weeks and stopped given his hx of recurrent anemia and need for transfusion.  He did in-fact get 1uPRBC on 02/25/17 for Hgb of 7.1 out patient.  Loss of SR is felt to be playing a role in his HF exacerbations, and EP is being asked to weigh in.  The patient tells me he came because he was having breathing  Trouble, no CP, no reports of falls or syncope.  He feels this afternoon like his breathing is improved.  Device information: MDT CRT-P, implanted 06/25/10, gen change 10/09/15, Dr. Sallyanne Kuster LV lead thresholds are known to be high  Past Medical History:  Diagnosis Date  . Anemia   . Anemia, iron deficiency 11/21/2011  . CAD (coronary artery disease) '88, 12/11, 3/12   CABG'88, PCI 12/1, 3/12  . CHF (congestive heart failure) ([place])   . Dyslipidemia   . Epididymitis   . HTN (hypertension)   . Ischemic cardiomyopathy 10/14   EF improved to 40-45% after BiV ICD  . LBBB (left bundle branch block)   . Lymphoma ([place])    with metal radiation about 2006  . PAF (paroxysmal atrial  fibrillation) ([place]) 12/11  . PVD (peripheral vascular disease) ([place]) 11/10   AAA R&G  . VT (ventricular tachycardia) ([place]) 12/11    Past Surgical History:  Procedure Laterality Date  . ABDOMINAL AORTIC ANEURYSM REPAIR  11/10  . CARDIAC DEFIBRILLATOR PLACEMENT  12/11  . CARDIOVERSION N/A 05/20/2016   Procedure: CARDIOVERSION;  Surgeon: Sanda Klein, MD;  Location: [place] [facility];  Service: Cardiovascular;  Laterality: N/A;  . CARDIOVERSION N/A 01/12/2017   Procedure: CARDIOVERSION;  Surgeon: Sanda Klein, MD;  Location: [place];  Service: Cardiovascular;  Laterality: N/A;  . CORONARY ANGIOPLASTY WITH STENT PLACEMENT  07/05/10, 3/12   RCA with ISR 3/12  . CORONARY ARTERY BYPASS GRAFT  1988  . EP IMPLANTABLE DEVICE N/A 10/09/2015   Procedure: ICD to [place];  Surgeon: Sanda Klein, MD;  Location: [place] [facility] LAB;  Service: Cardiovascular;  Laterality: N/A;  . LEFT HEART CATHETERIZATION WITH CORONARY/GRAFT ANGIOGRAM N/A 05/03/2013   Procedure: LEFT HEART CATHETERIZATION WITH Beatrix Fetters;  Surgeon: Peter M Martinique, MD;  Location[facility] CATH LAB;  Service: Cardiovascular;  Laterality: N/A;  . ORCHIECTOMY    . TEE WITHOUT CARDIOVERSION N/A 05/20/2016   Procedure: TRANSESOPHAGEAL ECHOCARDIOGRAM (TEE);  Surgeon: Sanda Klein, MD;  Location: [facility] ENDOSCOPY;  Service: Cardiovascular;  Laterality: N/A;  . TEE WITHOUT CARDIOVERSION N/A 01/12/2017   Procedure: TRANSESOPHAGEAL ECHOCARDIOGRAM (TEE);  Surgeon: Sanda Klein, MD;  Location: [facility] ENDOSCOPY;  Service: Cardiovascular;  Laterality: N/A;  . TONSILLECTOMY         Inpatient Medications: Scheduled Meds: . amiodarone  200 mg Oral BID  . enoxaparin (LOVENOX) injection  40 mg Subcutaneous Daily  . furosemide  80 mg Intravenous BID  . iron polysaccharides  150 mg Oral BID  . metoprolol succinate  50 mg Oral Daily  . pantoprazole  40 mg Oral BID  . potassium chloride  40 mEq Oral BID  . pravastatin  40 mg  Oral Daily  . ranolazine  500 mg Oral BID  . senna-docusate  1 tablet Oral BID  . sodium chloride flush  3 mL Intravenous Q12H   Continuous Infusions: . sodium chloride    . sodium chloride     PRN Meds: sodium chloride, acetaminophen, diphenhydrAMINE, ondansetron (ZOFRAN) IV, sodium chloride flush  Allergies:    Allergies  Allergen Reactions  . Tramadol Nausea And Vomiting and Other (See Comments)    Dry heaving, GI upset  . Ace Inhibitors Cough  . Codeine Nausea And Vomiting  . Simvastatin Other (See Comments)    Myalgia- high doses (takes 20mg  at home without issue)    Social History:   Social History   Social History  . Marital status: Married    Spouse name: N/A  . Number of children: N/A  . Years of education: N/A   Occupational History  . Not on file.   Social History Main Topics  . Smoking status: Former Smoker    Packs/day: 1.00    Years: 44.00    Types: Cigarettes    Quit date: 07/07/1984  . Smokeless tobacco: Never Used  . Alcohol use No  . Drug use: No  . Sexual activity: Not on file   Other Topics Concern  . Not on file   Social History Narrative  . No narrative on file    Family History:   Family History  Problem Relation Age of Onset  . Diabetes Mother   . Heart Problems Mother   . Stroke Father   . Cancer Sister      ROS:  Please see the history of present illness.  ROS  All other ROS reviewed and negative.     Physical Exam/Data:   Vitals:   03/17/17 1930 03/17/17 2015 03/17/17 2144 03/18/17 0858  BP: 118/70 132/87 137/76 (!) 127/109  Pulse: 68 65 78 74  Resp: (!) 24 19 20 18   Temp:   98.9 F (37.2 C) 98.7 F (37.1 C)  TempSrc:   Oral Oral  SpO2: 93% 95% 98% 96%  Weight:   173 lb 4.5 oz (78.6 kg)   Height:        Intake/Output Summary (Last 24 hours) at 03/18/17 1335 Last data filed at 03/18/17 1100  Gross per 24 hour  Intake              435 ml  Output             1200 ml  Net             -765 ml   Filed Weights     03/17/17 0639 03/17/17 2144  Weight: 173 lb (78.5 kg) 173 lb 4.5 oz (78.6 kg)   Body mass index is 23.5 kg/m.  General:  Well nourished, well developed, in no acute distress HEENT: normal Lymph: no adenopathy Neck: sitting in th chair, not able to assess Endocrine:  No thryomegaly  Vascular: No carotid bruits Cardiac:  RRR; 1/6 SM, no gallops or rubs Lungs:  Diminished at the bases slightly, no wheezing, rhonchi or rales  Abd: soft, nontender, no hepatomegaly  Ext: no edema Musculoskeletal:  No deformities, age appropriate atrophy Skin: warm and dry  Neuro:  no focal abnormalities noted.  The patient is oriented to self, place, year, president, needed to be reminded he was in the hospital, but able to reorient. Psych:  Somewhat agitated though not unpleasent   EKG:  The EKG was personally reviewed and demonstrates:   V paced Telemetry:  Telemetry was personally reviewed and demonstrates:   Telemetry ordered, though patient refused  Relevant CV Studies: 01/12/17: TEE/DCCV Study Conclusions - Left ventricle: The cavity size was normal. Wall thickness was normal. Systolic function was moderately to severely reduced. The estimated ejection fraction was in the range of 30% to 35%. Akinesis and scarring of the mid-apicalanteroseptal, anterior, and apical myocardium; consistent with infarction in the distribution of the left anterior descending coronary artery. No evidence of thrombus. - Aortic valve: No evidence of vegetation. - Mitral valve: No evidence of vegetation. There was mild regurgitation. - Left atrium: The atrium was moderately dilated. No evidence of thrombus in the atrial cavity or appendage. There was mildcontinuous spontaneous echo contrast (&quot;smoke&quot;) in the cavity. - Right atrium: No evidence of thrombus in the atrial cavity or appendage. - Atrial septum: No defect or patent foramen ovale was identified. - Tricuspid valve: No evidence  of vegetation. - Pulmonic valve: No evidence of vegetation. Impressions: - Successful cardioversion. No cardiac source of emboli was indentified.  Laboratory Data:  Chemistry Recent Labs Lab 03/17/17 0807 03/18/17 0456  NA 136 138  K 3.4* 3.1*  CL 99* 100*  CO2 29 30  GLUCOSE 131* 137*  BUN 18 17  CREATININE 1.13 1.18  CALCIUM 8.6* 8.6*  GFRNONAA 56* 54*  GFRAA >60 >60  ANIONGAP 8 8    No results for input(s): PROT, ALBUMIN, AST, ALT, ALKPHOS, BILITOT in the last 168 hours. Hematology Recent Labs Lab 03/17/17 0807 03/17/17 1939 03/18/17 0456  WBC 7.6 8.6 7.0  RBC 3.27* 3.77* 3.88*  HGB 7.7* 8.9* 9.2*  HCT 26.9* 30.7* 31.7*  MCV 82.3 81.4 81.7  MCH 23.5* 23.6* 23.7*  MCHC 28.6* 29.0* 29.0*  RDW 19.5* 18.9* 19.3*  PLT 211 213 195   Cardiac EnzymesNo results for input(s): TROPONINI in the last 168 hours.  Recent Labs Lab 03/17/17 0806  TROPIPOC 0.00    BNP Recent Labs Lab 03/17/17 0808  BNP 682.5*    DDimer No results for input(s): DDIMER in the last 168 hours.  Radiology/Studies:  Dg Chest Port 1 View Result Date: 03/17/2017 CLINICAL DATA:  Shortness of breath. EXAM: PORTABLE CHEST 1 VIEW COMPARISON:  05/22/2016 . FINDINGS: AICD in stable position. Prior CABG. Stable cardiomegaly. Low lung volumes with bibasilar atelectasis/infiltrate. Persistent left base density consistent with scarring. IMPRESSION: 1. Low lung volumes with bibasilar atelectasis/infiltrates. Persistent left base density consistent with scarring. 2.  AICD in stable position.  Prior CABG.  Stable cardiomegaly. Electronically Signed   By: Marcello Moores  Register   On: 03/17/2017 07:29    Assessment and Plan:   1. Persistent AFib     CHA2DS2Vasc is at least 5, unable to maintain a/c 2/2 recurrent anemia requiring transfusion      The patient has decided he no longer wants to even briefly try a/c for the purposes of DCCV, he has been on amiodarone and has had  recurrent AF, leaving no rhythm  control options.  His BP looks better today, and can likely tolerate up-titration of his BB to try and increase his BiVe pacing %., last week was <90%   His LV lead is known to have high pacing thresholds, at his last visit were up some, LV outputs were increased slightly to try an ensure LV capture. EKG here would suggest intermittent.  Will review CXR with Dr. Lovena Le and last device interrogation.   2. CHF     Fluid neg 751ml     Hypokalemia is being replaced  3. Delirium     Overnight and today with intermittent agitation/delirium     Primary team is aware in discussion with RN  EP Attending  Patient seen and examined. Agree with above note by Tommye Standard, PA-C. He is a delirious man with multiple medical problems admitted with anemia and acute on chronic systolic heart failure. His exam is documented as above. The patient appears to be nearing the end of his illness. I do not think he is a candidate for any additional therapy for his BiV device. He appears to have failed amiodarone. AV node ablation and a his bundle lead would be an option but he is completely against considering this option. Finally, he has some delirium and confusion which will need to be addressed.   Dakota Liu,M.D.        For questions or updates, please contact [place] Please consult [email or web address] for contact info under Cardiology/STEMI.   Signed, Baldwin Jamaica, PA-C  03/18/2017 1:35 PM
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
## 2017-03-18 NOTE — Progress Notes (Signed)
PT Cancellation Note  Patient Details Name: Dakota Liu MRN: [id number] DOB: [birth year]   Cancelled Treatment:    Reason Eval/Treat Not Completed: Other (comment).  Pt is adamantly refusing therapy and wife is present while this is occurring.  He reports he is fine but doesn't walk, uses WC.  Wife expresses concern about his safety but will not try to talk with pt to do the PT evaluation.  Check tomorrow and if pt refuses again may be appropriate for DC of the order.   Ramond Dial 03/18/2017, 3:36 PM   Mee Hives, PT MS Acute Rehab Dept. Number: [place] and [place]

## 2017-03-18 NOTE — Telephone Encounter (Signed)
Attempted to return call to wife - line busy x3
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
## 2017-03-18 NOTE — Progress Notes (Signed)
Pt had been taken up to desk to sit with staff and eat breakfast (pt still yelling out and getting out of chair). Pt calm at desk, ate breakfast. Wife came in and pt taken back to room.

## 2017-03-18 NOTE — Progress Notes (Signed)
  Spoke with patient regarding his test.  He does not want to go to CT. "No,  I am not! '  Patient was getting agitated when told again.

## 2017-03-19 DIAGNOSIS — R0603 Acute respiratory distress: Secondary | ICD-10-CM

## 2017-03-19 DIAGNOSIS — R0602 Shortness of breath: Secondary | ICD-10-CM

## 2017-03-19 DIAGNOSIS — I481 Persistent atrial fibrillation: Secondary | ICD-10-CM

## 2017-03-19 DIAGNOSIS — E876 Hypokalemia: Secondary | ICD-10-CM

## 2017-03-19 DIAGNOSIS — R531 Weakness: Secondary | ICD-10-CM

## 2017-03-19 DIAGNOSIS — I739 Peripheral vascular disease, unspecified: Secondary | ICD-10-CM

## 2017-03-19 DIAGNOSIS — I5023 Acute on chronic systolic (congestive) heart failure: Secondary | ICD-10-CM

## 2017-03-19 DIAGNOSIS — Z515 Encounter for palliative care: Secondary | ICD-10-CM

## 2017-03-19 DIAGNOSIS — Z87891 Personal history of nicotine dependence: Secondary | ICD-10-CM

## 2017-03-19 DIAGNOSIS — R410 Disorientation, unspecified: Secondary | ICD-10-CM

## 2017-03-19 DIAGNOSIS — Z66 Do not resuscitate: Secondary | ICD-10-CM

## 2017-03-19 DIAGNOSIS — I509 Heart failure, unspecified: Secondary | ICD-10-CM

## 2017-03-19 DIAGNOSIS — D500 Iron deficiency anemia secondary to blood loss (chronic): Secondary | ICD-10-CM

## 2017-03-19 DIAGNOSIS — Z950 Presence of cardiac pacemaker: Secondary | ICD-10-CM

## 2017-03-19 DIAGNOSIS — I2510 Atherosclerotic heart disease of native coronary artery without angina pectoris: Secondary | ICD-10-CM

## 2017-03-19 DIAGNOSIS — R451 Restlessness and agitation: Secondary | ICD-10-CM

## 2017-03-19 LAB — COMPREHENSIVE METABOLIC PANEL
ALK PHOS: 66 U/L (ref 38–126)
ALT: 26 U/L (ref 17–63)
AST: 26 U/L (ref 15–41)
Albumin: 3.8 g/dL (ref 3.5–5.0)
Anion gap: 9 (ref 5–15)
BILIRUBIN TOTAL: 1.7 mg/dL — AB (ref 0.3–1.2)
BUN: 18 mg/dL (ref 6–20)
CALCIUM: 8.9 mg/dL (ref 8.9–10.3)
CO2: 32 mmol/L (ref 22–32)
Chloride: 98 mmol/L — ABNORMAL LOW (ref 101–111)
Creatinine, Ser: 1.22 mg/dL (ref 0.61–1.24)
GFR, EST AFRICAN AMERICAN: 60 mL/min — AB (ref >60)
GFR, EST NON AFRICAN AMERICAN: 51 mL/min — AB (ref >60)
Glucose, Bld: 113 mg/dL — ABNORMAL HIGH (ref 65–99)
Potassium: 3.2 mmol/L — ABNORMAL LOW (ref 3.5–5.1)
Sodium: 139 mmol/L (ref 135–145)
TOTAL PROTEIN: 6.5 g/dL (ref 6.5–8.1)

## 2017-03-19 LAB — URINE CULTURE

## 2017-03-19 LAB — CBC WITH DIFFERENTIAL/PLATELET
BASOS ABS: 0 10*3/uL (ref 0.0–0.1)
Basophils Relative: 0 %
EOS ABS: 0 10*3/uL (ref 0.0–0.7)
EOS PCT: 0 %
HCT: 33.8 % — ABNORMAL LOW (ref 39.0–52.0)
HEMOGLOBIN: 9.8 g/dL — AB (ref 13.0–17.0)
LYMPHS PCT: 10 %
Lymphs Abs: 0.9 10*3/uL (ref 0.7–4.0)
MCH: 24 pg — AB (ref 26.0–34.0)
MCHC: 29 g/dL — AB (ref 30.0–36.0)
MCV: 82.6 fL (ref 78.0–100.0)
MONO ABS: 1.5 10*3/uL — AB (ref 0.1–1.0)
Monocytes Relative: 16 %
Neutro Abs: 6.9 10*3/uL (ref 1.7–7.7)
Neutrophils Relative %: 74 %
Platelets: 244 10*3/uL (ref 150–400)
RBC: 4.09 MIL/uL — AB (ref 4.22–5.81)
RDW: 19.3 % — ABNORMAL HIGH (ref 11.5–15.5)
WBC: 9.4 10*3/uL (ref 4.0–10.5)

## 2017-03-19 LAB — MAGNESIUM: MAGNESIUM: 1.9 mg/dL (ref 1.7–2.4)

## 2017-03-19 LAB — PHOSPHORUS: PHOSPHORUS: 2.6 mg/dL (ref 2.5–4.6)

## 2017-03-19 MED ORDER — LORAZEPAM 2 MG/ML IJ SOLN
INTRAMUSCULAR | Status: AC
  Administered 2017-03-19: 2 mg via INTRAVENOUS
  Filled 2017-03-19: qty 1

## 2017-03-19 MED ORDER — METOPROLOL SUCCINATE ER 100 MG PO TB24
100.0000 mg | ORAL_TABLET | Freq: Every day | ORAL | Status: DC
  Administered 2017-03-19 – 2017-03-21 (×3): 100 mg via ORAL
  Filled 2017-03-19 (×3): qty 1

## 2017-03-19 MED ORDER — HYDROXYZINE HCL 25 MG PO TABS
25.0000 mg | ORAL_TABLET | Freq: Every day | ORAL | Status: DC
  Administered 2017-03-19: 25 mg via ORAL
  Filled 2017-03-19: qty 1

## 2017-03-19 MED ORDER — DIVALPROEX SODIUM 125 MG PO CSDR
250.0000 mg | DELAYED_RELEASE_CAPSULE | Freq: Two times a day (BID) | ORAL | Status: DC
  Administered 2017-03-19 – 2017-03-21 (×4): 250 mg via ORAL
  Filled 2017-03-19 (×4): qty 2

## 2017-03-19 MED ORDER — LORAZEPAM 2 MG/ML IJ SOLN
2.0000 mg | Freq: Once | INTRAMUSCULAR | Status: AC
  Administered 2017-03-19: 2 mg via INTRAVENOUS

## 2017-03-19 MED ORDER — HYDROXYZINE HCL 25 MG PO TABS: 25.0000 mg | ORAL_TABLET | Freq: Every evening | ORAL | Status: DC | PRN

## 2017-03-19 NOTE — Consult Note (Signed)
[facility] Face-to-Face Psychiatry Consult   Reason for Consult:  Delirium and Acute respiratory distress Referring Physician:  Dr. Alfredia Ferguson Patient Identification: Dakota Liu MRN:  [id number] Principal Diagnosis: Acute respiratory distress Diagnosis:   Patient Active Problem List   Diagnosis Date Noted  . Delirium [R41.0] 03/18/2017  . Acute respiratory distress [R06.03] 03/17/2017  . Acute on chronic congestive heart failure ([place]) [I50.9]   . Drug-induced gynecomastia [id number], T50.905A] 09/17/2016  . Persistent atrial fibrillation ([place]) [I48.1] 05/07/2016  . Actinic keratitis of right eye [H16.131] 04/01/2016  . Hypokalemia [E87.6] 03/05/2016  . Biventricular cardiac pacemaker in situ [Z95.0] 01/24/2016  . Anemia, chronic disease [D63.8] 08/21/2015  . AAA (abdominal aortic aneurysm) ([place]) [I71.4] 08/20/2015  . Encounter for long-term (current) use of high-risk medication [Z79.899] 08/20/2015  . Foot drop [M21.379] 08/20/2015  . GERD (gastroesophageal reflux disease) [K21.9] 08/20/2015  . Hyperlipidemia [E78.5] 08/20/2015  . Hypertension [I10] 08/20/2015  . Malaise and fatigue [R53.81, R53.83] 08/20/2015  . 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 Peripheral neuropathy [G62.9] 08/20/2015  . Paraplegia ([place]) [G82.20] 08/20/2015  . UTI (urinary tract infection) [N39.0] 05/31/2013  . ASCVD (arteriosclerotic cardiovascular disease) [I25.10] 05/03/2013  . LBBB (left bundle branch block) [I44.7] 05/03/2013  . PVD (peripheral vascular disease)- AAA R&G Nov 2010 [I73.9] 05/03/2013  . Dyslipidemia [E78.5] 05/03/2013  . VT (ventricular tachycardia) - 12/11 [I47.2] 05/03/2013  . Generalized weakness [R53.1] 01/17/2013  . Dark urine [R82.99] 01/17/2013  . Effects of radiation [T66.XXXA] 06/07/2012  . Follicular lymphoma of head, face, and neck-'06 [C82.11] 11/21/2011  . Anemia, iron deficiency [D50.9] 11/21/2011  . Ischemic cardiomyopathy- EF improved  to 40-45% 10/14 [I25.5] 10/10/2010  . Acute on chronic systolic (congestive) heart failure (HCC) [I50.23] 10/10/2010    Total Time spent with patient: 1 hour  Subjective:   Dakota Liu is a 83 y.o. male patient admitted with SOB and acute delirium.  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 fib, hypertension, dyslipidemia, admitted to the hospital with worsening shortness of breath.  Psychiatric consultation requested for patient aggressive behaviors towards his wife including punching with effaced and having a paranoid delusion that his wife is going after men and he saw her in parking.with some man which patient wife refuses. Patient stated that I have a bad temper and anger outbursts when I see my life is cheating on me. Patient has no history of acute psychiatric hospitalizations or outpatient psychiatric treatment in the past. Will ask primary team to evaluate for neuropathology at this time. Patient has no suicidal or homicidal ideation, intention or plans. Reportedly patient has been in and out of the decubitus state but during my evaluation patient has intact cognitions without having any difficulties.  Past Psychiatric History: None reported.  Risk to Self: Is patient at risk for suicide?: No Risk to Others:   Prior Inpatient Therapy:   Prior Outpatient Therapy:    Past Medical History:  Past Medical History:  Diagnosis Date  . Anemia   . Anemia, iron deficiency 11/21/2011  . CAD (coronary artery disease) '88, 12/11, 3/12   CABG'88, PCI 12/1, 3/12  . CHF (congestive heart failure) ([place])   . Dyslipidemia   . Epididymitis   . HTN (hypertension)   . Ischemic cardiomyopathy 10/14   EF improved to 40-45% after BiV ICD  . LBBB (left bundle branch  block)   . Lymphoma ([place])    with metal radiation about 2006  . PAF (paroxysmal atrial fibrillation) ([place])  12/11  . PVD (peripheral vascular disease) ([place]) 11/10   AAA R&G  . VT (ventricular tachycardia) ([place]) 12/11    Past Surgical History:  Procedure Laterality Date  . ABDOMINAL AORTIC ANEURYSM REPAIR  11/10  . CARDIAC DEFIBRILLATOR PLACEMENT  12/11  . CARDIOVERSION N/A 05/20/2016   Procedure: CARDIOVERSION;  Surgeon: Sanda Klein, MD;  Location: [facility];  Service: Cardiovascular;  Laterality: N/A;  . CARDIOVERSION N/A 01/12/2017   Procedure: CARDIOVERSION;  Surgeon: Sanda Klein, MD;  Location: [facility];  Service: Cardiovascular;  Laterality: N/A;  . CORONARY ANGIOPLASTY WITH STENT PLACEMENT  07/05/10, 3/12   RCA with ISR 3/12  . CORONARY ARTERY BYPASS GRAFT  1988  . EP IMPLANTABLE DEVICE N/A 10/09/2015   Procedure: ICD to [place];  Surgeon: Sanda Klein, MD;  Location: [facility];  Service: Cardiovascular;  Laterality: N/A;  . LEFT HEART CATHETERIZATION WITH CORONARY/GRAFT ANGIOGRAM N/A 05/03/2013   Procedure: LEFT HEART CATHETERIZATION WITH Beatrix Fetters;  Surgeon: Peter M Martinique, MD;  Location: [facility] CATH LAB;  Service: Cardiovascular;  Laterality: N/A;  . ORCHIECTOMY    . TEE WITHOUT CARDIOVERSION N/A 05/20/2016   Procedure: TRANSESOPHAGEAL ECHOCARDIOGRAM (TEE);  Surgeon: Sanda Klein, MD;  Location: [facility] ENDOSCOPY;  Service: Cardiovascular;  Laterality: N/A;  . TEE WITHOUT CARDIOVERSION N/A 01/12/2017   Procedure: TRANSESOPHAGEAL ECHOCARDIOGRAM (TEE);  Surgeon: Sanda Klein, MD;  Location: [facility] ENDOSCOPY;  Service: Cardiovascular;  Laterality: N/A;  . TONSILLECTOMY     Family History:  Family History  Problem Relation Age of Onset  . Diabetes Mother   . Heart Problems Mother   . Stroke Father   . Cancer Sister    Family Psychiatric  History: unknown Social History:  History  Alcohol Use No     History  Drug Use No    Social History   Social History  . Marital status: Married    Spouse name: N/A  . Number of children: N/A   . Years of education: N/A   Social History Main Topics  . Smoking status: Former Smoker    Packs/day: 1.00    Years: 44.00    Types: Cigarettes    Quit date: 07/07/1984  . Smokeless tobacco: Never Used  . Alcohol use No  . Drug use: No  . Sexual activity: Not Asked   Other Topics Concern  . None   Social History Narrative  . None   Additional Social History:    Allergies:   Allergies  Allergen Reactions  . Tramadol Nausea And Vomiting and Other (See Comments)    Dry heaving, GI upset  . Ace Inhibitors Cough  . Codeine Nausea And Vomiting  . Simvastatin Other (See Comments)    Myalgia- high doses (takes 25m at home without issue)    Labs:  Results for orders placed or performed during the hospital encounter of 03/17/17 (from the past 48 hour(s))  CBC     Status: Abnormal   Collection Time: 03/17/17  7:39 PM  Result Value Ref Range   WBC 8.6 4.0 - 10.5 K/uL   RBC 3.77 (L) 4.22 - 5.81 MIL/uL   Hemoglobin 8.9 (L) 13.0 - 17.0 g/dL   HCT 30.7 (L) 39.0 - 52.0 %   MCV 81.4 78.0 - 100.0 fL   MCH 23.6 (L) 26.0 - 34.0 pg   MCHC 29.0 (L) 30.0 - 36.0 g/dL  RDW 18.9 (H) 11.5 - 15.5 %   Platelets 213 150 - 400 K/uL  Basic metabolic panel     Status: Abnormal   Collection Time: 03/18/17  4:56 AM  Result Value Ref Range   Sodium 138 135 - 145 mmol/L   Potassium 3.1 (L) 3.5 - 5.1 mmol/L   Chloride 100 (L) 101 - 111 mmol/L   CO2 30 22 - 32 mmol/L   Glucose, Bld 137 (H) 65 - 99 mg/dL   BUN 17 6 - 20 mg/dL   Creatinine, Ser 1.18 0.61 - 1.24 mg/dL   Calcium 8.6 (L) 8.9 - 10.3 mg/dL   GFR calc non Af Amer 54 (L) >60 mL/min   GFR calc Af Amer >60 >60 mL/min    Comment: (NOTE) The eGFR has been calculated using the CKD EPI equation. This calculation has not been validated in all clinical situations. eGFR's persistently <60 mL/min signify possible Chronic Kidney Disease.    Anion gap 8 5 - 15  CBC     Status: Abnormal   Collection Time: 03/18/17  4:56 AM  Result Value Ref  Range   WBC 7.0 4.0 - 10.5 K/uL   RBC 3.88 (L) 4.22 - 5.81 MIL/uL   Hemoglobin 9.2 (L) 13.0 - 17.0 g/dL   HCT 31.7 (L) 39.0 - 52.0 %   MCV 81.7 78.0 - 100.0 fL   MCH 23.7 (L) 26.0 - 34.0 pg   MCHC 29.0 (L) 30.0 - 36.0 g/dL   RDW 19.3 (H) 11.5 - 15.5 %   Platelets 195 150 - 400 K/uL  Glucose, capillary     Status: Abnormal   Collection Time: 03/18/17  7:36 AM  Result Value Ref Range   Glucose-Capillary 140 (H) 65 - 99 mg/dL  Urinalysis, Routine w reflex microscopic     Status: None   Collection Time: 03/18/17  2:54 PM  Result Value Ref Range   Color, Urine YELLOW YELLOW   APPearance CLEAR CLEAR   Specific Gravity, Urine 1.009 1.005 - 1.030   pH 7.0 5.0 - 8.0   Glucose, UA NEGATIVE NEGATIVE mg/dL   Hgb urine dipstick NEGATIVE NEGATIVE   Bilirubin Urine NEGATIVE NEGATIVE   Ketones, ur NEGATIVE NEGATIVE mg/dL   Protein, ur NEGATIVE NEGATIVE mg/dL   Nitrite NEGATIVE NEGATIVE   Leukocytes, UA NEGATIVE NEGATIVE  CBC with Differential/Platelet     Status: Abnormal   Collection Time: 03/19/17  3:19 AM  Result Value Ref Range   WBC 9.4 4.0 - 10.5 K/uL   RBC 4.09 (L) 4.22 - 5.81 MIL/uL   Hemoglobin 9.8 (L) 13.0 - 17.0 g/dL   HCT 33.8 (L) 39.0 - 52.0 %   MCV 82.6 78.0 - 100.0 fL   MCH 24.0 (L) 26.0 - 34.0 pg   MCHC 29.0 (L) 30.0 - 36.0 g/dL   RDW 19.3 (H) 11.5 - 15.5 %   Platelets 244 150 - 400 K/uL   Neutrophils Relative % 74 %   Neutro Abs 6.9 1.7 - 7.7 K/uL   Lymphocytes Relative 10 %   Lymphs Abs 0.9 0.7 - 4.0 K/uL   Monocytes Relative 16 %   Monocytes Absolute 1.5 (H) 0.1 - 1.0 K/uL   Eosinophils Relative 0 %   Eosinophils Absolute 0.0 0.0 - 0.7 K/uL   Basophils Relative 0 %   Basophils Absolute 0.0 0.0 - 0.1 K/uL  Comprehensive metabolic panel     Status: Abnormal   Collection Time: 03/19/17  3:19 AM  Result Value Ref Range  Sodium 139 135 - 145 mmol/L   Potassium 3.2 (L) 3.5 - 5.1 mmol/L   Chloride 98 (L) 101 - 111 mmol/L   CO2 32 22 - 32 mmol/L   Glucose, Bld 113  (H) 65 - 99 mg/dL   BUN 18 6 - 20 mg/dL   Creatinine, Ser 1.22 0.61 - 1.24 mg/dL   Calcium 8.9 8.9 - 10.3 mg/dL   Total Protein 6.5 6.5 - 8.1 g/dL   Albumin 3.8 3.5 - 5.0 g/dL   AST 26 15 - 41 U/L   ALT 26 17 - 63 U/L   Alkaline Phosphatase 66 38 - 126 U/L   Total Bilirubin 1.7 (H) 0.3 - 1.2 mg/dL   GFR calc non Af Amer 51 (L) >60 mL/min   GFR calc Af Amer 60 (L) >60 mL/min    Comment: (NOTE) The eGFR has been calculated using the CKD EPI equation. This calculation has not been validated in all clinical situations. eGFR's persistently <60 mL/min signify possible Chronic Kidney Disease.    Anion gap 9 5 - 15  Magnesium     Status: None   Collection Time: 03/19/17  3:19 AM  Result Value Ref Range   Magnesium 1.9 1.7 - 2.4 mg/dL  Phosphorus     Status: None   Collection Time: 03/19/17  3:19 AM  Result Value Ref Range   Phosphorus 2.6 2.5 - 4.6 mg/dL    Current Facility-Administered Medications  Medication Dose Route Frequency Provider Last Rate Last Dose  . 0.9 %  sodium chloride infusion  250 mL Intravenous PRN Black, Karen M, NP      . 0.9 %  sodium chloride infusion   Intravenous Once Black, Lezlie Octave, NP      . acetaminophen (TYLENOL) tablet 650 mg  650 mg Oral Q4H PRN Radene Gunning, NP      . amiodarone (PACERONE) tablet 200 mg  200 mg Oral BID Dyanne Carrel M, NP   200 mg at 03/18/17 2209  . enoxaparin (LOVENOX) injection 40 mg  40 mg Subcutaneous Daily Radene Gunning, NP   40 mg at 03/18/17 1105  . furosemide (LASIX) injection 80 mg  80 mg Intravenous BID Sueanne Margarita, MD   80 mg at 03/19/17 0827  . iron polysaccharides (NIFEREX) capsule 150 mg  150 mg Oral BID Radene Gunning, NP   150 mg at 03/18/17 2209  . metoprolol succinate (TOPROL-XL) 24 hr tablet 50 mg  50 mg Oral Daily Radene Gunning, NP   50 mg at 03/18/17 1105  . ondansetron (ZOFRAN) injection 4 mg  4 mg Intravenous Q6H PRN Black, Karen M, NP      . pantoprazole (PROTONIX) EC tablet 40 mg  40 mg Oral BID Radene Gunning, NP   40 mg at 03/18/17 2209  . potassium chloride SA (K-DUR,KLOR-CON) CR tablet 40 mEq  40 mEq Oral BID Raiford Noble [place], DO   40 mEq at 03/18/17 2209  . pravastatin (PRAVACHOL) tablet 40 mg  40 mg Oral Daily Radene Gunning, NP   40 mg at 03/18/17 1104  . ranolazine (RANEXA) 12 hr tablet 500 mg  500 mg Oral BID Radene Gunning, NP   500 mg at 03/18/17 2208  . sodium chloride flush (NS) 0.9 % injection 3 mL  3 mL Intravenous Q12H Radene Gunning, NP   3 mL at 03/18/17 2212  . sodium chloride flush (NS) 0.9 % injection 3 mL  3 mL  Intravenous PRN Black, Lezlie Octave, NP        Musculoskeletal: Strength & Muscle Tone: decreased Gait & Station: unable to stand, he is known paraplegic. Patient leans: N/A  Psychiatric Specialty Exam: Physical Exam as per history and physical   ROS complaining about shortness of breath and elevated off his medical problems including generalized weakness cardiac pacemaker, atrial fibrillation etc. Patient continued to endorse paranoid delusion during my evaluation. Patient is calm cooperative and pleasant without any irritability, estrogen aggressive behavior.  No Fever-chills, No Headache, No changes with Vision or hearing, reports vertigo No problems swallowing food or Liquids, No Chest pain, Cough or Shortness of Breath, No Abdominal pain, No Nausea or Vommitting, Bowel movements are regular, No Blood in stool or Urine, No dysuria, No new skin rashes or bruises, No new joints pains-aches,  No new weakness, tingling, numbness in any extremity, No recent weight gain or loss, No polyuria, polydypsia or polyphagia,  A full 10 point Review of Systems was done, except as stated above, all other Review of Systems were negative.  Blood pressure 120/66, pulse 84, temperature 97.7 F (36.5 C), temperature source Oral, resp. rate 18, height 6' (1.829 m), weight 78.5 kg (173 lb), SpO2 94 %.Body mass index is 23.46 kg/m.  General Appearance: Casual  Eye Contact:   Good  Speech:  Clear and Coherent  Volume:  Normal  Mood:  Irritable  Affect:  Appropriate and Congruent  Thought Process:  Coherent and Goal Directed  Orientation:  Full (Time, Place, and Person)  Thought Content:  Delusions and Paranoid Ideation  Suicidal Thoughts:  No  Homicidal Thoughts:  No  Memory:  Immediate;   Fair Recent;   Fair Remote;   Fair  Judgement:  Impaired  Insight:  Shallow  Psychomotor Activity:  Normal  Concentration:  Concentration: non compliant and Attention Span: non compliant for testing  Recall:  refused for testing  Fund of Knowledge:  Fair  Language:  Good  Akathisia:  Negative  Handed:  Right  AIMS (if indicated):     Assets:  Communication Skills Desire for Improvement Financial Resources/Insurance Housing Leisure Time Resilience Social Support Transportation  ADL's:  Intact  Cognition:  WNL  Sleep:        Treatment Plan Summary: 83 years old male with multiple medical problems admitted with the shortness of breath and also has paranoid delusions recent agitation and aggressive behavior towards his wife for delusion of cheating on him.  Delirium secondary to medical condition.  Recommendation: Patient does not meet criteria for involuntary commitment Patient does not meet criteria for inpatient psychiatric hospitalization Patient has atrial fibrillation and ischemic cardiomyopathy with a QTC more than 500  so patient is not a candidate for psychotropic medications  We start hydroxyzine 25 mg at bedtime which can be repeated in 30 minutes if not sleeping May consider Depakote sprinkles 250 mg 2 of them at bedtime for controlling agitation and aggressive behaviors needed Daily contact with patient to assess and evaluate symptoms and progress in treatment and Medication management  Appreciate psychiatric consultation and we sign off as of today Please contact [phone] or [phone] if needs further assistance   Disposition: Patient does  not meet criteria for psychiatric inpatient admission. Supportive therapy provided about ongoing stressors.  Ambrose Finland, MD 03/19/2017 10:13 AM

## 2017-03-19 NOTE — Progress Notes (Signed)
Patient stayed at the nurses station up and awake at this time.

## 2017-03-19 NOTE — Evaluation (Signed)
Physical Therapy Evaluation Patient Details Name: Dakota Liu MRN: [id number] DOB: Jun 29, 1928 Today's Date: 03/19/2017   History of Present Illness  83 y.o. male admitted with acute respiraotry distress likely from acute on chronic CHF and anemia. PMhx: CAD, CABG, CHF, HTN, anemia, PAF, LBBB, PVD, lymphoma  Clinical Impression  Pt pleasant sitting at nursing station in recliner on arrival, confused. 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 Pt able to follow command for scooting to edge of chair and initiating standing but required 2 person mod assist to complete transfer. Pt with decreased cognition, balance, transfers, safety and function who will benefit from acute therapy to maximize mobility, safety and strength to decrease burden of care and fall risk.     Follow Up Recommendations SNF;Supervision/Assistance - 24 hour    Equipment Recommendations  None recommended by PT    Recommendations for Other Services       Precautions / Restrictions Precautions Precautions: Fall      Mobility  Bed Mobility               General bed mobility comments: pt in recliner at desk on arrival  Transfers Overall transfer level: Needs assistance   Transfers: Sit to/from Stand Sit to Stand: Mod assist;+2 physical assistance         General transfer comment: sequential cues for setup and assist to rise with Right knee and foot blocked pt able to push up from chair and clear buttocks but would not release armrest on left to achieve full standing x 2 trials with 1 person assist. With 2 person assist pt able to release arm rest and achieve standing with RW with maintained flexed hips, posterior lean and pushing feet forward. Mod assist for balance with assist to control descent grossly 20 seconds for standing with pt denying further activity  Ambulation/Gait             General Gait Details:  unable  Stairs            Wheelchair Mobility    Modified Rankin (Stroke Patients Only)       Balance                                             Pertinent Vitals/Pain Pain Assessment: No/denies pain    Home Living Family/patient expects to be discharged to:: Skilled nursing facility Living Arrangements: Spouse/significant other Available Help at Discharge: Family Type of Home: House Home Access: Ramped entrance     Home Layout: One level Home Equipment: Environmental consultant - 2 wheels;Cane - single point;Wheelchair - manual;Shower seat;Bedside commode Additional Comments: per chart review    Prior Function           Comments: per chart pt walked short distances with Rw and WC for longer. pt unable to state, no family present and wife did not answer phone      Hand Dominance        Extremity/Trunk Assessment   Upper Extremity Assessment Upper Extremity Assessment: Generalized weakness;Difficult to assess due to impaired cognition    Lower Extremity Assessment Lower Extremity Assessment: Generalized weakness;Difficult to assess due to impaired cognition    Cervical / Trunk Assessment Cervical / Trunk Assessment: Kyphotic  Communication   Communication: No difficulties  Cognition Arousal/Alertness: Awake/alert Behavior  During Therapy: WFL for tasks assessed/performed Overall Cognitive Status: No family/caregiver present to determine baseline cognitive functioning                                 General Comments: pt disoriented to place, time and situation stating 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 and hallucinating that there is a dog under the desk      General Comments      Exercises     Assessment/Plan    PT Assessment Patient needs continued PT services  PT Problem List Decreased strength;Decreased mobility;Decreased safety awareness;Decreased activity tolerance;Decreased balance;Decreased knowledge of use of DME;Decreased  cognition       PT Treatment Interventions      PT Goals (Current goals can be found in the Care Plan section)  Acute Rehab PT Goals Patient Stated Goal: pt unable and no family present PT Goal Formulation: Patient unable to participate in goal setting Time For Goal Achievement: 04/02/17 Potential to Achieve Goals: Fair    Frequency Min 2X/week   Barriers to discharge Decreased caregiver support      Co-evaluation               AM-PAC PT "6 Clicks" Daily Activity  Outcome Measure Difficulty turning over in bed (including adjusting bedclothes, sheets and blankets)?: A Little Difficulty moving from lying on back to sitting on the side of the bed? : A Little Difficulty sitting down on and standing up from a chair with arms (e.g., wheelchair, bedside commode, etc,.)?: Unable Help needed moving to and from a bed to chair (including a wheelchair)?: Total Help needed walking in hospital room?: Total Help needed climbing 3-5 steps with a railing? : Total 6 Click Score: 10    End of Session Equipment Utilized During Treatment: Gait belt Activity Tolerance: Patient tolerated treatment well Patient left: in chair;with chair alarm set;Other (comment) (at nursing station with staff) Nurse Communication: Mobility status PT Visit Diagnosis: Other abnormalities of gait and mobility (R26.89);Difficulty in walking, not elsewhere classified (R26.2);Unsteadiness on feet (R26.81);Muscle weakness (generalized) (M62.81)    Time: 5597-4163 PT Time Calculation (min) (ACUTE ONLY): 10 min   Charges:   PT Evaluation $PT Eval Moderate Complexity: 1 Mod     PT G Codes:        Elwyn Reach, PT [phone]   Jackson B Devan Danzer 03/19/2017, 9:11 AM
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
## 2017-03-19 NOTE — Evaluation (Addendum)
Occupational Therapy Evaluation Patient Details Name: Dakota Liu MRN: [id number] DOB: 07/10/1927 Today's Date: 03/19/2017    History of Present Illness 83 y.o. male admitted with acute respiraotry distress likely from acute on chronic CHF and anemia. PMhx: CAD, CABG, CHF, HTN, anemia, PAF, LBBB, PVD, lymphoma   Clinical Impression   Pt admitted with the above diagnoses and presents with below problem list. Pt will benefit from continued OT to address the below listed deficits and maximize independence with BADLs. Per chart PTA pt walked short distances with Rw and WC for longer. pt unable to state, no family present. Pt with impaired cognition, generalized weakness, and impaired balance impacting current levels of assist with ADLs. Pt +2 mod-max A for LB ADLs and functional transfers. Fatigues quickly. 2x sudden, uncontrolled descent returning to recliner.      Follow Up Recommendations  SNF    Equipment Recommendations  Other (comment) (defer to next venue)    Recommendations for Other Services       Precautions / Restrictions Precautions Precautions: Fall      Mobility Bed Mobility               General bed mobility comments: pt in recliner  Transfers Overall transfer level: Needs assistance Equipment used: Rolling walker (2 wheeled) Transfers: Sit to/from Stand Sit to Stand: Mod assist;+2 physical assistance         General transfer comment: Stood 2x from recliner with seated rest break between trials. Muscle weakness noted. Fatigued easily and quickly. Cues for sequencing. Verbalized fear of falling.     Balance Overall balance assessment: Needs assistance         Standing balance support: Bilateral upper extremity supported;During functional activity Standing balance-Leahy Scale: Poor Standing balance comment: able to stand briefly with +1 mod A. Fatigues quickly.                            ADL either performed or assessed with clinical  judgement   ADL Overall ADL's : Needs assistance/impaired Eating/Feeding: Set up;Sitting   Grooming: Maximal assistance;Sitting   Upper Body Bathing: Maximal assistance;Sitting   Lower Body Bathing: Maximal assistance;+2 for physical assistance;Sit to/from stand   Upper Body Dressing : Maximal assistance;Sitting   Lower Body Dressing: Maximal assistance;+2 for physical assistance;Sit to/from stand                 General ADL Comments: Pt stood at recliner with +2 assist for pericare. Stood second time from recliner for placement of pad. Able to briefly stand with mod A though noted to sit suddenly and with uncontrolled descent into recliner both times. High fall risk.      Vision         Perception     Praxis      Pertinent Vitals/Pain Pain Assessment: No/denies pain     Hand Dominance     Extremity/Trunk Assessment Upper Extremity Assessment Upper Extremity Assessment: Generalized weakness   Lower Extremity Assessment Lower Extremity Assessment: Defer to PT evaluation   Cervical / Trunk Assessment Cervical / Trunk Assessment: Kyphotic   Communication Communication Communication: No difficulties   Cognition Arousal/Alertness: Awake/alert Behavior During Therapy: WFL for tasks assessed/performed;Flat affect Overall Cognitive Status: No family/caregiver present to determine baseline cognitive functioning  General Comments: disoriented to place, time, and situation. Tangental responses.    General Comments     Exercises     Shoulder Instructions      Home Living Family/patient expects to be discharged to:: Skilled nursing facility Living Arrangements: Spouse/significant other Available Help at Discharge: Family Type of Home: House Home Access: Ramped entrance     Home Layout: One level               Home Equipment: Environmental consultant - 2 wheels;Cane - single point;Wheelchair - manual;Shower seat;Bedside  commode   Additional Comments: per chart review      Prior Functioning/Environment          Comments: per chart pt walked short distances with Rw and WC for longer. pt unable to state, no family present and wife did not answer phone         OT Problem List: Decreased strength;Decreased activity tolerance;Impaired balance (sitting and/or standing);Decreased cognition;Decreased safety awareness;Decreased knowledge of use of DME or AE;Decreased knowledge of precautions      OT Treatment/Interventions: Self-care/ADL training;Energy conservation;DME and/or AE instruction;Therapeutic activities;Cognitive remediation/compensation;Patient/family education;Balance training    OT Goals(Current goals can be found in the care plan section) Acute Rehab OT Goals Patient Stated Goal: pt unable and no family present OT Goal Formulation: Patient unable to participate in goal setting Time For Goal Achievement: 04/02/17 Potential to Achieve Goals: Good ADL Goals Pt Will Perform Grooming: sitting;with min assist;standing Pt Will Perform Lower Body Bathing: with max assist (+1) Pt Will Perform Lower Body Dressing: with max assist (+1) Pt Will Transfer to Toilet: with min assist;with +2 assist;stand pivot transfer;bedside commode Pt Will Perform Toileting - Clothing Manipulation and hygiene: with max assist;sit to/from stand (+1) Additional ADL Goal #1: Pt will complete bed mobility at mod  A level to prepare for OOB ADLs.   OT Frequency: Min 2X/week   Barriers to D/C:            Co-evaluation              AM-PAC PT "6 Clicks" Daily Activity     Outcome Measure Help from another person eating meals?: None Help from another person taking care of personal grooming?: A Lot Help from another person toileting, which includes using toliet, bedpan, or urinal?: Total Help from another person bathing (including washing, rinsing, drying)?: Total Help from another person to put on and taking off  regular upper body clothing?: A Lot Help from another person to put on and taking off regular lower body clothing?: Total 6 Click Score: 11   End of Session Equipment Utilized During Treatment: Rolling walker Nurse Communication: Other (comment) (NT present during session. )  Activity Tolerance: Patient limited by fatigue Patient left: in chair;with call bell/phone within reach;with chair alarm set;with nursing/sitter in room;Other (comment) (NT wheeled pt back to nurses station in [place]. )  OT Visit Diagnosis: Unsteadiness on feet (R26.81);Muscle weakness (generalized) (M62.81);Other abnormalities of gait and mobility (R26.89);Other symptoms and signs involving cognitive function                Time: 0912-0922 OT Time Calculation (min): 10 min Charges:  OT General Charges $OT Visit: 1 Visit OT Evaluation $OT Eval Low Complexity: 1 Low G-Codes:      Hortencia Pilar 03/19/2017, 9:45 AM

## 2017-03-19 NOTE — Progress Notes (Signed)
PROGRESS NOTE    JUN OSMENT  IPP:[id number] DOB: August 25, 1927 DOA: 03/17/2017 PCP: Christain Sacramento, MD   Brief Narrative: Dakota Liu is a very pleasant slightly HOH 83 y.o. male with medical history significant for chronic systolic heart failure, CAD status post CABG 1988, PCI 2012, s/p pacer A. fib, hypertension, dyslipidemia, since emergency department with persistent worsening shortness of breath. Initial evaluation reveals acute respiratory distress likely related to acute on chronic systolic heart failure in the setting of worsening anemia and Atrial Fibrillation. He reports gradual worsening shortness of breath over the last several days. Associated symptoms include gradual weight gain and increased lower extremity edema. He reports he received blood transfusion last month. He reports he was evaluated for GI bleed due to low hemoglobin but states "they can't find where the bleeding scum in from". He also reports he saw his cardiologist 5 days ago they recommended Lasix 40 mg twice a day for 5 days. He denies chest pain palpitations headache dizziness syncope or near-syncope. Was admitted and Cardiology was consulted along with EP and patient currently being diuresed. Hospitalization has been complicated by Delirium. Discussed case with Neuro. Patient was made 1:1 and Psychiatry was called because patient was still agitated.   Assessment & Plan:   Principal Problem:   Acute respiratory distress Active Problems:   Ischemic cardiomyopathy- EF improved to 40-45% 10/14   Generalized weakness   ASCVD (arteriosclerotic cardiovascular disease)   PVD (peripheral vascular disease)- AAA R&G Nov 2010   Dyslipidemia   Anemia, chronic disease   Biventricular cardiac pacemaker in situ   Persistent atrial fibrillation (HCC)   GERD (gastroesophageal reflux disease)   Hyperlipidemia   Hypertension   Hypokalemia   Paraplegia (HCC)   Delirium   Acute respiratory distress likely related to acute  on chronic systolic heart failure in the setting of worsening anemia, improved  -Patient with sitting shortness of breath over the last several weeks. Chest x-ray reveals low lung volumes with bibasilar atelectasis/infiltrate stable cardiomegaly. He was provided 40 mg a Lasix in the emergency department. Chart review indicates he is evaluated by cardiology last week and medications adjusted at that time -Admitted To Telemetry  -Oxygen supplementation -Continue IV Lasix as below -Intake and output; Patient is -3.105 Liters. -Daily weights, however were not accurate -Nebulizer 2 -Cardiology consulted and appreciate Reccs  -Palliative Care Consulted   Acute on chronic systolic heart failure in the setting of Atrial Fibrillation -Recent TEE reveals EF of 30%, not overtly to severely reduced systolic function, regional wall motion abnormalities.  -BNP 682. EKG as noted above. Initial troponin negative. He was provided with 40 mg Lasix IV in the emergency department -Continue IV Lasix and Cardiology increased to 80 mg IV BID -Daily weights (inacurate)  -Intake and output; -3.105 Liters -Continue amiodarone, metoprolol -Of note chart review indicates patient has been closely followed by cardiology in several adjustments have been made to medications -Appreciate Cardiology recommendations; Cannot have DCCV and cannot be anticoagulated -Continue to Monitor  -Cardiology Recommending Palliative Care Consultation and Consideration of Hospice as per Cardiology it has been difficult to maintain Heart Failure Compensation when he is in Persistent A Fib and because he does not want any aggressive intervention including but not limited to repeat Cardioversion   Anemia of chronic disease/iron deficiency anemia.  -History of same. Hemoglobin 7.7 on admission. Chart review indicates this is down from 9.8 last month.  -Patient states he has had a GI workup outpatient -FOBT -Transfuse 1  unit of packed blood  cells -Monitor; Hb/Hct Improved to 9.8/33.8 -Continue to Monitor   A. fib status post cardioversion.  -Chadvasc score 5.  -Not on anticoagulation at this point secondary to recurrent anemia. EKG as noted above. Home meds include amiodarone and metoprolol -Continue home meds -Not Anticoagulated -EP Consulted and do not have much to offer and do not think he is a candidate for additional Therapy for his BiV device because patient does not want to be Anticoagulated for the purposes of DCCV and has failed Amiodarone  CAD.  -No chest pain. EKG as noted above. Last 2014. -Continue home meds  Hypertension.  -Fair control in the emergency department. -Continue home meds  Hypokalemia -K+ was 3.2 this Am -Replete with po KCl 40 mEQ po BID -Repeat CMP in AM  Acute Hospital Delirium in the setting of likely Neurodegenerative Dementia  -Started having Paranoid Delusions of his wife cheating on him -Delirium Precautions -R/o Infection etiology and obtained U/A which was unremarkable and Urine Cx showed Multiple Species Present and suggested re-collection, CXR not revealing for Infection; No nunchal rigidity so less likely Meningitis -Obtained Head CT and showed No acute intracranial abnormality identified. Moderate chronic microvascular ischemic changes and moderate parenchymal volume loss of the brain. Small chronic right occipital lobe infarction. -Check TSH (6.600), B12, Thiamine, Ammonia, LFTs, RPR, and ESR which were not drawn yet as patient refused  -OOB to Chair Daily -Limit Sedating Medications -Psychiatry consulted and patient made 1:1 -Patient became agitated again this Evening so was given 2 mg of Lorezepam as Antipsychotics can't be used with a prolonged qT -Psych Recommended Depakote Sprinkles 250 mg po q12h and Vistaril 25 mg po qHS so that was started -Continue to Reassure and re-direct -Palliative Care Consulted for further Evaluation   DVT prophylaxis: Enoxaparin 40 mg  sq Daily Code Status: DO NOT RESUSCITATE Family Communication: Discussed plan of Care with Wife Disposition Plan: SNF vs. Home Hospice    Consultants:   Cardiology  Electrophysiology  Discussed Case with Neurology   Psychiatry  Palliative Care Medicine    Procedures:  None  Antimicrobials:  Anti-infectives    None     Subjective: Seen and examined and was calmer this AM and pleasant but later on this afternoon was paged about him becoming severely agitated and using the telemetry as a weapon. Early this AM I Discussed with wife about case. Patient at that time felt as if he had No nausea or vomiting. Patient feels as if swelling has improved.      Objective: Vitals:   03/18/17 2136 03/19/17 0444 03/19/17 0834 03/19/17 1814  BP: 134/80 121/71 120/66 124/68  Pulse: 70 100 84 88  Resp: '18 18 18 18  ' Temp: 98.7 F (37.1 C) 97.7 F (36.5 C)  98 F (36.7 C)  TempSrc: Oral Oral  Oral  SpO2: 96% 90% 94% 96%  Weight: 78.5 kg (173 lb)     Height:        Intake/Output Summary (Last 24 hours) at 03/19/17 1907 Last data filed at 03/19/17 1832  Gross per 24 hour  Intake              420 ml  Output             2600 ml  Net            -2180 ml   Filed Weights   03/17/17 0639 03/17/17 2144 03/18/17 2136  Weight: 78.5 kg (173 lb) 78.6 kg (173  lb 4.5 oz) 78.5 kg (173 lb)   Examination: Physical Exam:  Constitutional: Elderly Caucasian male who is calmer today but confused Eyes:  Sclerae anicteric. Lids normal ENMT: Grossly normal hearing. Mucous Membranes appear moist  Neck: Supple with some JVD Respiratory: Diminished but no appreciable wheezing/rales/rhonchi. Patient was not tachypenic or using any accessory muscles to breathe Cardiovascular:  Irregularly Irregular Rate; Bilateral LE with Right worse than left Abdomen: Soft, NT, ND. Bowel sounds present GU: Deferred Musculoskeletal: No contractures; No cyanosis Skin: Warm and dry. No rashes or lesions on a limited  skin eval Neurologic: CN 2-12 grossly intact. No appreciable focal deficits; Bilateral legs are weak Psychiatric: Impaired judgement and insight. Awake and alert. Slightly confused and still delrious.   Data Reviewed: I have personally reviewed following labs and imaging studies  CBC:  Recent Labs Lab 03/17/17 0807 03/17/17 1939 03/18/17 0456 03/19/17 0319  WBC 7.6 8.6 7.0 9.4  NEUTROABS 6.2  --   --  6.9  HGB 7.7* 8.9* 9.2* 9.8*  HCT 26.9* 30.7* 31.7* 33.8*  MCV 82.3 81.4 81.7 82.6  PLT 211 213 195 644   Basic Metabolic Panel:  Recent Labs Lab 03/17/17 0807 03/18/17 0456 03/19/17 0319  NA 136 138 139  K 3.4* 3.1* 3.2*  CL 99* 100* 98*  CO2 29 30 32  GLUCOSE 131* 137* 113*  BUN '18 17 18  ' CREATININE 1.13 1.18 1.22  CALCIUM 8.6* 8.6* 8.9  MG  --   --  1.9  PHOS  --   --  2.6   GFR: Estimated Creatinine Clearance: 46.8 mL/min (by C-G formula based on SCr of 1.22 mg/dL). Liver Function Tests:  Recent Labs Lab 03/19/17 0319  AST 26  ALT 26  ALKPHOS 66  BILITOT 1.7*  PROT 6.5  ALBUMIN 3.8   No results for input(s): LIPASE, AMYLASE in the last 168 hours. No results for input(s): AMMONIA in the last 168 hours. Coagulation Profile: No results for input(s): INR, PROTIME in the last 168 hours. Cardiac Enzymes: No results for input(s): CKTOTAL, CKMB, CKMBINDEX, TROPONINI in the last 168 hours. BNP (last 3 results)  Recent Labs  01/01/17 1254  PROBNP 3,145*   HbA1C: No results for input(s): HGBA1C in the last 72 hours. CBG:  Recent Labs Lab 03/18/17 0736  GLUCAP 140*   Lipid Profile: No results for input(s): CHOL, HDL, LDLCALC, TRIG, CHOLHDL, LDLDIRECT in the last 72 hours. Thyroid Function Tests: No results for input(s): TSH, T4TOTAL, FREET4, T3FREE, THYROIDAB in the last 72 hours. Anemia Panel: No results for input(s): VITAMINB12, FOLATE, FERRITIN, TIBC, IRON, RETICCTPCT in the last 72 hours. Sepsis Labs: No results for input(s): PROCALCITON,  LATICACIDVEN in the last 168 hours.  Recent Results (from the past 240 hour(s))  Culture, Urine     Status: Abnormal   Collection Time: 03/18/17  2:59 PM  Result Value Ref Range Status   Specimen Description URINE, RANDOM  Final   Special Requests NONE  Final   Culture MULTIPLE SPECIES PRESENT, SUGGEST RECOLLECTION (A)  Final   Report Status 03/19/2017 FINAL  Final     Radiology Studies: Ct Head Wo Contrast  Result Date: 03/18/2017 CLINICAL DATA:  83 y/o M; altered level of consciousness. History of lymphoma post chemotherapy. EXAM: CT HEAD WITHOUT CONTRAST TECHNIQUE: Contiguous axial images were obtained from the base of the skull through the vertex without intravenous contrast. COMPARISON:  None. FINDINGS: Brain: No evidence of acute infarction, hemorrhage, hydrocephalus, extra-axial collection or mass lesion/mass effect. Small  chronic right occipital lobe infarction. Foci of hypoattenuation in white matter are nonspecific but compatible with moderate chronic microvascular ischemic changes and there is moderate brain parenchymal volume loss. Vascular: No hyperdense vessel. Calcific atherosclerosis of carotid siphons. Skull: Normal. Negative for fracture or focal lesion. Sinuses/Orbits: Small right maxillary sinus mucous retention cyst. Otherwise negative. Other: None. IMPRESSION: 1. No acute intracranial abnormality identified. 2. Moderate chronic microvascular ischemic changes and moderate parenchymal volume loss of the brain. Small chronic right occipital lobe infarction. Electronically Signed   By: Kristine Garbe M.D.   On: 03/18/2017 22:16   Scheduled Meds: . amiodarone  200 mg Oral BID  . divalproex  250 mg Oral Q12H  . enoxaparin (LOVENOX) injection  40 mg Subcutaneous Daily  . furosemide  80 mg Intravenous BID  . hydrOXYzine  25 mg Oral QHS  . iron polysaccharides  150 mg Oral BID  . metoprolol succinate  100 mg Oral Daily  . pantoprazole  40 mg Oral BID  . potassium  chloride  40 mEq Oral BID  . pravastatin  40 mg Oral Daily  . ranolazine  500 mg Oral BID  . sodium chloride flush  3 mL Intravenous Q12H   Continuous Infusions: . sodium chloride    . sodium chloride      LOS: 2 days   Kerney Elbe, DO [facility] Pager [phone]  If 7PM-7AM, please contact night-coverage [email or web address] Password [email or web address] 03/19/2017, 7:07 PM
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
## 2017-03-19 NOTE — Progress Notes (Signed)
On and off aggression towards staff. Redirected and kept calm.

## 2017-03-19 NOTE — Progress Notes (Signed)
Patient refusing telemetry. Pulled all leads off and undressing.

## 2017-03-20 ENCOUNTER — Encounter: Payer: Medicare Other | Admitting: Nurse Practitioner

## 2017-03-20 DIAGNOSIS — Z66 Do not resuscitate: Secondary | ICD-10-CM

## 2017-03-20 DIAGNOSIS — Z515 Encounter for palliative care: Secondary | ICD-10-CM

## 2017-03-20 LAB — COMPREHENSIVE METABOLIC PANEL
ALK PHOS: 61 U/L (ref 38–126)
ALT: 25 U/L (ref 17–63)
ANION GAP: 6 (ref 5–15)
AST: 27 U/L (ref 15–41)
Albumin: 3.4 g/dL — ABNORMAL LOW (ref 3.5–5.0)
BUN: 21 mg/dL — ABNORMAL HIGH (ref 6–20)
CALCIUM: 8.8 mg/dL — AB (ref 8.9–10.3)
CHLORIDE: 103 mmol/L (ref 101–111)
CO2: 33 mmol/L — AB (ref 22–32)
CREATININE: 1.35 mg/dL — AB (ref 0.61–1.24)
GFR calc Af Amer: 53 mL/min — ABNORMAL LOW (ref >60)
GFR, EST NON AFRICAN AMERICAN: 46 mL/min — AB (ref >60)
Glucose, Bld: 109 mg/dL — ABNORMAL HIGH (ref 65–99)
Potassium: 4.1 mmol/L (ref 3.5–5.1)
Sodium: 142 mmol/L (ref 135–145)
Total Bilirubin: 1.6 mg/dL — ABNORMAL HIGH (ref 0.3–1.2)
Total Protein: 5.9 g/dL — ABNORMAL LOW (ref 6.5–8.1)

## 2017-03-20 LAB — CBC WITH DIFFERENTIAL/PLATELET
BASOS PCT: 0 %
Basophils Absolute: 0 10*3/uL (ref 0.0–0.1)
EOS PCT: 0 %
Eosinophils Absolute: 0 10*3/uL (ref 0.0–0.7)
HCT: 31.8 % — ABNORMAL LOW (ref 39.0–52.0)
HEMOGLOBIN: 9.4 g/dL — AB (ref 13.0–17.0)
LYMPHS PCT: 10 %
Lymphs Abs: 1 10*3/uL (ref 0.7–4.0)
MCH: 24.5 pg — AB (ref 26.0–34.0)
MCHC: 29.6 g/dL — ABNORMAL LOW (ref 30.0–36.0)
MCV: 83 fL (ref 78.0–100.0)
MONO ABS: 1.5 10*3/uL — AB (ref 0.1–1.0)
Monocytes Relative: 15 %
NEUTROS PCT: 75 %
Neutro Abs: 7.2 10*3/uL (ref 1.7–7.7)
PLATELETS: 184 10*3/uL (ref 150–400)
RBC: 3.83 MIL/uL — AB (ref 4.22–5.81)
RDW: 20.2 % — ABNORMAL HIGH (ref 11.5–15.5)
WBC: 9.7 10*3/uL (ref 4.0–10.5)

## 2017-03-20 LAB — PHOSPHORUS: Phosphorus: 2.3 mg/dL — ABNORMAL LOW (ref 2.5–4.6)

## 2017-03-20 LAB — MAGNESIUM: MAGNESIUM: 2.1 mg/dL (ref 1.7–2.4)

## 2017-03-20 MED ORDER — ACETAMINOPHEN 325 MG PO TABS
650.0000 mg | ORAL_TABLET | Freq: Three times a day (TID) | ORAL | Status: DC
  Administered 2017-03-21: 650 mg via ORAL
  Filled 2017-03-20 (×3): qty 2

## 2017-03-20 MED ORDER — OLANZAPINE 5 MG PO TABS
2.5000 mg | ORAL_TABLET | Freq: Every day | ORAL | Status: DC
  Administered 2017-03-20: 2.5 mg via ORAL
  Filled 2017-03-20: qty 1

## 2017-03-20 MED ORDER — POTASSIUM CHLORIDE 10 MEQ/100ML IV SOLN
10.0000 meq | INTRAVENOUS | Status: DC
  Filled 2017-03-20: qty 100

## 2017-03-20 MED ORDER — LORAZEPAM 2 MG/ML IJ SOLN: 1.0000 mg | INTRAMUSCULAR | Status: DC | PRN

## 2017-03-20 MED ORDER — HYDROMORPHONE HCL 1 MG/ML IJ SOLN: 0.2500 mg | INTRAMUSCULAR | Status: DC | PRN

## 2017-03-20 MED ORDER — PANTOPRAZOLE SODIUM 40 MG PO TBEC
40.0000 mg | DELAYED_RELEASE_TABLET | Freq: Every day | ORAL | Status: DC
  Administered 2017-03-21: 40 mg via ORAL
  Filled 2017-03-20: qty 1

## 2017-03-20 NOTE — Progress Notes (Addendum)
[facility] Liaison: RN visit  Notified by Crawford Givens, CSW  of patient/family request for [facility] services at home after discharge. Chart and patient information under review by [facility] physician. Hospice eligibility pending at this time.  Writer spoke with family at bedside to initiate education related to hospice philosophy, services and team approach to care.  Patient/family verbalized understanding of information given.  Per discussion, plan is for discharge to home by [facility]  on 03/21/17.   Please send signed and completed DNR form home with patient/family.  Patient will need prescriptions for discharge comfort medications. DME needs have been discussed, patient currently has the following equipment in the home: W/C, walker, 3N1. No other equipment requested at this time.    [facility] aware of the above.   Completed discharge summary will need to be faxed to [facility] at [phone] when final.  Please notify [facility] when patient is ready to leave the unit at discharge. (Call [phone] or [phone] after 5pm.)  [facility] information and contact numbers given to patient's wife  during this visit.  Above information shared with Crawford Givens, CSW.   Please call with any Hospice related questions.  Thank you for this referral  Farrel Gordon, RN, [facility] Liaison [phone]

## 2017-03-20 NOTE — Clinical Social Work Note (Signed)
CSW received consult for residential hospice placement. Talked with wife, Dakota Liu at the bedside regarding consult. Dakota Liu was napping but awakened easily when her name was called. CSW explained reason for visit and CSW's role. Dakota Liu informed CSW that she is familiar with hospice as her daughter died 3 years ago at [facility]. Patient appeared sad and CSW engaged wife in conversation regarding her feelings and listened empathically. Dakota Liu is in agreement with hospice and wants [facility]. Call made to [facility] staff person [facility] and referral made. Dakota Liu visited with wife and patient will discharge home with hospice services as wife feels that he husband will not agree to [employer]. Nurse updated on discharge plan for patient. CSW signing off, however please reconsult if any additional SW intervention services needed prior to discharge.  Dakota Liu, MSW, LCSW Licensed Clinical Social Worker [place] [phone]

## 2017-03-20 NOTE — Progress Notes (Addendum)
Patient seen and evaluated and I discussed his care with his wife at the bedside. She describes a bad "episode" this morning where he appeared to be actively dying, he continues to be paranoid and gets anxious when she leaves the room. I agree that he will; be difficult to manage at home and that right now his wife is overwhelmed. He is most appropriate for a hospice facility -at this time he is stable enough to transfer but his prognosis is likely to be very short-<2 weeks. Wife needs support for caregiver burnout- will ask chaplains to see her for additional support.  I discontinued his PM dose of Atarax/Vistaril since this can cause hallucinations. He got IV ativan last PM and it helped him to sleep per his wife.  Goals are full comfort care.  Lane Hacker, DO Palliative Medicine  Time: 25 min Greater than 50%  of this time was spent counseling and coordinating care related to the above assessment and plan.

## 2017-03-20 NOTE — Progress Notes (Signed)
PROGRESS NOTE    ROB MCIVER  PNT:[id number] DOB: June 26, 1928 DOA: 03/17/2017 PCP: Christain Sacramento, MD   Brief Narrative: Dakota RODDEY is a very pleasant slightly HOH 83 y.o. male with medical history significant for chronic systolic heart failure, CAD status post CABG 1988, PCI 2012, s/p pacer A. fib, hypertension, dyslipidemia, since emergency department with persistent worsening shortness of breath. Initial evaluation reveals acute respiratory distress likely related to acute on chronic systolic heart failure in the setting of worsening anemia and Atrial Fibrillation. He reports gradual worsening shortness of breath over the last several days. Associated symptoms include gradual weight gain and increased lower extremity edema. He reports he received blood transfusion last month. He reports he was evaluated for GI bleed due to low hemoglobin but states "they can't find where the bleeding scum in from". He also reports he saw his cardiologist 5 days ago they recommended Lasix 40 mg twice a day for 5 days. He denies chest pain palpitations headache dizziness syncope or near-syncope. Was admitted and Cardiology was consulted along with EP and patient currently being diuresed. Hospitalization has been complicated by Delirium. Discussed case with Neuro. Patient was made 1:1 and Psychiatry was called because patient was still agitated. Palliative Care met with the family and patient and wife elected to go Comfort Measures as aggressive medical therapy limits reached from End-stage CHF. Goals of Care were shifted for full comfort and patient was most appropriate for a Residential Hospice facility and wife wanted [facility] however thinks that patient will refuse Residential Hospice so have elected to take him home with Hospice Services. Plan is to D/C Home in AM.   Assessment & Plan:   Principal Problem:   Acute respiratory distress Active Problems:   Ischemic cardiomyopathy- EF improved to 40-45%  10/14   Generalized weakness   ASCVD (arteriosclerotic cardiovascular disease)   PVD (peripheral vascular disease)- AAA R&G Nov 2010   Dyslipidemia   Anemia, chronic disease   Biventricular cardiac pacemaker in situ   Persistent atrial fibrillation (HCC)   GERD (gastroesophageal reflux disease)   Hyperlipidemia   Hypertension   Hypokalemia   Paraplegia ([place])   Delirium   Palliative care by specialist   DNR (do not resuscitate)   Acute respiratory distress likely related to acute on chronic systolic heart failure in the setting of worsening anemia, improved  -Patient with sitting shortness of breath over the last several weeks. Chest x-ray reveals low lung volumes with bibasilar atelectasis/infiltrate stable cardiomegaly. He was provided 40 mg a Lasix in the emergency department. Chart review indicates he is evaluated by cardiology last week and medications adjusted at that time -Admitted To Telemetry  -Oxygen supplementation -Continue IV Lasix as below -Intake and output; Patient is -5.277 Liters. -Daily weights, however were not accurate -Nebulizer 2 -Cardiology consulted and appreciate Reccs  -Palliative Care Consulted and goals of Care changed and patient electing to go Hospice and will D/C tomorrow   Acute on chronic systolic heart failure in the setting of Atrial Fibrillation -Recent TEE reveals EF of 30%, not overtly to severely reduced systolic function, regional wall motion abnormalities.  -BNP 682. EKG as noted above. Initial troponin negative. He was provided with 40 mg Lasix IV in the emergency department -Continue IV Lasix and Cardiology increased to 80 mg IV BID -Daily weights (inacurate)  -Intake and output; -5.277 Liters -Continue amiodarone, metoprolol -Of note chart review indicates patient has been closely followed by cardiology in several adjustments have been made  to medications -Appreciate Cardiology recommendations; Cannot have DCCV and cannot be  anticoagulated -Continue to Monitor  -Cardiology Recommending Palliative Care Consultation and Consideration of Hospice as per Cardiology it has been difficult to maintain Heart Failure Compensation when he is in Persistent A Fib and because he does not want any aggressive intervention including but not limited to repeat Cardioversion  -Palliative Care met with Patient and Family and Goals of Care changed to Comfort and plan is to D/C Home tomorrow as wife believes patient will refuse Residential Hospice  Anemia of chronic disease/iron deficiency anemia.  -History of same. Hemoglobin 7.7 on admission. Chart review indicates this is down from 9.8 last month.  -Patient states he has had a GI workup outpatient -FOBT -Transfused 1 unit of packed blood cells -Monitor; Hb/Hct now 9.4/31.8 -Will not repeat CBC as goals of Care is now Comfort  A. fib status post cardioversion.  -Chadvasc score 5.  -Not on anticoagulation at this point secondary to recurrent anemia. EKG as noted above. Home meds include amiodarone and metoprolol -Continue home meds -Not Anticoagulated -EP Consulted and do not have much to offer and do not think he is a candidate for additional Therapy for his BiV device because patient does not want to be Anticoagulated for the purposes of DCCV and has failed Amiodarone -Patient now Comfort Care and will go home with Hospice in AM  CAD.  -No chest pain. EKG as noted above. Last 2014. -Continue home meds -Now Comfort Care and will go Home with Hospice  Hypertension.  -Fair control in the emergency department. -Continue home meds of Metoprolol  Hypokalemia -K+ was 3.2 and improved to 4.1 -Replete with po KCl  -Will not repeat CMP as patient is now [place] and going home with Hospice in AM  [facility] Delirium in the setting of likely Neurodegenerative Dementia  -Started having Paranoid Delusions of his wife cheating on him -Delirium Precautions -R/o Infection  etiology and obtained U/A which was unremarkable and Urine Cx showed Multiple Species Present and suggested re-collection, CXR not revealing for Infection; No nunchal rigidity so less likely Meningitis -Obtained Head CT and showed No acute intracranial abnormality identified. Moderate chronic microvascular ischemic changes and moderate parenchymal volume loss of the brain. Small chronic right occipital lobe infarction. -Check TSH (6.600), B12, Thiamine, Ammonia, LFTs, RPR, and ESR which were not drawn yet as patient refused  -OOB to Chair Daily -Limit Sedating Medications -Psychiatry consulted and patient made 1:1 -Patient became agitated again this Evening so was given 2 mg of Lorezepam as Antipsychotics can't be used with a prolonged qT -Psych Recommended Depakote Sprinkles 250 mg po q12h and Vistaril 25 mg po qHS; Vistaril now D/C'd -Continue to Reassure and re-direct -Palliative Care Consulted for further Evaluation and patient now Comfort Care; Medications for Comfort include Hydromorphone 0.25 mg IV q4hprn for Dyspnea/Distress/Agitation, Lorazepam 1 mg IV q4hprn for Anxiety Sedation, Olanzapine 2.5 mg po qhS, and Zofran 4 mg IV q6hprn  DVT prophylaxis: Comfort Care so None Code Status: DO NOT RESUSCITATE; Comfort Care Family Communication: Discussed plan of Care with Wife Disposition Plan: Home Hospice as wife thinks will not go to Residential Hospice  Consultants:   Cardiology  Electrophysiology  Discussed Case with Neurology   Psychiatry  Palliative Care Medicine    Procedures:  None  Antimicrobials:  Anti-infectives    None     Subjective: Seen and examined and was sleeping this AM and I did not disturb him as wife stated he  finally was getting rest. Discussed with the wife that he seemed better today and and that they were considering Hospice.  Objective: Vitals:   03/20/17 0007 03/20/17 0543 03/20/17 0846 03/20/17 1656  BP: 122/90 (!) 104/50 120/65 112/66    Pulse: 80 68 80 86  Resp: _0 Temp: 98 F (36.7 C) 98.4 F (36.9 C) 98.1 F (36.7 C) 98 F (36.7 C)  TempSrc: Oral Oral Axillary Oral  SpO2: 95% 90% 93% 99%  Weight: 77.6 kg (171 lb)     Height:        Intake/Output Summary (Last 24 hours) at 03/20/17 1922 Last data filed at 03/20/17 1400  Gross per 24 hour  Intake                3 ml  Output             2175 ml  Net            -2172 ml   Filed Weights   03/17/17 2144 03/18/17 2136 03/20/17 0007  Weight: 78.6 kg (173 lb 4.5 oz) 78.5 kg (173 lb) 77.6 kg (171 lb)   Examination: Physical Exam:  Constitutional: Elderly Caucasian Male in NAD Eyes: Lids Normal ENMT: External Ears and nose appear normal Neck: Supple with some JVD Respiratory: Diminished to Auscultation with no wheezing/rales/rhonchi. Patient was not tachypenic and was wearing 2 liters of O2 via [place] Cardiovascular:  Irregularly Irregular. LE Edema noted Abdomen: Soft, NT, ND.  GU: Deferred Musculoskeletal: No contractures; No cyanosis Skin: Warm and Dry.  Neurologic: Somnolent; Spontaneously moves extremities Psychiatric: Calm and sleeping. Not as agitated per wife.   Data Reviewed: I have personally reviewed following labs and imaging studies  CBC:  Recent Labs Lab 03/17/17 0807 03/17/17 1939 03/18/17 0456 03/19/17 0319 03/20/17 0939  WBC 7.6 8.6 7.0 9.4 9.7  NEUTROABS 6.2  --   --  6.9 7.2  HGB 7.7* 8.9* 9.2* 9.8* 9.4*  HCT 26.9* 30.7* 31.7* 33.8* 31.8*  MCV 82.3 81.4 81.7 82.6 83.0  PLT 211 213 195 244 161   Basic Metabolic Panel:  Recent Labs Lab 03/17/17 0807 03/18/17 0456 03/19/17 0319 03/20/17 0939  NA 136 138 139 142  K 3.4* 3.1* 3.2* 4.1  CL 99* 100* 98* 103  CO2 29 30 32 33*  GLUCOSE 131* 137* 113* 109*  BUN _1 21*  CREATININE 1.13 1.18 1.22 1.35*  CALCIUM 8.6* 8.6* 8.9 8.8*  MG  --   --  1.9 2.1  PHOS  --   --  2.6 2.3*   GFR: Estimated Creatinine Clearance: 42.3 mL/min (A) (by C-G formula based on SCr  of 1.35 mg/dL (H)). Liver Function Tests:  Recent Labs Lab 03/19/17 0319 03/20/17 0939  AST 26 27  ALT 26 25  ALKPHOS 66 61  BILITOT 1.7* 1.6*  PROT 6.5 5.9*  ALBUMIN 3.8 3.4*   No results for input(s): LIPASE, AMYLASE in the last 168 hours. No results for input(s): AMMONIA in the last 168 hours. Coagulation Profile: No results for input(s): INR, PROTIME in the last 168 hours. Cardiac Enzymes: No results for input(s): CKTOTAL, CKMB, CKMBINDEX, TROPONINI in the last 168 hours. BNP (last 3 results)  Recent Labs  01/01/17 1254  PROBNP 3,145*   HbA1C: No results for input(s): HGBA1C in the last 72 hours. CBG:  Recent Labs Lab 03/18/17 0736  GLUCAP 140*   Lipid Profile: No results for input(s): CHOL, HDL, LDLCALC, TRIG, CHOLHDL, LDLDIRECT  in the last 72 hours. Thyroid Function Tests: No results for input(s): TSH, T4TOTAL, FREET4, T3FREE, THYROIDAB in the last 72 hours. Anemia Panel: No results for input(s): VITAMINB12, FOLATE, FERRITIN, TIBC, IRON, RETICCTPCT in the last 72 hours. Sepsis Labs: No results for input(s): PROCALCITON, LATICACIDVEN in the last 168 hours.  Recent Results (from the past 240 hour(s))  Culture, Urine     Status: Abnormal   Collection Time: 03/18/17  2:59 PM  Result Value Ref Range Status   Specimen Description URINE, RANDOM  Final   Special Requests NONE  Final   Culture MULTIPLE SPECIES PRESENT, SUGGEST RECOLLECTION (A)  Final   Report Status 03/19/2017 FINAL  Final     Radiology Studies: Ct Head Wo Contrast  Result Date: 03/18/2017 CLINICAL DATA:  83 y/o M; altered level of consciousness. History of lymphoma post chemotherapy. EXAM: CT HEAD WITHOUT CONTRAST TECHNIQUE: Contiguous axial images were obtained from the base of the skull through the vertex without intravenous contrast. COMPARISON:  None. FINDINGS: Brain: No evidence of acute infarction, hemorrhage, hydrocephalus, extra-axial collection or mass lesion/mass effect. Small chronic  right occipital lobe infarction. Foci of hypoattenuation in white matter are nonspecific but compatible with moderate chronic microvascular ischemic changes and there is moderate brain parenchymal volume loss. Vascular: No hyperdense vessel. Calcific atherosclerosis of carotid siphons. Skull: Normal. Negative for fracture or focal lesion. Sinuses/Orbits: Small right maxillary sinus mucous retention cyst. Otherwise negative. Other: None. IMPRESSION: 1. No acute intracranial abnormality identified. 2. Moderate chronic microvascular ischemic changes and moderate parenchymal volume loss of the brain. Small chronic right occipital lobe infarction. Electronically Signed   By: Kristine Garbe M.D.   On: 03/18/2017 22:16   Scheduled Meds: . acetaminophen  650 mg Oral TID  . amiodarone  200 mg Oral BID  . divalproex  250 mg Oral Q12H  . furosemide  80 mg Intravenous BID  . metoprolol succinate  100 mg Oral Daily  . OLANZapine  2.5 mg Oral QHS  . [START ON 03/21/2017] pantoprazole  40 mg Oral Daily  . potassium chloride  40 mEq Oral BID  . ranolazine  500 mg Oral BID  . sodium chloride flush  3 mL Intravenous Q12H   Continuous Infusions: . sodium chloride    . sodium chloride      LOS: 3 days   Kerney Elbe, DO [facility] Pager [phone]  If 7PM-7AM, please contact night-coverage [email or web address] Password [facility] 03/20/2017, 7:22 PM

## 2017-03-20 NOTE — Plan of Care (Signed)
Problem: Pain Managment: Goal: General experience of comfort will improve Outcome: Progressing Patient denies pain currently. Patient re-adjusted in bed and given coke. Emotional support given to wife. Will continue to provide comfort measures for patient.

## 2017-03-20 NOTE — Progress Notes (Signed)
Progress Note  Patient Name: Dakota Liu Date of Encounter: 03/20/2017  Primary Cardiologist: Hunner Garcon  Subjective   Continues to deteriorate. Worsening delirium. Had near syncopal event when he took his oxygen off. Had good urine output, but despite this he has bilateral rales.  Inpatient Medications    Scheduled Meds: . amiodarone  200 mg Oral BID  . divalproex  250 mg Oral Q12H  . enoxaparin (LOVENOX) injection  40 mg Subcutaneous Daily  . furosemide  80 mg Intravenous BID  . hydrOXYzine  25 mg Oral QHS  . iron polysaccharides  150 mg Oral BID  . metoprolol succinate  100 mg Oral Daily  . pantoprazole  40 mg Oral BID  . potassium chloride  40 mEq Oral BID  . pravastatin  40 mg Oral Daily  . ranolazine  500 mg Oral BID  . sodium chloride flush  3 mL Intravenous Q12H   Continuous Infusions: . sodium chloride    . sodium chloride     PRN Meds: sodium chloride, acetaminophen, hydrOXYzine, ondansetron (ZOFRAN) IV, sodium chloride flush   Vital Signs    Vitals:   03/19/17 1814 03/20/17 0007 03/20/17 0543 03/20/17 0846  BP: 124/68 122/90 (!) 104/50 120/65  Pulse: 88 80 68 80  Resp: 18 18 18 19   Temp: 98 F (36.7 C) 98 F (36.7 C) 98.4 F (36.9 C) 98.1 F (36.7 C)  TempSrc: Oral Oral Oral Axillary  SpO2: 96% 95% 90% 93%  Weight:  171 lb (77.6 kg)    Height:        Intake/Output Summary (Last 24 hours) at 03/20/17 1209 Last data filed at 03/20/17 1056  Gross per 24 hour  Intake              243 ml  Output             1825 ml  Net            -1582 ml   Filed Weights   03/17/17 2144 03/18/17 2136 03/20/17 0007  Weight: 173 lb 4.5 oz (78.6 kg) 173 lb (78.5 kg) 171 lb (77.6 kg)    Telemetry    Atrial fibrillation with ventricular pacing - Personally Reviewed  ECG    No new tracing - Personally Reviewed  Physical Exam  Mildly agitated, intermittently drowsy GEN: No acute distress.   Neck:  moderately elevated JVD Cardiac: RRR, split S2, no  murmurs, rubs, S3 gallop.  Respiratory: Bilateral rales GI: Soft, nontender, non-distended  MS: No edema; No deformity. Neuro:  Nonfocal  Psych: Normal affect   Labs    Chemistry Recent Labs Lab 03/18/17 0456 03/19/17 0319 03/20/17 0939  NA 138 139 142  K 3.1* 3.2* 4.1  CL 100* 98* 103  CO2 30 32 33*  GLUCOSE 137* 113* 109*  BUN 17 18 21*  CREATININE 1.18 1.22 1.35*  CALCIUM 8.6* 8.9 8.8*  PROT  --  6.5 5.9*  ALBUMIN  --  3.8 3.4*  AST  --  26 27  ALT  --  26 25  ALKPHOS  --  66 61  BILITOT  --  1.7* 1.6*  GFRNONAA 54* 51* 46*  GFRAA >60 60* 53*  ANIONGAP 8 9 6      Hematology Recent Labs Lab 03/18/17 0456 03/19/17 0319 03/20/17 0939  WBC 7.0 9.4 PENDING  RBC 3.88* 4.09* 3.83*  HGB 9.2* 9.8* 9.4*  HCT 31.7* 33.8* 31.8*  MCV 81.7 82.6 83.0  MCH 23.7* 24.0* 24.5*  MCHC 29.0* 29.0* 29.6*  RDW 19.3* 19.3* 20.2*  PLT 195 244 184    Cardiac EnzymesNo results for input(s): TROPONINI in the last 168 hours.  Recent Labs Lab 03/17/17 0806  TROPIPOC 0.00     BNP Recent Labs Lab 03/17/17 0808  BNP 682.5*     DDimer No results for input(s): DDIMER in the last 168 hours.   Radiology    Ct Head Wo Contrast  Result Date: 03/18/2017 CLINICAL DATA:  83 y/o M; altered level of consciousness. History of lymphoma post chemotherapy. EXAM: CT HEAD WITHOUT CONTRAST TECHNIQUE: Contiguous axial images were obtained from the base of the skull through the vertex without intravenous contrast. COMPARISON:  None. FINDINGS: Brain: No evidence of acute infarction, hemorrhage, hydrocephalus, extra-axial collection or mass lesion/mass effect. Small chronic right occipital lobe infarction. Foci of hypoattenuation in white matter are nonspecific but compatible with moderate chronic microvascular ischemic changes and there is moderate brain parenchymal volume loss. Vascular: No hyperdense vessel. Calcific atherosclerosis of carotid siphons. Skull: Normal. Negative for fracture or  focal lesion. Sinuses/Orbits: Small right maxillary sinus mucous retention cyst. Otherwise negative. Other: None. IMPRESSION: 1. No acute intracranial abnormality identified. 2. Moderate chronic microvascular ischemic changes and moderate parenchymal volume loss of the brain. Small chronic right occipital lobe infarction. Electronically Signed   By: Kristine Garbe M.D.   On: 03/18/2017 22:16   Patient Profile     83 y.o. male with acute on chronic systolic heart failure, persistent atrial fibrillation that leads to heart failure decompensation, recurrent despite high-dose amiodarone therapy, CRT with intermittent LV capture, unable to take long-term anticoagulation due to recurrent severe anemia that leads to transfusions. Now with deteriorating mental status, delirium, physical exam consistent with pulmonary edema.  Assessment & Plan    Unfortunately, this elderly gentleman has reached the limits of what we can offer with aggressive medical therapy. He has numerous comorbid conditions that make management of arrhythmia and heart failure a challenge. Even before he developed delirium, he expressed the desire to avoid any additional aggressive interventions.  Appreciate palliative care's evaluation, agreed that he will require hospice care. After the last 24 hours, I wonder whether he will make it to hospital discharge.  Due to his altered mental status, his relationship with his wife has become very strained and he rejects her offers of comfort and assistance.  He may require inpatient hospice and it is probably time to transition to comfort care.  For questions or updates, please contact [place] Please consult [email or web address] for contact info under Cardiology/STEMI.      Signed, Sanda Klein, MD  03/20/2017, 12:09 PM

## 2017-03-21 MED ORDER — MORPHINE SULFATE 20 MG/5ML PO SOLN: 2.5000 mg | ORAL | 0 refills | Status: AC | PRN

## 2017-03-21 MED ORDER — LORAZEPAM 1 MG PO TABS: 1.0000 mg | ORAL_TABLET | Freq: Three times a day (TID) | ORAL | 0 refills | Status: AC | PRN

## 2017-03-21 MED ORDER — DIVALPROEX SODIUM 125 MG PO CSDR: 250.0000 mg | DELAYED_RELEASE_CAPSULE | Freq: Two times a day (BID) | ORAL | 0 refills | Status: AC

## 2017-03-21 MED ORDER — FUROSEMIDE 20 MG PO TABS: 80.0000 mg | ORAL_TABLET | Freq: Two times a day (BID) | ORAL | 3 refills | Status: AC

## 2017-03-21 MED ORDER — OLANZAPINE 2.5 MG PO TABS: 2.5000 mg | ORAL_TABLET | Freq: Every day | ORAL | 0 refills | Status: AC

## 2017-03-21 MED ORDER — ONDANSETRON 8 MG PO TBDP: 8.0000 mg | ORAL_TABLET | Freq: Three times a day (TID) | ORAL | 0 refills | Status: AC | PRN

## 2017-03-21 NOTE — Progress Notes (Signed)
IV discontinued,catheter intact. Discharge instructions given to patient,and wife, on medications,and follow up visits, verbalized understanding. [facility] is transporting patient home. Oxygen at 2 liters nasal canula. No c/o pain or discomfort noted.

## 2017-03-21 NOTE — Progress Notes (Signed)
Progress Note  Patient Name: Dakota Liu Date of Encounter: 03/21/2017  Primary Cardiologist: Gionni Freese  Subjective   Calmer, less confused. Understands he has a terminal illness and wants to go home. Wife and children have agreed to take him home. Hospice meeting this afternoon.  Inpatient Medications    Scheduled Meds: . acetaminophen  650 mg Oral TID  . divalproex  250 mg Oral Q12H  . furosemide  80 mg Intravenous BID  . metoprolol succinate  100 mg Oral Daily  . OLANZapine  2.5 mg Oral QHS  . pantoprazole  40 mg Oral Daily  . potassium chloride  40 mEq Oral BID  . sodium chloride flush  3 mL Intravenous Q12H   Continuous Infusions: . sodium chloride    . sodium chloride     PRN Meds: sodium chloride, acetaminophen, HYDROmorphone (DILAUDID) injection, LORazepam, ondansetron (ZOFRAN) IV, sodium chloride flush   Vital Signs    Vitals:   03/20/17 0846 03/20/17 1656 03/20/17 2126 03/21/17 0527  BP: 120/65 112/66 124/63 119/62  Pulse: 80 86 86 82  Resp: 19 18 19 20   Temp: 98.1 F (36.7 C) 98 F (36.7 C) 98.5 F (36.9 C) 98.2 F (36.8 C)  TempSrc: Axillary Oral Oral Oral  SpO2: 93% 99% 98% 99%  Weight:   170 lb 10.2 oz (77.4 kg)   Height:        Intake/Output Summary (Last 24 hours) at 03/21/17 1059 Last data filed at 03/21/17 0600  Gross per 24 hour  Intake                0 ml  Output             2800 ml  Net            -2800 ml   Filed Weights   03/18/17 2136 03/20/17 0007 03/20/17 2126  Weight: 173 lb (78.5 kg) 171 lb (77.6 kg) 170 lb 10.2 oz (77.4 kg)    Telemetry    AFib, V paced  - Personally Reviewed  Physical Exam  Orthopneic, but calm GEN: No acute distress.   Neck: No JVD Cardiac: RRR, split S2, no murmurs, rubs, S3 gallop Respiratory: Clear to auscultation bilaterally. GI: Soft, nontender, non-distended  MS: No edema; No deformity. Neuro:  Nonfocal  Psych: Normal affect   Labs    Chemistry Recent Labs Lab 03/18/17 0456  03/19/17 0319 03/20/17 0939  NA 138 139 142  K 3.1* 3.2* 4.1  CL 100* 98* 103  CO2 30 32 33*  GLUCOSE 137* 113* 109*  BUN 17 18 21*  CREATININE 1.18 1.22 1.35*  CALCIUM 8.6* 8.9 8.8*  PROT  --  6.5 5.9*  ALBUMIN  --  3.8 3.4*  AST  --  26 27  ALT  --  26 25  ALKPHOS  --  66 61  BILITOT  --  1.7* 1.6*  GFRNONAA 54* 51* 46*  GFRAA >60 60* 53*  ANIONGAP 8 9 6      Hematology Recent Labs Lab 03/18/17 0456 03/19/17 0319 03/20/17 0939  WBC 7.0 9.4 9.7  RBC 3.88* 4.09* 3.83*  HGB 9.2* 9.8* 9.4*  HCT 31.7* 33.8* 31.8*  MCV 81.7 82.6 83.0  MCH 23.7* 24.0* 24.5*  MCHC 29.0* 29.0* 29.6*  RDW 19.3* 19.3* 20.2*  PLT 195 244 184    Cardiac EnzymesNo results for input(s): TROPONINI in the last 168 hours.  Recent Labs Lab 03/17/17 0806  TROPIPOC 0.00     BNP  Recent Labs Lab 03/17/17 0808  BNP 682.5*    Patient Profile     83 y.o. male with advanced stage D heart failure and recurrent persistent atrial fibrillation despite amiodarone and DCCV, leading to loss of CRT and heart failure decompensation.  Assessment & Plan    Palliative/hospice care is appropriate and desired by the patient and his family. They have decided on home hospice. Mrs. Voorheis would NOT be able to do this alone in my opinion, but she says that their children will help, one of her sons will take family leave. I recommended a hospital bed (patient declines at this point, I asked him to reconsider), since orthopnea is likely to worsen. Stop amiodarone and ranolazine. Continue furosemide for comfort. Can also stop metoprolol if desired, but this will likely hasten his deterioration.  For questions or updates, please contact [place] Please consult [email or web address] for contact info under Cardiology/STEMI.      Signed, Sanda Klein, MD  03/21/2017, 10:59 AM

## 2017-03-21 NOTE — Discharge Summary (Signed)
Physician Discharge Summary  Dakota Liu KGS:[id number] DOB: 04/21/1928 DOA: 03/17/2017  PCP: Christain [place], MD  Admit date: 03/17/2017 Discharge date: 03/21/2017  Admitted From: Home Disposition:  Home with Hospice  Recommendations for Outpatient Follow-up:  Follow up Care per Hospice Protocol   Home Health: No  Equipment/Devices: None has Equipment at Home  Discharge Condition: Guarded; Comfort Care  CODE STATUS: DO NOT RESUSCITATE  Diet recommendation: Heart Healthy Diet  Brief/Interim Summary: Dakota Liu a very pleasant slightly HOH 83 y.o.malewith medical history significant for chronic systolic heart failure, CAD status post CABG 1988, PCI 2012, s/p pacerA. fib, hypertension, dyslipidemia, since emergency department with persistent worsening shortness of breath. Initial evaluation reveals acute respiratory distress likely related to acute on chronic systolic heart failure in the setting of worsening anemia and Atrial Fibrillation. He reports gradual worsening shortness of breath over the last several days. Associated symptoms include gradual weight gain and increased lower extremity edema. He reports he received blood transfusion last month. He reports he was evaluated for GI bleed due to low hemoglobin but states "they can't find where the bleeding scum in from". He also reports he saw his cardiologist 5 days ago they recommended Lasix 40 mg twice a day for 5 days. He denies chest pain palpitations headache dizziness syncope or near-syncope. Was admitted and Cardiology was consulted along with EP and patient currently being diuresed. Hospitalization has been complicated by Delirium. Discussed case with Neuro. Patient was made 1:1 and Psychiatry was called because patient was still agitated. Palliative Care met with the family and patient and wife elected to go Comfort Measures as aggressive medical therapy limits reached from End-stage CHF. Goals of Care were shifted for full  comfort and patient was most appropriate for a Residential Hospice facility and wife wanted [facility] however thinks that patient will refuse Residential Hospice so have elected to take him home with Hospice Services. Plan is to D/C Home in AM.   Discharge Diagnoses:  Principal Problem:   Acute respiratory distress Active Problems:   Ischemic cardiomyopathy- EF improved to 40-45% 10/14   Generalized weakness   ASCVD (arteriosclerotic cardiovascular disease)   PVD (peripheral vascular disease)- AAA R&G Nov 2010   Dyslipidemia   Anemia, chronic disease   Biventricular cardiac pacemaker in situ   Persistent atrial fibrillation (HCC)   GERD (gastroesophageal reflux disease)   Hyperlipidemia   Hypertension   Hypokalemia   Paraplegia ([place])   Delirium   Palliative care by specialist   DNR (do not resuscitate)  Acute respiratory distress likely related to acute on chronic systolic heart failure in the setting of worsening anemia, improved  -Patient with sitting shortness of breath over the last several weeks. Chest x-ray reveals low lung volumes with bibasilar atelectasis/infiltrate stable cardiomegaly. He was provided 40 mg a Lasix in the emergency department. Chart review indicates he is evaluated by cardiology last week and medications adjusted at that time -Admitted To Telemetry  -Oxygen supplementation -Continue IV Lasix as below -Intake and output; Patient is -5.277 Liters. -Daily weights, however were not accurate -Nebulizer 2 -Cardiology consulted and appreciate Reccs  -Palliative Care Consulted and goals of Care changed and patient electing to go Hospice and will D/C Today -Meds for comfort prescribed including Liquid Morphine, Zofran, Olanzapine, and Ativan.    Acute on chronic systolic heart failure in the setting of Atrial Fibrillation -Recent TEE reveals EF of 30%, not overtly to severely reduced systolic function, regional wall motion abnormalities.  -BNP  682. EKG as  noted above. Initial troponin negative. He was provided with 40 mg Lasix IV in the emergency department -Continue IV Lasix and Cardiology increased to 80 mg IV BID -Daily weights (inacurate)  -Intake and output; -5.277 Liters -Continue amiodarone, metoprolol -Of note chart review indicates patient has been closely followed by cardiology in several adjustments have been made to medications -Appreciate Cardiology recommendations; Cannot have DCCV and cannot be anticoagulated -Continue to Monitor  -Cardiology Recommending Palliative Care Consultation and Consideration of Hospice as per Cardiology it has been difficult to maintain Heart Failure Compensation when he is in Persistent A Fib and because he does not want any aggressive intervention including but not limited to repeat Cardioversion  -Palliative Care met with Patient and Family and Goals of Care changed to Comfort and plan is to D/C Home today as patient refuses Residential Hospice placement  Anemia of chronic disease/iron deficiency anemia.  -History of same. Hemoglobin 7.7 on admission. Chart review indicates this is down from 9.8 last month.  -Patient states he has had a GI workup outpatient -FOBT -Transfused 1 unit of packed blood cells -Monitor; Hb/Hct now 9.4/31.8 -Will not repeat CBC as goals of Care is now Comfort  A. fib status post cardioversion.  -Chadvasc score 5. -Not on anticoagulation at this point secondary to recurrent anemia. EKG as noted above. Home meds include amiodarone and metoprolol -Continue home meds -Not Anticoagulated -EP Consulted and do not have much to offer and do not think he is a candidate for additional Therapy for his BiV device because patient does not want to be Anticoagulated for the purposes of DCCV and has failed Amiodarone -Patient now Comfort Care and will go home with Hospice this AM  CAD.  -No chest pain. EKG as noted above. Last 2014. -Continue home meds -Now Comfort Care and will  go Home with Hospice today  Hypertension.  -Fair control in the emergency department. -Continue home meds of Metoprolol and Lasix for Comfort  Hypokalemia -K+ was 3.2 and improved to 4.1 -Replete with po KCl  -Will not repeat CMP as patient is now [place] and going home with Hospice this AM  [facility] Delirium in the setting of likely Neurodegenerative Dementia  -Started having Paranoid Delusions of his wife cheating on him -Delirium Precautions -R/o Infection etiology and obtained U/A which was unremarkable and Urine Cx showed Multiple Species Present and suggested re-collection, CXR not revealing for Infection; No nunchal rigidity so less likely Meningitis -Obtained Head CT and showed No acute intracranial abnormality identified. Moderate chronic microvascular ischemic changes and moderate parenchymal volume loss of the brain. Small chronic right occipital lobe infarction. -Check TSH (6.600), B12, Thiamine, Ammonia, LFTs, RPR, and ESR which were not drawn yet as patient refused  -OOB to Chair Daily -Limit Sedating Medications -Psychiatry consulted and patient made 1:1 -Patient became agitated again this Evening so was given 2 mg of Lorezepam as Antipsychotics can't be used with a prolonged qT -Psych Recommended Depakote Sprinkles 250 mg po q12h and Vistaril 25 mg po qHS; Vistaril now D/C'd -Continue to Reassure and re-direct -Palliative Care Consulted for further Evaluation and patient now Comfort Care; Medications for Comfort include Morphine, Lorazepam, Zofran and Olanzapine. Contine Depakote for Mood and Anxiety.   Discharge Instructions  Discharge Instructions    Call MD for:  difficulty breathing, headache or visual disturbances    Complete by:  As directed    Call MD for:  extreme fatigue    Complete by:  As directed    Call MD for:  persistant dizziness or light-headedness    Complete by:  As directed    Call MD for:  persistant nausea and vomiting     Complete by:  As directed    Call MD for:  redness, tenderness, or signs of infection (pain, swelling, redness, odor or green/yellow discharge around incision site)    Complete by:  As directed    Call MD for:  severe uncontrolled pain    Complete by:  As directed    Call MD for:  temperature >100.4    Complete by:  As directed    Diet - low sodium heart healthy    Complete by:  As directed    Discharge instructions    Complete by:  As directed    Follow up Care per Hospice Protocol   Increase activity slowly    Complete by:  As directed      Allergies as of 03/21/2017      Reactions   Tramadol Nausea And Vomiting, Other (See Comments)   Dry heaving, GI upset   Ace Inhibitors Cough   Codeine Nausea And Vomiting   Simvastatin Other (See Comments)   Myalgia- high doses (takes 67m at home without issue)      Medication List    STOP taking these medications   amiodarone 200 MG tablet Commonly known as:  PACERONE   iron polysaccharides 150 MG capsule Commonly known as:  NU-IRON   nitroGLYCERIN 0.4 MG SL tablet Commonly known as:  NITROSTAT   pantoprazole 40 MG tablet Commonly known as:  PROTONIX   pravastatin 40 MG tablet Commonly known as:  PRAVACHOL   ranolazine 500 MG 12 hr tablet Commonly known as:  RANEXA   simvastatin 20 MG tablet Commonly known as:  ZOCOR     TAKE these medications   BENGAY EX Apply 1 application topically as needed (for pain).   divalproex 125 MG capsule Commonly known as:  DEPAKOTE SPRINKLE Take 2 capsules (250 mg total) by mouth every 12 (twelve) hours.   furosemide 20 MG tablet Commonly known as:  LASIX Take 4 tablets (80 mg total) by mouth 2 (two) times daily. What changed:  how much to take  when to take this   LORazepam 1 MG tablet Commonly known as:  ATIVAN Take 1 tablet (1 mg total) by mouth every 8 (eight) hours as needed for anxiety.   metoprolol succinate 50 MG 24 hr tablet Commonly known as:  TOPROL-XL TAKE 2  TABLETS IN THE MORNING AND 1 TABLET IN THE EVENING What changed:  See the new instructions.   morphine 20 MG/5ML solution Take 0.6 mLs (2.4 mg total) by mouth every 2 (two) hours as needed for pain.   OLANZapine 2.5 MG tablet Commonly known as:  ZYPREXA Take 1 tablet (2.5 mg total) by mouth at bedtime.   ondansetron 8 MG disintegrating tablet Commonly known as:  ZOFRAN ODT Take 1 tablet (8 mg total) by mouth every 8 (eight) hours as needed for nausea or vomiting.   potassium chloride 10 MEQ tablet Commonly known as:  K-DUR Take 1 tablet (10 mEq total) by mouth daily. What changed:  when to take this            Discharge Care Instructions        Start     Ordered   03/21/17 0000  divalproex (DEPAKOTE SPRINKLE) 125 MG capsule  Every 12 hours     03/21/17  1105   03/21/17 0000  OLANZapine (ZYPREXA) 2.5 MG tablet  Daily at bedtime     03/21/17 1105   03/21/17 0000  morphine 20 MG/5ML solution  Every 2 hours PRN     03/21/17 1105   03/21/17 0000  furosemide (LASIX) 20 MG tablet  2 times daily     03/21/17 1105   03/21/17 0000  LORazepam (ATIVAN) 1 MG tablet  Every 8 hours PRN     03/21/17 1105   03/21/17 0000  ondansetron (ZOFRAN ODT) 8 MG disintegrating tablet  Every 8 hours PRN     03/21/17 1105   03/21/17 0000  Increase activity slowly     03/21/17 1106   03/21/17 0000  Diet - low sodium heart healthy     03/21/17 1106   03/21/17 0000  Discharge instructions    Comments:  Follow up Care per Hospice Protocol   03/21/17 1106   03/21/17 0000  Call MD for:  temperature >100.4     03/21/17 1106   03/21/17 0000  Call MD for:  persistant nausea and vomiting     03/21/17 1106   03/21/17 0000  Call MD for:  severe uncontrolled pain     03/21/17 1106   03/21/17 0000  Call MD for:  difficulty breathing, headache or visual disturbances     03/21/17 1106   03/21/17 0000  Call MD for:  persistant dizziness or light-headedness     03/21/17 1106   03/21/17 0000  Call MD for:   extreme fatigue     03/21/17 1106   03/21/17 0000  Call MD for:  redness, tenderness, or signs of infection (pain, swelling, redness, odor or green/yellow discharge around incision site)     03/21/17 1106      Allergies  Allergen Reactions  . Tramadol Nausea And Vomiting and Other (See Comments)    Dry heaving, GI upset  . Ace Inhibitors Cough  . Codeine Nausea And Vomiting  . Simvastatin Other (See Comments)    Myalgia- high doses (takes 58m at home without issue)    Consultations:  Cardiology and Electrophysiology  Palliative Care Medicine  Hospice  Procedures/Studies: Ct Head Wo Contrast  Result Date: 03/18/2017 CLINICAL DATA:  83y/o M; altered level of consciousness. History of lymphoma post chemotherapy. EXAM: CT HEAD WITHOUT CONTRAST TECHNIQUE: Contiguous axial images were obtained from the base of the skull through the vertex without intravenous contrast. COMPARISON:  None. FINDINGS: Brain: No evidence of acute infarction, hemorrhage, hydrocephalus, extra-axial collection or mass lesion/mass effect. Small chronic right occipital lobe infarction. Foci of hypoattenuation in white matter are nonspecific but compatible with moderate chronic microvascular ischemic changes and there is moderate brain parenchymal volume loss. Vascular: No hyperdense vessel. Calcific atherosclerosis of carotid siphons. Skull: Normal. Negative for fracture or focal lesion. Sinuses/Orbits: Small right maxillary sinus mucous retention cyst. Otherwise negative. Other: None. IMPRESSION: 1. No acute intracranial abnormality identified. 2. Moderate chronic microvascular ischemic changes and moderate parenchymal volume loss of the brain. Small chronic right occipital lobe infarction. Electronically Signed   By: LKristine GarbeM.D.   On: 03/18/2017 22:16   Dg Chest Port 1 View  Result Date: 03/17/2017 CLINICAL DATA:  Shortness of breath. EXAM: PORTABLE CHEST 1 VIEW COMPARISON:  05/22/2016 .  FINDINGS: AICD in stable position. Prior CABG. Stable cardiomegaly. Low lung volumes with bibasilar atelectasis/infiltrate. Persistent left base density consistent with scarring. IMPRESSION: 1. Low lung volumes with bibasilar atelectasis/infiltrates. Persistent left base density consistent with scarring.  2.  AICD in stable position.  Prior CABG.  Stable cardiomegaly. Electronically Signed   By: Marcello Moores  Register   On: 03/17/2017 07:29    Subjective: Seen and examined and was calmer. Wants to go home with Hospice and made up his mind. Wife concerned he wont be able to urinate on his own because of weakness. No other complaints or concerns and wanting to go home.   Discharge Exam: Vitals:   03/21/17 0527 03/21/17 1000  BP: 119/62 115/66  Pulse: 82 88  Resp: 20 (!) 22  Temp: 98.2 F (36.8 C) (!) 97.5 F (36.4 C)  SpO2: 99% 99%   Vitals:   03/20/17 1656 03/20/17 2126 03/21/17 0527 03/21/17 1000  BP: 112/66 124/63 119/62 115/66  Pulse: 86 86 82 88  Resp: _0 (!) 22  Temp: 98 F (36.7 C) 98.5 F (36.9 C) 98.2 F (36.8 C) (!) 97.5 F (36.4 C)  TempSrc: Oral Oral Oral Oral  SpO2: 99% 98% 99% 99%  Weight:  77.4 kg (170 lb 10.2 oz)    Height:       General: Pt is alert, awake, not in acute distress Cardiovascular: Irregularly Irregular, S1/S2 +, no rubs, no gallops Respiratory: Diminished bilaterally, no wheezing, no rhonchi Abdominal: Soft, NT, ND, bowel sounds + Extremities: no edema, no cyanosis  The results of significant diagnostics from this hospitalization (including imaging, microbiology, ancillary and laboratory) are listed below for reference.    Microbiology: Recent Results (from the past 240 hour(s))  Culture, Urine     Status: Abnormal   Collection Time: 03/18/17  2:59 PM  Result Value Ref Range Status   Specimen Description URINE, RANDOM  Final   Special Requests NONE  Final   Culture MULTIPLE SPECIES PRESENT, SUGGEST RECOLLECTION (A)  Final   Report Status  03/19/2017 FINAL  Final    Labs: BNP (last 3 results)  Recent Labs  05/22/16 1206 02/19/17 1004 03/17/17 0808  BNP 273.1* 352.3* 335.4*   Basic Metabolic Panel:  Recent Labs Lab 03/17/17 0807 03/18/17 0456 03/19/17 0319 03/20/17 0939  NA 136 138 139 142  K 3.4* 3.1* 3.2* 4.1  CL 99* 100* 98* 103  CO2 29 30 32 33*  GLUCOSE 131* 137* 113* 109*  BUN _1 21*  CREATININE 1.13 1.18 1.22 1.35*  CALCIUM 8.6* 8.6* 8.9 8.8*  MG  --   --  1.9 2.1  PHOS  --   --  2.6 2.3*   Liver Function Tests:  Recent Labs Lab 03/19/17 0319 03/20/17 0939  AST 26 27  ALT 26 25  ALKPHOS 66 61  BILITOT 1.7* 1.6*  PROT 6.5 5.9*  ALBUMIN 3.8 3.4*   No results for input(s): LIPASE, AMYLASE in the last 168 hours. No results for input(s): AMMONIA in the last 168 hours. CBC:  Recent Labs Lab 03/17/17 0807 03/17/17 1939 03/18/17 0456 03/19/17 0319 03/20/17 0939  WBC 7.6 8.6 7.0 9.4 9.7  NEUTROABS 6.2  --   --  6.9 7.2  HGB 7.7* 8.9* 9.2* 9.8* 9.4*  HCT 26.9* 30.7* 31.7* 33.8* 31.8*  MCV 82.3 81.4 81.7 82.6 83.0  PLT 211 213 195 244 184   Cardiac Enzymes: No results for input(s): CKTOTAL, CKMB, CKMBINDEX, TROPONINI in the last 168 hours. BNP: Invalid input(s): POCBNP CBG:  Recent Labs Lab 03/18/17 0736  GLUCAP 140*   D-Dimer No results for input(s): DDIMER in the last 72 hours. Hgb A1c No results for input(s): HGBA1C in the last  72 hours. Lipid Profile No results for input(s): CHOL, HDL, LDLCALC, TRIG, CHOLHDL, LDLDIRECT in the last 72 hours. Thyroid function studies No results for input(s): TSH, T4TOTAL, T3FREE, THYROIDAB in the last 72 hours.  Invalid input(s): FREET3 Anemia work up No results for input(s): VITAMINB12, FOLATE, FERRITIN, TIBC, IRON, RETICCTPCT in the last 72 hours. Urinalysis    Component Value Date/Time   COLORURINE YELLOW 03/18/2017 [place] 03/18/2017 1454   LABSPEC 1.009 03/18/2017 1454   PHURINE 7.0 03/18/2017 1454    GLUCOSEU NEGATIVE 03/18/2017 1454   HGBUR NEGATIVE 03/18/2017 1454   BILIRUBINUR NEGATIVE 03/18/2017 1454   KETONESUR NEGATIVE 03/18/2017 1454   PROTEINUR NEGATIVE 03/18/2017 1454   UROBILINOGEN 4 (H) 01/17/2013 1605   NITRITE NEGATIVE 03/18/2017 1454   LEUKOCYTESUR NEGATIVE 03/18/2017 1454   Sepsis Labs Invalid input(s): PROCALCITONIN,  WBC,  LACTICIDVEN Microbiology Recent Results (from the past 240 hour(s))  Culture, Urine     Status: Abnormal   Collection Time: 03/18/17  2:59 PM  Result Value Ref Range Status   Specimen Description URINE, RANDOM  Final   Special Requests NONE  Final   Culture MULTIPLE SPECIES PRESENT, SUGGEST RECOLLECTION (A)  Final   Report Status 03/19/2017 FINAL  Final   Time coordinating discharge: 35 minutes  SIGNED:  Kerney Elbe, DO [facility] 03/21/2017, 11:09 AM Pager [phone]  If 7PM-7AM, please contact night-coverage [email or web address] Password [email or web address]

## 2017-03-21 NOTE — Care Management Note (Addendum)
Case Management Note  Patient Details  Name: Dakota Liu MRN: [id number] Date of Birth: 11/26/1927  Subjective/Objective:                 [facility] papers printed off to [place]. Spoke w bedside RN Estill Dooms, instructed to include gold DNR form with these forms and give to [facility] when they arrive. Bedside RN to call [facility] when ready at [phone] to schedule transport home. Please notify [facility] when patient is ready to leave the unit at discharge. (Call [phone] or [phone] after 5pm.)     Action/Plan:  Faxed home oxygen order and DC note to [facility] at [phone]. Spoke with [facility] intake clerk to notify that patient will need home oxygen set up prior to discharge today. Awaiting callback from intake nurse. Addendum: Spoke w Harmon Pier, [facility], she will work on setting up oxygen to be delivered to the home today prior to discharge. Addendum12:28: Oxygen will be at the home today by 3PM. Notified [place] bedside RN who spoke w patient's wife who stated that they wanted to DC ASAP, knowing that patient would not have oxygen for comfort until approx 3pm today.   Expected Discharge Date:  03/21/17               Expected Discharge Plan:  Home w Hospice Care  In-House Referral:  Clinical Social Work  Discharge planning Services  CM Consult  Post Acute Care Choice:    Choice offered to:     DME Arranged:    DME Agency:     HH Arranged:    [place] Agency:     Status of Service:  Completed, signed off  If discussed at [employer], dates discussed:    Additional Comments:  Carles Collet, RN 03/21/2017, 11:27 AM

## 2017-03-21 NOTE — Progress Notes (Signed)
[facility] with RN this morning to make her aware of [facility] AV RN visit this afternoon at [facility]. Chart reviewed at 12:08. Note new request for oxygen at home. Spoke with RNCM Debbie to confirm. Spoke with [place] to place order. Per [facility], oxygen will be delivered by 3 PM. Spouse is contact for delivery at home address and phone number.   Thank you,  Erling Conte, LCSW [phone]

## 2017-03-23 ENCOUNTER — Encounter: Payer: Medicare Other | Admitting: *Deleted

## 2017-03-23 ENCOUNTER — Telehealth: Payer: Self-pay | Admitting: Cardiology

## 2017-03-23 NOTE — Telephone Encounter (Signed)
Attempted to confirm remote transmission with pt. No answer and was unable to leave a message.   

## 2017-03-23 NOTE — Telephone Encounter (Signed)
Spoke to patient, no assistance needed at present time. Close encounter.

## 2017-03-27 ENCOUNTER — Encounter: Payer: Self-pay | Admitting: Cardiology

## 2017-03-31 NOTE — Progress Notes (Signed)
No ICM remote transmission received for 03/23/2017 and next ICM transmission scheduled for 04/20/2017.

## 2017-04-06 ENCOUNTER — Encounter: Payer: Medicare Other | Admitting: Physician Assistant

## 2017-04-06 DEATH — deceased

## 2017-04-07 ENCOUNTER — Other Ambulatory Visit: Payer: Self-pay | Admitting: Cardiovascular Disease

## 2017-04-16 LAB — HEMOGLOBIN AND HEMATOCRIT, BLOOD
HEMATOCRIT: 27 % — AB (ref 37.5–51.0)
Hemoglobin: 7.9 g/dL — ABNORMAL LOW (ref 13.0–17.7)

## 2017-05-22 ENCOUNTER — Ambulatory Visit: Payer: Medicare Other | Admitting: Cardiovascular Disease

## 2017-07-14 IMAGING — DX DG CHEST 2V
2 series · 2 of 2 positions shown · non-contrast
Comparison: PA and lateral chest 05/01/2013.

CLINICAL DATA: Preoperative examination.

EXAM:
CHEST  2 VIEW

[chest pa]
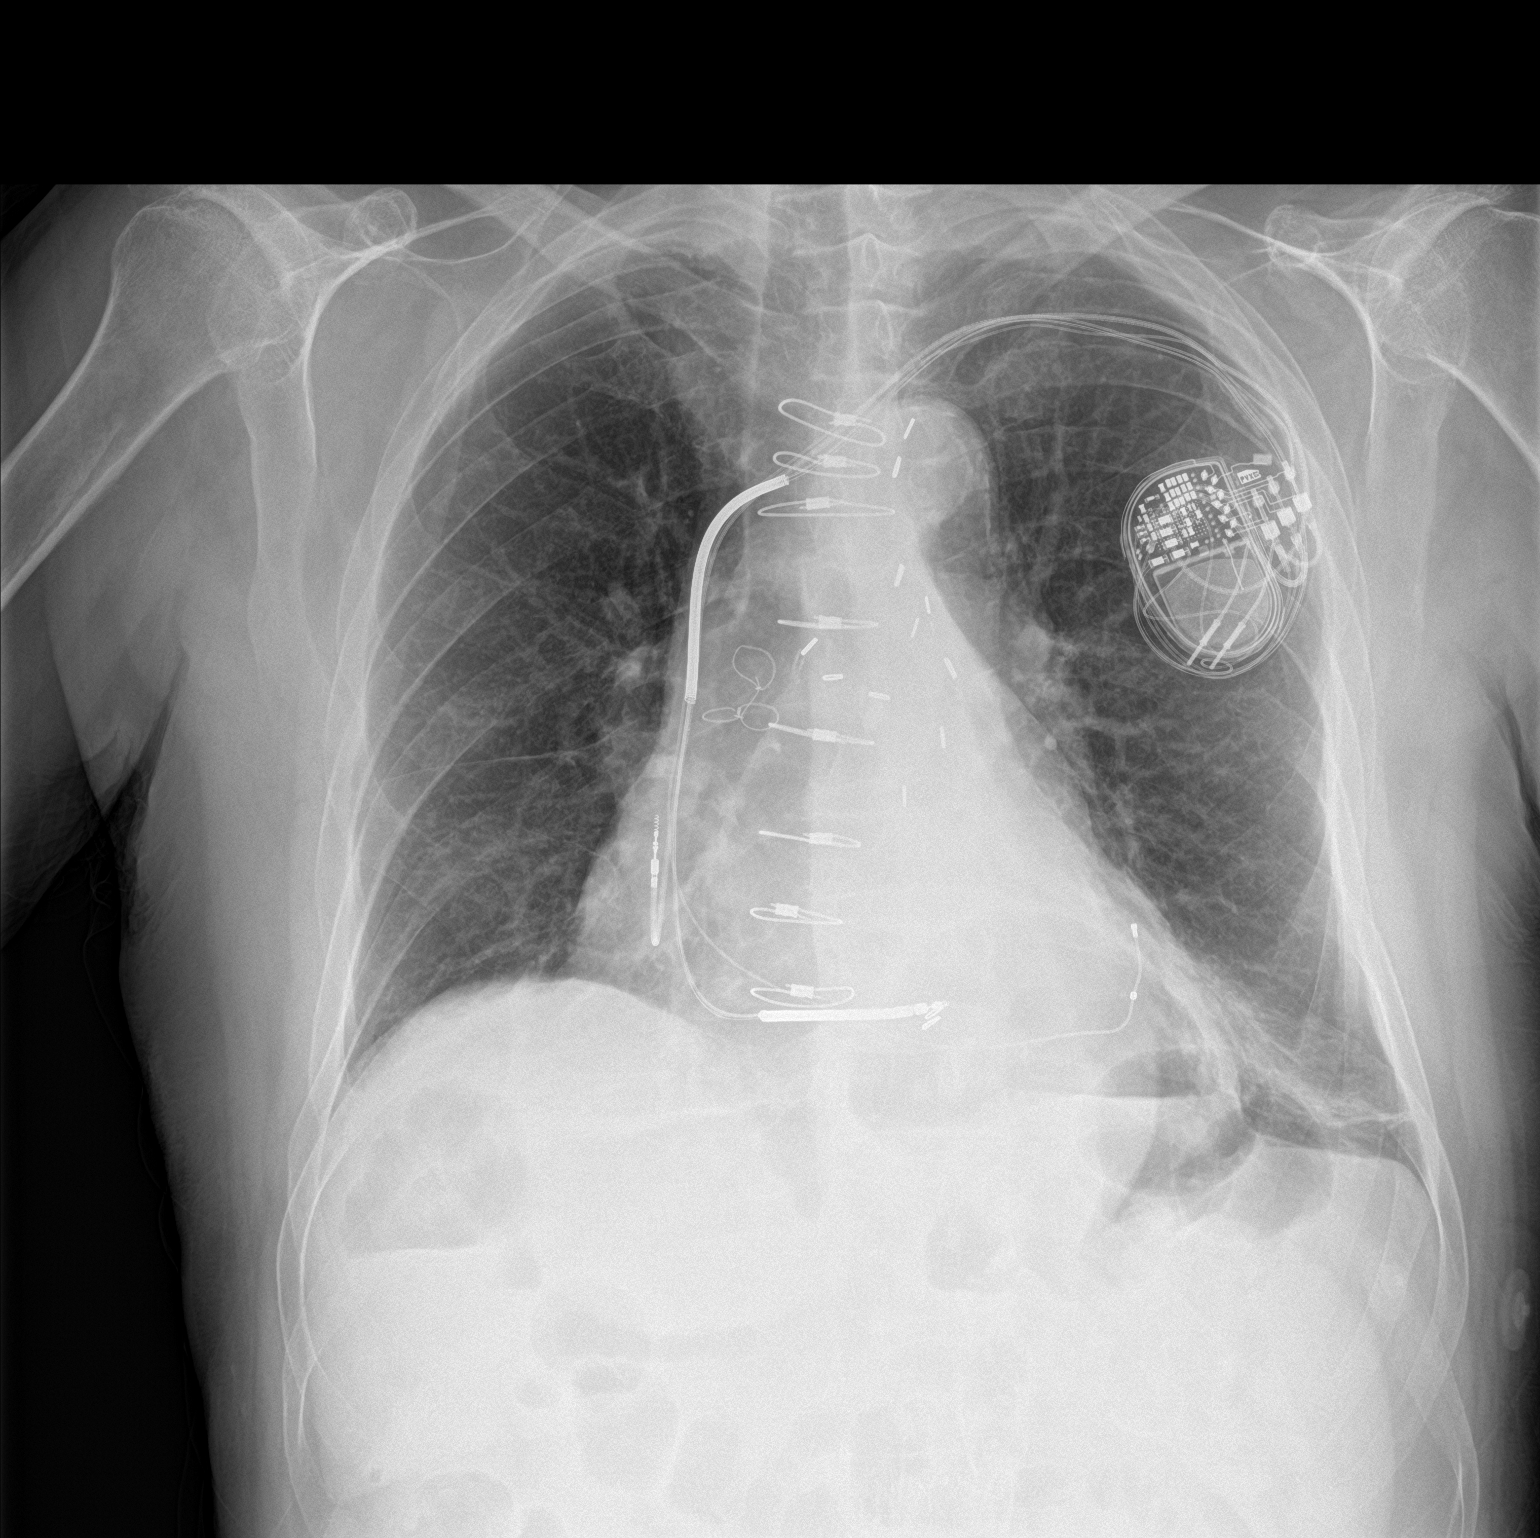

[chest lat]
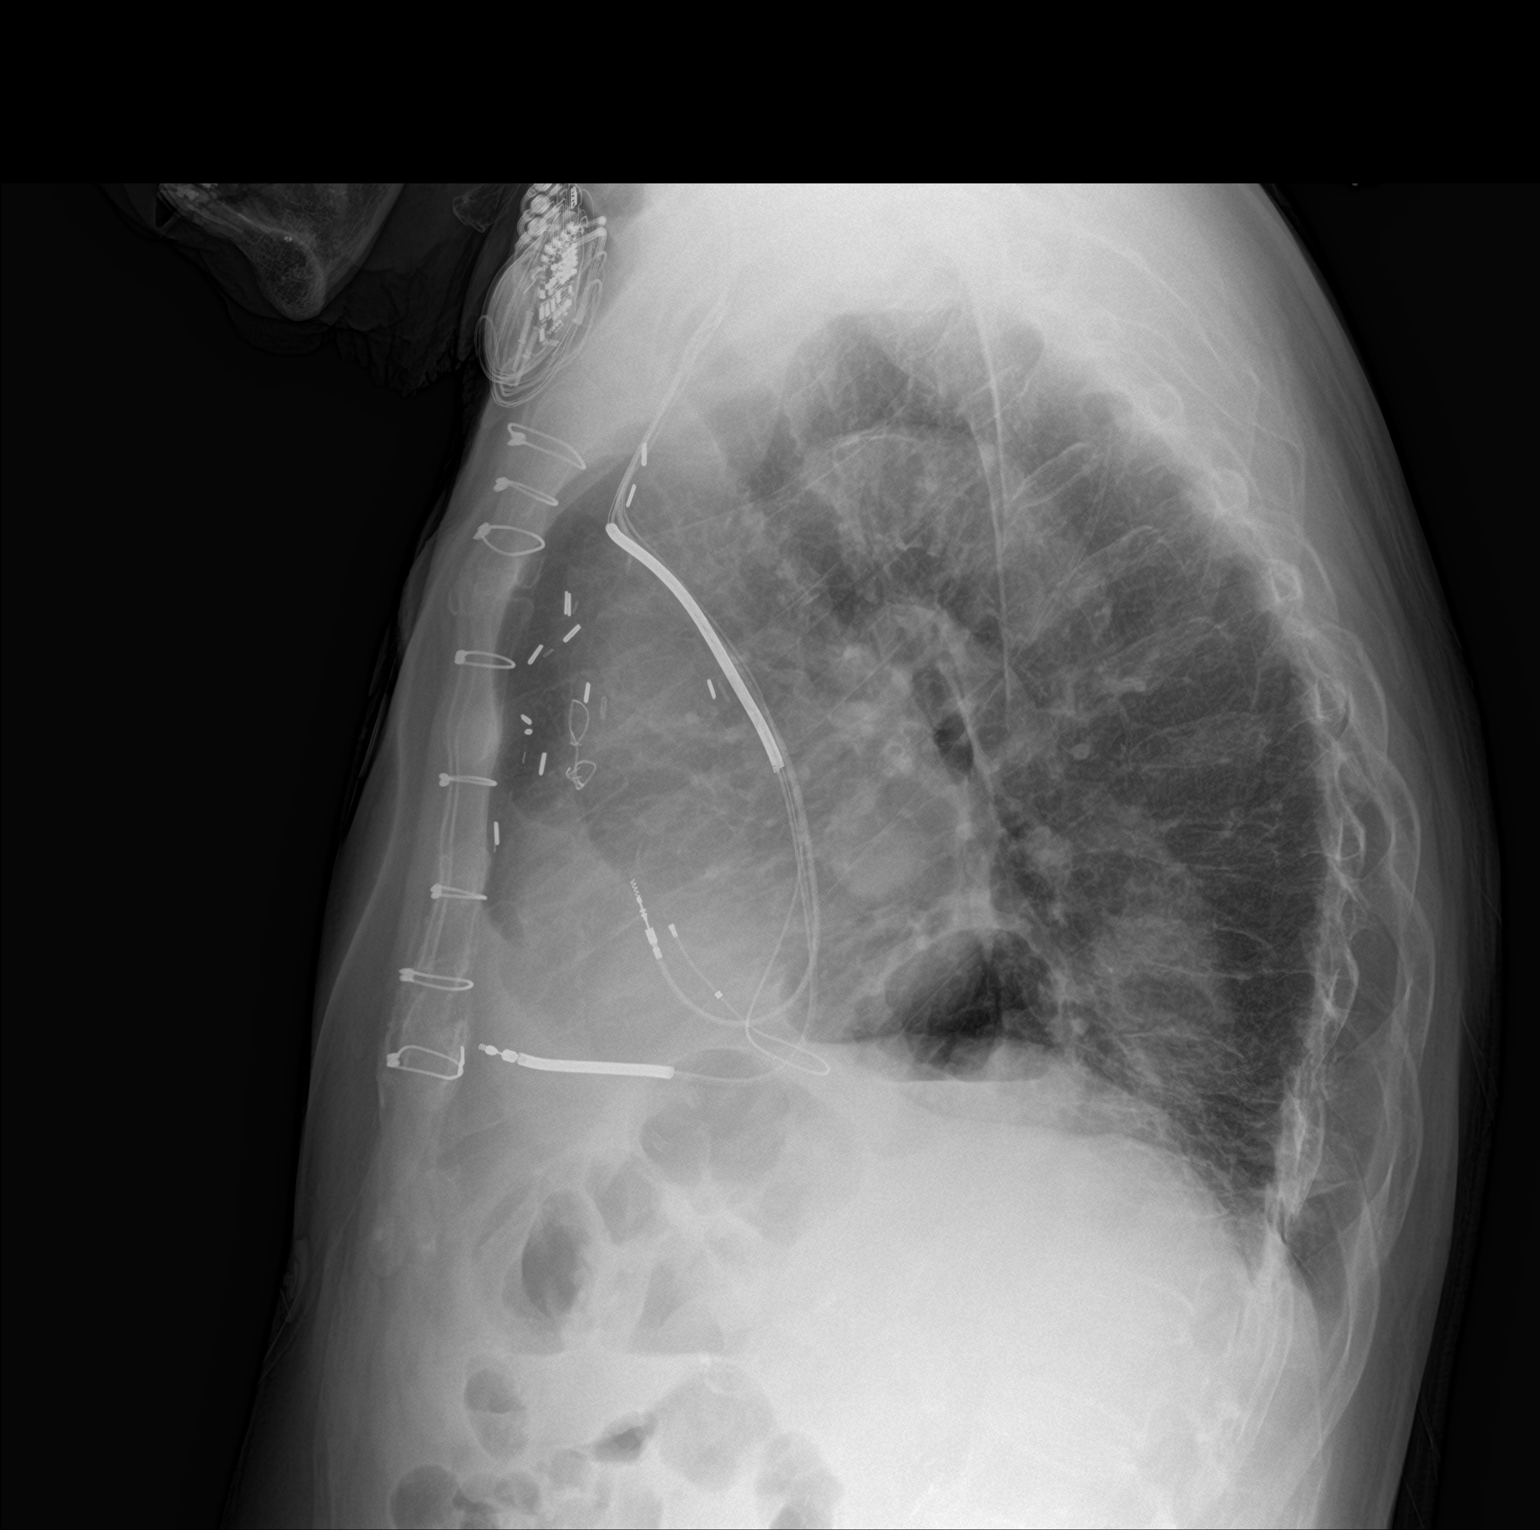

[2 of 2 positions shown; findings below may reference images not displayed]

FINDINGS: AICD is in place, unchanged. Linear atelectasis or scar in the left
lung base is noted. The right lung is clear. Heart size is enlarged.
Aortic atherosclerosis is noted. Hiatal hernia is again seen.
IMPRESSION: No acute disease.

Atherosclerosis.

Hiatal hernia.

## 2017-08-17 LAB — CUP PACEART INCLINIC DEVICE CHECK
Battery Remaining Longevity: 43 mo
Battery Voltage: 2.99 V
Brady Statistic RA Percent Paced: 96.86 %
Brady Statistic RV Percent Paced: 99.79 %
Implantable Lead Implant Date: 20111230
Implantable Lead Location: 753858
Implantable Lead Location: 753860
Implantable Lead Model: 4196
Implantable Lead Model: 5076
Implantable Lead Model: 6947
Lead Channel Impedance Value: 1938 Ohm
Lead Channel Impedance Value: 380 Ohm
Lead Channel Impedance Value: 418 Ohm
Lead Channel Impedance Value: 874 Ohm
Lead Channel Pacing Threshold Amplitude: 1.125 V
Lead Channel Sensing Intrinsic Amplitude: 2 mV
Lead Channel Sensing Intrinsic Amplitude: 2 mV
Lead Channel Sensing Intrinsic Amplitude: 21.75 mV
Lead Channel Setting Pacing Amplitude: 1.5 V
Lead Channel Setting Sensing Sensitivity: 2.8 mV
MDC IDC LEAD IMPLANT DT: 20111230
MDC IDC LEAD IMPLANT DT: 20111230
MDC IDC LEAD LOCATION: 753859
MDC IDC MSMT LEADCHNL LV IMPEDANCE VALUE: 1292 Ohm
MDC IDC MSMT LEADCHNL LV IMPEDANCE VALUE: 1311 Ohm
MDC IDC MSMT LEADCHNL LV IMPEDANCE VALUE: 950 Ohm
MDC IDC MSMT LEADCHNL LV PACING THRESHOLD AMPLITUDE: 3.5 V
MDC IDC MSMT LEADCHNL LV PACING THRESHOLD PULSEWIDTH: 1.5 ms
MDC IDC MSMT LEADCHNL RA IMPEDANCE VALUE: 304 Ohm
MDC IDC MSMT LEADCHNL RA PACING THRESHOLD AMPLITUDE: 0.875 V
MDC IDC MSMT LEADCHNL RA PACING THRESHOLD PULSEWIDTH: 0.4 ms
MDC IDC MSMT LEADCHNL RV IMPEDANCE VALUE: 342 Ohm
MDC IDC MSMT LEADCHNL RV PACING THRESHOLD PULSEWIDTH: 0.4 ms
MDC IDC MSMT LEADCHNL RV SENSING INTR AMPL: 19.375 mV
MDC IDC PG IMPLANT DT: 20170404
MDC IDC SESS DTM: 20180816185107
MDC IDC SET LEADCHNL LV PACING AMPLITUDE: 4.5 V
MDC IDC SET LEADCHNL LV PACING PULSEWIDTH: 1.5 ms
MDC IDC SET LEADCHNL RA PACING AMPLITUDE: 1.5 V
MDC IDC SET LEADCHNL RV PACING PULSEWIDTH: 0.5 ms
MDC IDC STAT BRADY AP VP PERCENT: 96.9 %
MDC IDC STAT BRADY AP VS PERCENT: 0.05 %
MDC IDC STAT BRADY AS VP PERCENT: 3.05 %
MDC IDC STAT BRADY AS VS PERCENT: 0 %
